# Patient Record
Sex: Female | Born: 1955 | Race: White | Hispanic: No | Smoking: Former smoker
Health system: Southern US, Community
[De-identification: ages and names within clinical notes are randomized; demographics above are authoritative.]

## PROBLEM LIST (undated history)

## (undated) DIAGNOSIS — T7840XA Allergy, unspecified, initial encounter: Secondary | ICD-10-CM

## (undated) DIAGNOSIS — F419 Anxiety disorder, unspecified: Secondary | ICD-10-CM

## (undated) DIAGNOSIS — K219 Gastro-esophageal reflux disease without esophagitis: Secondary | ICD-10-CM

## (undated) DIAGNOSIS — E079 Disorder of thyroid, unspecified: Secondary | ICD-10-CM

## (undated) DIAGNOSIS — G4730 Sleep apnea, unspecified: Secondary | ICD-10-CM

## (undated) DIAGNOSIS — J45909 Unspecified asthma, uncomplicated: Secondary | ICD-10-CM

## (undated) DIAGNOSIS — H269 Unspecified cataract: Secondary | ICD-10-CM

## (undated) DIAGNOSIS — J439 Emphysema, unspecified: Secondary | ICD-10-CM

## (undated) DIAGNOSIS — M109 Gout, unspecified: Secondary | ICD-10-CM

## (undated) DIAGNOSIS — E119 Type 2 diabetes mellitus without complications: Secondary | ICD-10-CM

## (undated) DIAGNOSIS — M1990 Unspecified osteoarthritis, unspecified site: Secondary | ICD-10-CM

## (undated) DIAGNOSIS — E039 Hypothyroidism, unspecified: Secondary | ICD-10-CM

## (undated) DIAGNOSIS — Z8719 Personal history of other diseases of the digestive system: Secondary | ICD-10-CM

## (undated) DIAGNOSIS — F32A Depression, unspecified: Secondary | ICD-10-CM

## (undated) DIAGNOSIS — E785 Hyperlipidemia, unspecified: Secondary | ICD-10-CM

## (undated) DIAGNOSIS — S8290XA Unspecified fracture of unspecified lower leg, initial encounter for closed fracture: Secondary | ICD-10-CM

## (undated) DIAGNOSIS — F329 Major depressive disorder, single episode, unspecified: Secondary | ICD-10-CM

## (undated) DIAGNOSIS — J449 Chronic obstructive pulmonary disease, unspecified: Secondary | ICD-10-CM

## (undated) HISTORY — DX: Major depressive disorder, single episode, unspecified: F32.9

## (undated) HISTORY — DX: Sleep apnea, unspecified: G47.30

## (undated) HISTORY — DX: Emphysema, unspecified: J43.9

## (undated) HISTORY — DX: Gout, unspecified: M10.9

## (undated) HISTORY — DX: Anxiety disorder, unspecified: F41.9

## (undated) HISTORY — DX: Depression, unspecified: F32.A

## (undated) HISTORY — DX: Disorder of thyroid, unspecified: E07.9

## (undated) HISTORY — DX: Unspecified osteoarthritis, unspecified site: M19.90

## (undated) HISTORY — DX: Unspecified asthma, uncomplicated: J45.909

## (undated) HISTORY — DX: Unspecified cataract: H26.9

## (undated) HISTORY — DX: Allergy, unspecified, initial encounter: T78.40XA

## (undated) HISTORY — DX: Chronic obstructive pulmonary disease, unspecified: J44.9

## (undated) HISTORY — DX: Hyperlipidemia, unspecified: E78.5

## (undated) HISTORY — DX: Type 2 diabetes mellitus without complications: E11.9

## (undated) HISTORY — DX: Gastro-esophageal reflux disease without esophagitis: K21.9

## (undated) HISTORY — DX: Unspecified fracture of unspecified lower leg, initial encounter for closed fracture: S82.90XA

## (undated) HISTORY — PX: HERNIA REPAIR: SHX51

## (undated) HISTORY — PX: PATELLA FRACTURE SURGERY: SHX735

## (undated) HISTORY — PX: TUBAL LIGATION: SHX77

---

## 1998-10-08 HISTORY — PX: ABDOMINAL HYSTERECTOMY: SHX81

## 2004-08-16 ENCOUNTER — Ambulatory Visit: Payer: Self-pay

## 2004-08-30 ENCOUNTER — Ambulatory Visit: Payer: Self-pay

## 2005-10-04 ENCOUNTER — Ambulatory Visit: Payer: Self-pay | Admitting: Internal Medicine

## 2005-10-08 DIAGNOSIS — S8290XA Unspecified fracture of unspecified lower leg, initial encounter for closed fracture: Secondary | ICD-10-CM

## 2005-10-08 HISTORY — DX: Unspecified fracture of unspecified lower leg, initial encounter for closed fracture: S82.90XA

## 2005-10-08 HISTORY — PX: LEG SURGERY: SHX1003

## 2006-06-25 ENCOUNTER — Emergency Department: Payer: Self-pay | Admitting: Emergency Medicine

## 2006-07-01 ENCOUNTER — Ambulatory Visit: Payer: Self-pay | Admitting: General Practice

## 2006-07-04 ENCOUNTER — Inpatient Hospital Stay: Payer: Self-pay | Admitting: General Practice

## 2007-09-18 ENCOUNTER — Ambulatory Visit: Payer: Self-pay | Admitting: Specialist

## 2007-11-25 ENCOUNTER — Ambulatory Visit: Payer: Self-pay

## 2008-12-09 ENCOUNTER — Ambulatory Visit: Payer: Self-pay | Admitting: Specialist

## 2010-04-04 ENCOUNTER — Ambulatory Visit: Payer: Self-pay

## 2010-05-30 ENCOUNTER — Ambulatory Visit: Payer: Self-pay | Admitting: Adult Health

## 2010-06-06 ENCOUNTER — Ambulatory Visit: Payer: Self-pay | Admitting: Cardiovascular Disease

## 2010-09-27 ENCOUNTER — Ambulatory Visit: Payer: Self-pay | Admitting: Adult Health

## 2011-04-12 ENCOUNTER — Ambulatory Visit: Payer: Self-pay | Admitting: "Endocrinology

## 2011-04-12 DIAGNOSIS — R0602 Shortness of breath: Secondary | ICD-10-CM

## 2011-04-19 ENCOUNTER — Ambulatory Visit: Payer: Self-pay | Admitting: "Endocrinology

## 2011-05-15 ENCOUNTER — Ambulatory Visit: Payer: Self-pay

## 2012-05-20 ENCOUNTER — Ambulatory Visit: Payer: Self-pay

## 2013-04-18 ENCOUNTER — Emergency Department: Payer: Self-pay | Admitting: Emergency Medicine

## 2013-04-18 LAB — COMPREHENSIVE METABOLIC PANEL
Albumin: 3.8 g/dL (ref 3.4–5.0)
Calcium, Total: 8.8 mg/dL (ref 8.5–10.1)
Creatinine: 1.21 mg/dL (ref 0.60–1.30)
EGFR (African American): 59 — ABNORMAL LOW
EGFR (Non-African Amer.): 51 — ABNORMAL LOW
Glucose: 152 mg/dL — ABNORMAL HIGH (ref 65–99)
SGOT(AST): 31 U/L (ref 15–37)
SGPT (ALT): 25 U/L (ref 12–78)
Sodium: 136 mmol/L (ref 136–145)
Total Protein: 7.8 g/dL (ref 6.4–8.2)

## 2013-04-18 LAB — CK TOTAL AND CKMB (NOT AT ARMC)
CK, Total: 351 U/L — ABNORMAL HIGH (ref 21–215)
CK-MB: 3.1 ng/mL (ref 0.5–3.6)

## 2013-04-18 LAB — CBC
HGB: 13.8 g/dL (ref 12.0–16.0)
MCH: 30.9 pg (ref 26.0–34.0)
MCV: 91 fL (ref 80–100)
Platelet: 241 10*3/uL (ref 150–440)

## 2013-10-12 ENCOUNTER — Ambulatory Visit: Payer: Self-pay

## 2013-10-20 ENCOUNTER — Ambulatory Visit: Payer: Self-pay

## 2014-04-19 ENCOUNTER — Ambulatory Visit: Payer: Self-pay

## 2014-06-29 DIAGNOSIS — G4730 Sleep apnea, unspecified: Secondary | ICD-10-CM | POA: Insufficient documentation

## 2014-08-12 ENCOUNTER — Emergency Department: Payer: Self-pay | Admitting: Student

## 2014-08-13 ENCOUNTER — Emergency Department: Payer: Self-pay | Admitting: Emergency Medicine

## 2014-08-31 LAB — CBC AND DIFFERENTIAL
HEMATOCRIT: 44 % (ref 36–46)
HEMOGLOBIN: 14.8 g/dL (ref 12.0–16.0)
Neutrophils Absolute: 4 /uL
Platelets: 340 10*3/uL (ref 150–399)
WBC: 8.3 10*3/mL

## 2014-08-31 LAB — HEPATIC FUNCTION PANEL
ALT: 17 U/L (ref 7–35)
AST: 22 U/L (ref 13–35)
Alkaline Phosphatase: 83 U/L (ref 25–125)
BILIRUBIN DIRECT: 0.1 mg/dL (ref 0.01–0.4)
BILIRUBIN, TOTAL: 0.2 mg/dL

## 2014-08-31 LAB — BASIC METABOLIC PANEL
BUN: 10 mg/dL (ref 4–21)
CREATININE: 0.8 mg/dL (ref 0.5–1.1)
GLUCOSE: 83 mg/dL
POTASSIUM: 4.3 mmol/L (ref 3.4–5.3)
SODIUM: 139 mmol/L (ref 137–147)

## 2014-08-31 LAB — LIPID PANEL
Cholesterol: 364 mg/dL — AB (ref 0–200)
HDL: 54 mg/dL (ref 35–70)
Triglycerides: 472 mg/dL — AB (ref 40–160)

## 2014-08-31 LAB — TSH: TSH: 59.6 u[IU]/mL — AB (ref 0.41–5.90)

## 2014-08-31 LAB — HEMOGLOBIN A1C: HEMOGLOBIN A1C: 6.5

## 2014-09-14 DIAGNOSIS — E119 Type 2 diabetes mellitus without complications: Secondary | ICD-10-CM | POA: Insufficient documentation

## 2014-10-28 DIAGNOSIS — L821 Other seborrheic keratosis: Secondary | ICD-10-CM | POA: Insufficient documentation

## 2014-11-11 DIAGNOSIS — E039 Hypothyroidism, unspecified: Secondary | ICD-10-CM | POA: Insufficient documentation

## 2014-11-11 DIAGNOSIS — M109 Gout, unspecified: Secondary | ICD-10-CM | POA: Insufficient documentation

## 2014-11-11 DIAGNOSIS — J449 Chronic obstructive pulmonary disease, unspecified: Secondary | ICD-10-CM | POA: Insufficient documentation

## 2014-12-09 DIAGNOSIS — M545 Low back pain, unspecified: Secondary | ICD-10-CM | POA: Insufficient documentation

## 2014-12-09 DIAGNOSIS — M549 Dorsalgia, unspecified: Secondary | ICD-10-CM | POA: Insufficient documentation

## 2015-12-13 ENCOUNTER — Telehealth: Payer: Self-pay

## 2015-12-13 NOTE — Telephone Encounter (Signed)
[facility] requesting refill for allopurinol  And Naproxen. Unable to authorize refill due to patein has not been seen at clinic in over a year. Patient needs to update eligibility and  then  schedule an appointment.

## 2015-12-15 NOTE — Telephone Encounter (Signed)
Lm letting know she must be seen before we can authorize refill

## 2016-02-21 DIAGNOSIS — E119 Type 2 diabetes mellitus without complications: Secondary | ICD-10-CM

## 2016-02-21 DIAGNOSIS — J449 Chronic obstructive pulmonary disease, unspecified: Secondary | ICD-10-CM

## 2016-02-21 DIAGNOSIS — M545 Low back pain: Secondary | ICD-10-CM

## 2016-02-21 DIAGNOSIS — E039 Hypothyroidism, unspecified: Secondary | ICD-10-CM

## 2016-02-21 DIAGNOSIS — M109 Gout, unspecified: Secondary | ICD-10-CM

## 2016-02-21 DIAGNOSIS — L821 Other seborrheic keratosis: Secondary | ICD-10-CM

## 2016-02-21 DIAGNOSIS — G4730 Sleep apnea, unspecified: Secondary | ICD-10-CM

## 2016-06-14 ENCOUNTER — Telehealth: Payer: Self-pay | Admitting: Nurse Practitioner

## 2016-06-14 NOTE — Telephone Encounter (Signed)
Unable to make contact with patient to inform her that [facility], due to a wrong number.

## 2016-06-19 ENCOUNTER — Ambulatory Visit: Payer: Self-pay | Admitting: Nurse Practitioner

## 2016-06-19 VITALS — BP 125/80 | HR 72 | Temp 91.6°F | Ht 61.0 in | Wt 186.5 lb

## 2016-06-19 DIAGNOSIS — R5382 Chronic fatigue, unspecified: Secondary | ICD-10-CM

## 2016-06-19 DIAGNOSIS — E782 Mixed hyperlipidemia: Secondary | ICD-10-CM

## 2016-06-19 DIAGNOSIS — R7309 Other abnormal glucose: Secondary | ICD-10-CM

## 2016-06-19 DIAGNOSIS — E039 Hypothyroidism, unspecified: Secondary | ICD-10-CM

## 2016-06-19 MED ORDER — ALBUTEROL SULFATE HFA 108 (90 BASE) MCG/ACT IN AERS: 2.0000 | INHALATION_SPRAY | Freq: Four times a day (QID) | RESPIRATORY_TRACT | 1 refills | Status: DC | PRN

## 2016-06-19 MED ORDER — FLUTICASONE-SALMETEROL 100-50 MCG/DOSE IN AEPB: 1.0000 | INHALATION_SPRAY | Freq: Two times a day (BID) | RESPIRATORY_TRACT | 1 refills | Status: DC

## 2016-06-19 MED ORDER — LEVOTHYROXINE SODIUM 200 MCG PO TABS: 200.0000 ug | ORAL_TABLET | Freq: Every day | ORAL | 1 refills | Status: DC

## 2016-06-19 MED ORDER — NAPROXEN 250 MG PO TABS: 250.0000 mg | ORAL_TABLET | Freq: Two times a day (BID) | ORAL | 0 refills | Status: DC

## 2016-06-19 NOTE — Progress Notes (Signed)
   Subjective:    Patient ID: Amy Knapp, female    DOB: [birth year], 58 y.o.   MRN: [id number]  HPI  Pt presents for f/u with hypothyroidism. Reports ran out of meds 3 months ago. Pt reports smoking 4 packs/day. Pt reports lower back pain from injury 2 years ago. Pt reports wheezing at night. Reports ran out of inhaler meds.   Patient Active Problem List   Diagnosis Date Noted  . Lower back pain 12/09/2014  . Hypothyroidism 11/11/2014  . COPD (chronic obstructive pulmonary disease) ([place]) 11/11/2014  . Gout 11/11/2014  . Seborrheic keratosis 10/28/2014  . Diabetes ([place]) 09/14/2014  . Sleep apnea 06/29/2014     Medication List       Accurate as of 06/19/16  8:17 PM. Always use your most recent med list.          Fluticasone-Salmeterol 100-50 MCG/DOSE Aepb Commonly known as:  ADVAIR Inhale 1 puff into the lungs 2 (two) times daily.   levothyroxine 200 MCG tablet Commonly known as:  SYNTHROID, LEVOTHROID Take 200 mcg by mouth daily before breakfast.   naproxen 250 MG tablet Commonly known as:  NAPROSYN Take by mouth 2 (two) times daily with a meal.   simvastatin 20 MG tablet Commonly known as:  ZOCOR Take 20 mg by mouth daily.   VENTOLIN HFA 108 (90 Base) MCG/ACT inhaler Generic drug:  albuterol Inhale 2 puffs into the lungs every 6 (six) hours as needed for wheezing or shortness of breath.         Review of Systems   No adenopathy  Carotid bruits not present BBrS clear though diminished as expected in long time smoker APRR trace to 1+ pitting edema ending below 1 shin level    Objective:   Physical Exam  Constitutional: She is oriented to person, place, and time.  Cardiovascular: Normal rate, regular rhythm and normal heart sounds.   Neurological: She is alert and oriented to person, place, and time.    BP 125/80   Pulse 72   Temp (!) 91.6 F (33.1 C) (Oral)   Ht _0  (1.549 m)   Wt 186 lb 8 oz (84.6 kg)   BMI 35.24 kg/m         Assessment & Plan:  F/u in 2 weeks w/ labs done prior Labs ASAP: TSH, Lipid, A1C, Met C, CBC Likely prescribe Lipitor Referral to orthopedics and eye exam

## 2016-06-19 NOTE — Patient Instructions (Signed)
F/u in 2 weeks w/ labs done prior Labs ASAP: TSH, Lipid, A1C, Met C, CBC Referral to orthopedics and for eye exam

## 2016-06-21 ENCOUNTER — Other Ambulatory Visit: Payer: Self-pay

## 2016-06-21 DIAGNOSIS — E782 Mixed hyperlipidemia: Secondary | ICD-10-CM

## 2016-06-21 DIAGNOSIS — E039 Hypothyroidism, unspecified: Secondary | ICD-10-CM

## 2016-06-21 DIAGNOSIS — R5382 Chronic fatigue, unspecified: Secondary | ICD-10-CM

## 2016-06-21 DIAGNOSIS — R7309 Other abnormal glucose: Secondary | ICD-10-CM

## 2016-06-23 LAB — CBC WITH DIFFERENTIAL/PLATELET
BASOS: 1 %
Basophils Absolute: 0 10*3/uL (ref 0.0–0.2)
EOS (ABSOLUTE): 0.1 10*3/uL (ref 0.0–0.4)
Eos: 2 %
Hematocrit: 40 % (ref 34.0–46.6)
Hemoglobin: 13.5 g/dL (ref 11.1–15.9)
Immature Grans (Abs): 0 10*3/uL (ref 0.0–0.1)
Immature Granulocytes: 0 %
LYMPHS ABS: 3.5 10*3/uL — AB (ref 0.7–3.1)
Lymphs: 52 %
MCH: 30.6 pg (ref 26.6–33.0)
MCHC: 33.8 g/dL (ref 31.5–35.7)
MCV: 91 fL (ref 79–97)
MONOS ABS: 0.5 10*3/uL (ref 0.1–0.9)
Monocytes: 7 %
NEUTROS ABS: 2.5 10*3/uL (ref 1.4–7.0)
Neutrophils: 38 %
PLATELETS: 283 10*3/uL (ref 150–379)
RBC: 4.41 x10E6/uL (ref 3.77–5.28)
RDW: 13.9 % (ref 12.3–15.4)
WBC: 6.7 10*3/uL (ref 3.4–10.8)

## 2016-06-23 LAB — COMPREHENSIVE METABOLIC PANEL
A/G RATIO: 1.7 (ref 1.2–2.2)
ALT: 25 IU/L (ref 0–32)
AST: 48 IU/L — AB (ref 0–40)
Albumin: 4.6 g/dL (ref 3.5–5.5)
Alkaline Phosphatase: 82 IU/L (ref 39–117)
BILIRUBIN TOTAL: 0.3 mg/dL (ref 0.0–1.2)
BUN/Creatinine Ratio: 13 (ref 9–23)
BUN: 14 mg/dL (ref 6–24)
CHLORIDE: 97 mmol/L (ref 96–106)
CO2: 26 mmol/L (ref 18–29)
Calcium: 9.6 mg/dL (ref 8.7–10.2)
Creatinine, Ser: 1.04 mg/dL — ABNORMAL HIGH (ref 0.57–1.00)
GFR calc non Af Amer: 60 mL/min/{1.73_m2} (ref >59)
GFR, EST AFRICAN AMERICAN: 69 mL/min/{1.73_m2} (ref >59)
Globulin, Total: 2.7 g/dL (ref 1.5–4.5)
Glucose: 88 mg/dL (ref 65–99)
POTASSIUM: 4.1 mmol/L (ref 3.5–5.2)
Sodium: 141 mmol/L (ref 134–144)
TOTAL PROTEIN: 7.3 g/dL (ref 6.0–8.5)

## 2016-06-23 LAB — LIPID PANEL
Chol/HDL Ratio: 6.3 ratio units — ABNORMAL HIGH (ref 0.0–4.4)
Cholesterol, Total: 480 mg/dL — ABNORMAL HIGH (ref 100–199)
HDL: 76 mg/dL (ref >39)
LDL Calculated: 358 mg/dL — ABNORMAL HIGH (ref 0–99)
Triglycerides: 229 mg/dL — ABNORMAL HIGH (ref 0–149)
VLDL Cholesterol Cal: 46 mg/dL — ABNORMAL HIGH (ref 5–40)

## 2016-06-23 LAB — HEMOGLOBIN A1C
ESTIMATED AVERAGE GLUCOSE: 131 mg/dL
HEMOGLOBIN A1C: 6.2 % — AB (ref 4.8–5.6)

## 2016-06-23 LAB — TSH: TSH: 71.88 u[IU]/mL — ABNORMAL HIGH (ref 0.450–4.500)

## 2016-07-03 ENCOUNTER — Ambulatory Visit: Payer: Self-pay | Admitting: Nurse Practitioner

## 2016-07-03 VITALS — BP 139/69 | HR 84 | Temp 98.7°F | Ht 61.0 in | Wt 185.0 lb

## 2016-07-03 DIAGNOSIS — E782 Mixed hyperlipidemia: Secondary | ICD-10-CM

## 2016-07-03 DIAGNOSIS — E039 Hypothyroidism, unspecified: Secondary | ICD-10-CM

## 2016-07-03 DIAGNOSIS — R7303 Prediabetes: Secondary | ICD-10-CM

## 2016-07-03 LAB — GLUCOSE, POCT (MANUAL RESULT ENTRY): POC GLUCOSE: 90 mg/dL (ref 70–99)

## 2016-07-03 MED ORDER — ATORVASTATIN CALCIUM 20 MG PO TABS: 20.0000 mg | ORAL_TABLET | Freq: Every day | ORAL | 3 refills | Status: DC

## 2016-07-03 MED ORDER — ALLOPURINOL 100 MG PO TABS: 100.0000 mg | ORAL_TABLET | Freq: Every day | ORAL | 3 refills | Status: DC

## 2016-07-03 MED ORDER — FLUTICASONE-SALMETEROL 100-50 MCG/DOSE IN AEPB: 1.0000 | INHALATION_SPRAY | Freq: Two times a day (BID) | RESPIRATORY_TRACT | 1 refills | Status: DC

## 2016-07-03 MED ORDER — HYDROCHLOROTHIAZIDE 12.5 MG PO CAPS: 12.5000 mg | ORAL_CAPSULE | Freq: Every day | ORAL | 3 refills | Status: DC

## 2016-07-03 MED ORDER — ALBUTEROL SULFATE HFA 108 (90 BASE) MCG/ACT IN AERS: 2.0000 | INHALATION_SPRAY | Freq: Four times a day (QID) | RESPIRATORY_TRACT | 1 refills | Status: DC | PRN

## 2016-07-03 NOTE — Progress Notes (Signed)
Upon review of chart, noted total cholesterol at 400, and LDL at 358  Pt states she had been out of medication for approx 3.5 months.    Had been on simvastatin, 20 mg,    Is concerned Re;  BLE edema, is working 58 hours per week [employer], mostly standing, and has had significant lower extremity edema.    Exam:   L upper back, large seborrheic kerotsis  Thyroid is tender to poalpation No adenopathy Carotid bruits not present  BBrS clear AP RRR BLE, with approx 2+ edema, ending below the knee.    Assessment/plan  In 6 weeks will recheck liver and renal panel to make sure no problems from then atorvastatin, and as creatinine was slightly elevated, will recheck.  Renal panel  Blood count ok,   tsh at approx 72, but just restarted her levothyroxine approx 2 weeks ago, so will recheck tsh at 6 week point.    Have instructed pt it is unacceptable to run out of her meds, instructed she must come to clinic or call at least 3-4 weeks before her meds are out.    Discussed risk of chf, with not taking levothyroxine, and risk of heart attack or stroke if lipids are out of control  Seborrheic keratosis, pt reassured benign and will never become malignant.    BLE edema will add 12.5 mg of hctz, but pt really needs support socks or stockings.    Gout, will renew her allopurinol

## 2016-07-03 NOTE — Patient Instructions (Addendum)
Pt is to have labs drawn in 6 weeks, and rtc to clinic after results are available.

## 2016-07-10 ENCOUNTER — Telehealth: Payer: Self-pay

## 2016-07-10 NOTE — Telephone Encounter (Signed)
Need to reschedule eye appt on Thursday. No number in computer.

## 2016-07-12 ENCOUNTER — Ambulatory Visit: Payer: Self-pay | Admitting: Ophthalmology

## 2016-08-02 ENCOUNTER — Ambulatory Visit: Payer: Self-pay | Admitting: Ophthalmology

## 2016-08-14 ENCOUNTER — Ambulatory Visit: Payer: Self-pay | Admitting: Adult Health Nurse Practitioner

## 2016-08-14 VITALS — BP 126/69 | HR 82 | Temp 97.5°F | Wt 181.0 lb

## 2016-08-14 DIAGNOSIS — E782 Mixed hyperlipidemia: Secondary | ICD-10-CM

## 2016-08-14 DIAGNOSIS — E785 Hyperlipidemia, unspecified: Secondary | ICD-10-CM | POA: Insufficient documentation

## 2016-08-14 DIAGNOSIS — E1169 Type 2 diabetes mellitus with other specified complication: Secondary | ICD-10-CM | POA: Insufficient documentation

## 2016-08-14 DIAGNOSIS — E039 Hypothyroidism, unspecified: Secondary | ICD-10-CM

## 2016-08-14 DIAGNOSIS — M545 Low back pain: Secondary | ICD-10-CM

## 2016-08-14 DIAGNOSIS — R600 Localized edema: Secondary | ICD-10-CM | POA: Insufficient documentation

## 2016-08-14 MED ORDER — NAPROXEN 250 MG PO TABS: 250.0000 mg | ORAL_TABLET | Freq: Two times a day (BID) | ORAL | 0 refills | Status: DC

## 2016-08-14 MED ORDER — LEVOTHYROXINE SODIUM 200 MCG PO TABS: 200.0000 ug | ORAL_TABLET | Freq: Every day | ORAL | 1 refills | Status: DC

## 2016-08-14 NOTE — Progress Notes (Signed)
Patient: Amy Knapp Female    DOB: [birth year]   60 y.o.   MRN: [id number] Visit Date: 08/14/2016  Today's Provider: [facility]   Chief Complaint  Patient presents with  . Back Pain    R. lower back pain, doesnt not remember trauma, job is lots of standing  . Follow-up   Subjective:    HPI  R lower back pain, denies trauma.  Just started today.  Has not taken anything for it.  Pain rated 5/10-thinks could be related to standing at work.   Last visit HCTZ was added for BLE.  Has noticed some decreased swelling.  Not wearing support hose.     Reports taking levothyroxine as directed.  Needs to repeat TSH today.   Started on Atorvastatin at last OV. Reports taking as directed.  Will check LFT for stability.  Denies myalgias.   Allergies  Allergen Reactions  . Celery Oil   . Demerol [Meperidine]   . Morphine And Related Other (See Comments)    Muscle Spasms  . Penicillins    Previous Medications   ALBUTEROL (VENTOLIN HFA) 108 (90 BASE) MCG/ACT INHALER    Inhale 2 puffs into the lungs every 6 (six) hours as needed for wheezing or shortness of breath.   ALLOPURINOL (ZYLOPRIM) 100 MG TABLET    Take 1 tablet (100 mg total) by mouth daily.   ATORVASTATIN (LIPITOR) 20 MG TABLET    Take 1 tablet (20 mg total) by mouth daily.   FLUTICASONE-SALMETEROL (ADVAIR) 100-50 MCG/DOSE AEPB    Inhale 1 puff into the lungs 2 (two) times daily.   HYDROCHLOROTHIAZIDE (MICROZIDE) 12.5 MG CAPSULE    Take 1 capsule (12.5 mg total) by mouth daily.   LEVOTHYROXINE (SYNTHROID, LEVOTHROID) 200 MCG TABLET    Take 1 tablet (200 mcg total) by mouth daily before breakfast.   NAPROXEN (NAPROSYN) 250 MG TABLET    Take 1 tablet (250 mg total) by mouth 2 (two) times daily with a meal.    Review of Systems  All other systems reviewed and are negative.   Social History  Substance Use Topics  . Smoking status: Current Every Day Smoker    Packs/day: 0.25    Years: 15.00   Types: Cigarettes  . Smokeless tobacco: Never Used  . Alcohol use No   Objective:   BP 126/69   Pulse 82   Temp 97.5 F (36.4 C)   Wt 181 lb (82.1 kg)   SpO2 93%   BMI 34.20 kg/m   Physical Exam  Constitutional: She is oriented to person, place, and time. She appears well-developed and well-nourished.  HENT:  Head: Normocephalic and atraumatic.  Eyes: Pupils are equal, round, and reactive to light.  Neck: Normal range of motion. Neck supple.  Cardiovascular: Normal rate, regular rhythm and normal heart sounds.   Trace BLE edema.   Pulmonary/Chest: Effort normal and breath sounds normal.  Abdominal: Soft. Bowel sounds are normal.  Neurological: She is oriented to person, place, and time.  Skin: Skin is warm and dry.  Psychiatric: She has a normal mood and affect. Her behavior is normal.  Vitals reviewed.       Assessment & Plan:          Check TSH Refill levothyroxine and naproxen.   LBP:  Take Naproxen tonight.  Moist heat.  Stretch back muscles.  BLE edema:  Recommend support hose.  Elevate legs when sitting or lying.  Continue HCTZ 12.5.  HLD:  Continue Atorvastatin.  Monitor lipids at next ov.  Check LFT today.  FU in 3 months for routine visit.    [facility]   [facility]

## 2016-08-16 ENCOUNTER — Other Ambulatory Visit: Payer: Self-pay

## 2016-08-16 DIAGNOSIS — E782 Mixed hyperlipidemia: Secondary | ICD-10-CM

## 2016-08-17 LAB — COMPREHENSIVE METABOLIC PANEL
A/G RATIO: 1.4 (ref 1.2–2.2)
ALBUMIN: 3.7 g/dL (ref 3.5–5.5)
ALT: 35 IU/L — AB (ref 0–32)
AST: 30 IU/L (ref 0–40)
Alkaline Phosphatase: 73 IU/L (ref 39–117)
BUN/Creatinine Ratio: 13 (ref 9–23)
BUN: 11 mg/dL (ref 6–24)
Bilirubin Total: 0.3 mg/dL (ref 0.0–1.2)
CALCIUM: 9.8 mg/dL (ref 8.7–10.2)
CO2: 31 mmol/L — ABNORMAL HIGH (ref 18–29)
Chloride: 102 mmol/L (ref 96–106)
Creatinine, Ser: 0.85 mg/dL (ref 0.57–1.00)
GFR, EST AFRICAN AMERICAN: 88 mL/min/{1.73_m2} (ref >59)
GFR, EST NON AFRICAN AMERICAN: 76 mL/min/{1.73_m2} (ref >59)
GLOBULIN, TOTAL: 2.6 g/dL (ref 1.5–4.5)
Glucose: 121 mg/dL — ABNORMAL HIGH (ref 65–99)
POTASSIUM: 3.8 mmol/L (ref 3.5–5.2)
SODIUM: 145 mmol/L — AB (ref 134–144)
TOTAL PROTEIN: 6.3 g/dL (ref 6.0–8.5)

## 2016-08-17 LAB — LIPID PANEL
CHOLESTEROL TOTAL: 146 mg/dL (ref 100–199)
Chol/HDL Ratio: 3 ratio units (ref 0.0–4.4)
HDL: 48 mg/dL (ref >39)
LDL Calculated: 62 mg/dL (ref 0–99)
TRIGLYCERIDES: 182 mg/dL — AB (ref 0–149)
VLDL Cholesterol Cal: 36 mg/dL (ref 5–40)

## 2016-08-17 LAB — HEMOGLOBIN A1C
Est. average glucose Bld gHb Est-mCnc: 120 mg/dL
Hgb A1c MFr Bld: 5.8 % — ABNORMAL HIGH (ref 4.8–5.6)

## 2016-08-22 ENCOUNTER — Telehealth: Payer: Self-pay

## 2016-08-22 NOTE — Telephone Encounter (Signed)
No phone number available to call pt. Will send letter.

## 2016-09-20 ENCOUNTER — Telehealth: Payer: Self-pay

## 2016-09-20 NOTE — Telephone Encounter (Signed)
Pt called in wanting a refill on her thyroid medication levothyroxine. I looked it up and she has another refill, [facility]. Tried calling number just rang no answer.

## 2016-10-16 ENCOUNTER — Other Ambulatory Visit: Payer: Self-pay | Admitting: Nurse Practitioner

## 2016-11-01 ENCOUNTER — Other Ambulatory Visit: Payer: Self-pay

## 2016-11-01 MED ORDER — NAPROXEN 250 MG PO TABS: 250.0000 mg | ORAL_TABLET | Freq: Two times a day (BID) | ORAL | 0 refills | Status: DC

## 2016-11-01 MED ORDER — LEVOTHYROXINE SODIUM 200 MCG PO TABS: 200.0000 ug | ORAL_TABLET | Freq: Every day | ORAL | 1 refills | Status: DC

## 2016-11-01 NOTE — Telephone Encounter (Signed)
Patient came in requesting refills on naproxen and levothyroxine.  Pharmacy of choice [facility].

## 2016-11-13 ENCOUNTER — Ambulatory Visit: Payer: Self-pay | Admitting: Adult Health Nurse Practitioner

## 2016-11-13 VITALS — BP 129/83 | HR 96 | Wt 183.0 lb

## 2016-11-13 DIAGNOSIS — E119 Type 2 diabetes mellitus without complications: Secondary | ICD-10-CM

## 2016-11-13 DIAGNOSIS — E039 Hypothyroidism, unspecified: Secondary | ICD-10-CM

## 2016-11-13 DIAGNOSIS — E782 Mixed hyperlipidemia: Secondary | ICD-10-CM

## 2016-11-13 DIAGNOSIS — Z23 Encounter for immunization: Secondary | ICD-10-CM | POA: Insufficient documentation

## 2016-11-13 LAB — GLUCOSE, POCT (MANUAL RESULT ENTRY): POC Glucose: 177 mg/dl — AB (ref 70–99)

## 2016-11-13 MED ORDER — MELOXICAM 7.5 MG PO TABS: 7.5000 mg | ORAL_TABLET | Freq: Every day | ORAL | 2 refills | Status: DC

## 2016-11-13 NOTE — Progress Notes (Signed)
  Patient: Amy Pickethelma Marlene Knapp Female    DOB: 1955/10/18   61 y.o.   MRN: [id number] Visit Date: 11/13/2016  Today's Provider: Jacelyn Pieah Doles-Johnson, NP   Chief Complaint  Patient presents with  . Fatigue  . Pain   Subjective:    HPI     HLD:  Taking medications as directed.  Last LDL: 62 TG elevated at 182.   Hypothyroidism: Taking medications as directed. Last TSH 71.   HCTZ for BLE edema.  Pt states that edema has resolved unless she stands for prolonged periods.  Last A1C was 5.8 in Nov 2017.      Allergies  Allergen Reactions  . Celery Oil   . Demerol [Meperidine]   . Morphine And Related Other (See Comments)    Muscle Spasms  . Penicillins    Previous Medications   ALBUTEROL (VENTOLIN HFA) 108 (90 BASE) MCG/ACT INHALER    Inhale 2 puffs into the lungs every 6 (six) hours as needed for wheezing or shortness of breath.   ALLOPURINOL (ZYLOPRIM) 100 MG TABLET    Take 1 tablet by mouth daily.   ATORVASTATIN (LIPITOR) 20 MG TABLET    Take 1 tablet by mouth daily.   FLUTICASONE-SALMETEROL (ADVAIR) 100-50 MCG/DOSE AEPB    Inhale 1 puff into the lungs 2 (two) times daily.   HYDROCHLOROTHIAZIDE (MICROZIDE) 12.5 MG CAPSULE    Take 1 capsule by mouth daily.   LEVOTHYROXINE (SYNTHROID, LEVOTHROID) 200 MCG TABLET    Take 1 tablet (200 mcg total) by mouth daily before breakfast.   NAPROXEN (NAPROSYN) 250 MG TABLET    Take 1 tablet (250 mg total) by mouth 2 (two) times daily with a meal.    Review of Systems  All other systems reviewed and are negative.   Social History  Substance Use Topics  . Smoking status: Current Every Day Smoker    Packs/day: 0.25    Years: 15.00    Types: Cigarettes  . Smokeless tobacco: Never Used  . Alcohol use No   Objective:   BP 129/83   Pulse 96   Wt 183 lb (83 kg)   BMI 34.58 kg/m   Physical Exam  Constitutional: She is oriented to person, place, and time. She appears well-developed and well-nourished.  HENT:  Head:  Normocephalic and atraumatic.  Eyes: Pupils are equal, round, and reactive to light.  Neck: Normal range of motion. Neck supple.  Cardiovascular: Normal rate, regular rhythm, normal heart sounds and intact distal pulses.   Pulmonary/Chest: Effort normal and breath sounds normal.  Abdominal: Soft. Bowel sounds are normal.  Musculoskeletal: She exhibits no edema.  Neurological: She is alert and oriented to person, place, and time.  Skin: Skin is warm and dry.  Psychiatric: She has a normal mood and affect.  Vitals reviewed.       Assessment & Plan:         HLD:  Controlled.   Continue current regimen.  Encourage low cholesterol, low fat diet and exercise.   Hypothyroidism:  Check TSH today.  Continue current medication regimen.   Stop Naproxen will change Meloxicam.  Encourage stretching daily.  Ice/heat in rotation.    Jacelyn Pieah Doles-Johnson, NP   [facility] [place]

## 2016-11-14 LAB — TSH: TSH: 0.289 u[IU]/mL — AB (ref 0.450–4.500)

## 2016-11-15 ENCOUNTER — Other Ambulatory Visit: Payer: Self-pay | Admitting: Adult Health Nurse Practitioner

## 2016-11-15 DIAGNOSIS — E039 Hypothyroidism, unspecified: Secondary | ICD-10-CM

## 2016-11-15 MED ORDER — LEVOTHYROXINE SODIUM 175 MCG PO TABS: 175.0000 ug | ORAL_TABLET | Freq: Every day | ORAL | 2 refills | Status: DC

## 2016-11-20 ENCOUNTER — Telehealth: Payer: Self-pay

## 2016-11-20 NOTE — Telephone Encounter (Signed)
Called pt with results from doctor. Number is busy the past few times I have called. Will continue to try.

## 2016-11-20 NOTE — Telephone Encounter (Signed)
-----   Message from Jacelyn Pieah Doles-Johnson, NP sent at 11/15/2016  5:47 PM EST ----- Thyroid test indicates thyroid is overactive. Decrease Synthroid to 175mcg daily. Recheck level in 8 weeks.

## 2016-12-11 ENCOUNTER — Telehealth: Payer: Self-pay

## 2016-12-11 NOTE — Telephone Encounter (Signed)
Received PAP application from [facility] for Advair and Ventolin placed for provider to sign. 

## 2016-12-27 NOTE — Telephone Encounter (Signed)
Placed signed application/script in [facility] folder for pickup. 

## 2017-01-10 ENCOUNTER — Telehealth: Payer: Self-pay | Admitting: Pharmacist

## 2017-01-10 NOTE — Telephone Encounter (Signed)
01/10/17 I have received scripts back from [facility] for Ventolin & Advair 100/50-patient has not returned her portion and eligibility packet with income/support & taxes/4506T. This information was mailed to patient 11/28/16, and we previously sent form to patient 06/25/2016 to sign and return, never received back. Unable to order meds until information is received.

## 2017-01-15 ENCOUNTER — Ambulatory Visit: Payer: Self-pay

## 2017-01-15 ENCOUNTER — Other Ambulatory Visit: Payer: Self-pay

## 2017-01-15 DIAGNOSIS — E039 Hypothyroidism, unspecified: Secondary | ICD-10-CM

## 2017-01-16 LAB — TSH: TSH: <0.006 u[IU]/mL — ABNORMAL LOW (ref 0.450–4.500)

## 2017-01-29 ENCOUNTER — Ambulatory Visit: Payer: Self-pay | Admitting: Adult Health Nurse Practitioner

## 2017-01-29 VITALS — BP 127/73 | HR 94 | Temp 98.4°F | Wt 182.6 lb

## 2017-01-29 DIAGNOSIS — E039 Hypothyroidism, unspecified: Secondary | ICD-10-CM

## 2017-01-29 DIAGNOSIS — E782 Mixed hyperlipidemia: Secondary | ICD-10-CM

## 2017-01-29 DIAGNOSIS — E119 Type 2 diabetes mellitus without complications: Secondary | ICD-10-CM

## 2017-01-29 LAB — GLUCOSE, POCT (MANUAL RESULT ENTRY): POC GLUCOSE: 163 mg/dL — AB (ref 70–99)

## 2017-01-29 MED ORDER — ALBUTEROL SULFATE HFA 108 (90 BASE) MCG/ACT IN AERS: 2.0000 | INHALATION_SPRAY | Freq: Four times a day (QID) | RESPIRATORY_TRACT | 1 refills | Status: DC | PRN

## 2017-01-29 MED ORDER — ATORVASTATIN CALCIUM 20 MG PO TABS: 20.0000 mg | ORAL_TABLET | Freq: Every day | ORAL | 1 refills | Status: DC

## 2017-01-29 MED ORDER — ALLOPURINOL 100 MG PO TABS: 100.0000 mg | ORAL_TABLET | Freq: Every day | ORAL | 0 refills | Status: DC

## 2017-01-29 MED ORDER — HYDROCHLOROTHIAZIDE 12.5 MG PO CAPS: 12.5000 mg | ORAL_CAPSULE | Freq: Every day | ORAL | 0 refills | Status: DC

## 2017-01-29 MED ORDER — MELOXICAM 7.5 MG PO TABS: 7.5000 mg | ORAL_TABLET | Freq: Every day | ORAL | 0 refills | Status: DC

## 2017-01-29 MED ORDER — LEVOTHYROXINE SODIUM 150 MCG PO TABS: 150.0000 ug | ORAL_TABLET | Freq: Every day | ORAL | 1 refills | Status: DC

## 2017-01-29 NOTE — Progress Notes (Signed)
  Patient: Amy Knapp Female    DOB: [birth year]   61 y.o.   MRN: [id number] Visit Date: 01/29/2017  Today's Provider: Jacelyn Pi, NP   Chief Complaint  Patient presents with  . Results   Subjective:    HPI   Would like medications to be sent to [facility] too far to drive.   TSH: <0.006 Taking Levothyroxine daily.      Allergies  Allergen Reactions  . Celery Oil   . Demerol [Meperidine]   . Morphine And Related Other (See Comments)    Muscle Spasms  . Penicillins    Previous Medications   ALBUTEROL (VENTOLIN HFA) 108 (90 BASE) MCG/ACT INHALER    Inhale 2 puffs into the lungs every 6 (six) hours as needed for wheezing or shortness of breath.   ALLOPURINOL (ZYLOPRIM) 100 MG TABLET    Take 1 tablet by mouth daily.   ATORVASTATIN (LIPITOR) 20 MG TABLET    Take 1 tablet by mouth daily.   FLUTICASONE-SALMETEROL (ADVAIR) 100-50 MCG/DOSE AEPB    Inhale 1 puff into the lungs 2 (two) times daily.   HYDROCHLOROTHIAZIDE (MICROZIDE) 12.5 MG CAPSULE    Take 1 capsule by mouth daily.   LEVOTHYROXINE (SYNTHROID, LEVOTHROID) 175 MCG TABLET    Take 1 tablet (175 mcg total) by mouth daily before breakfast.   MELOXICAM (MOBIC) 7.5 MG TABLET    Take 1 tablet (7.5 mg total) by mouth daily.    Review of Systems  All other systems reviewed and are negative.   Social History  Substance Use Topics  . Smoking status: Current Every Day Smoker    Packs/day: 0.25    Years: 15.00    Types: Cigarettes  . Smokeless tobacco: Never Used  . Alcohol use No   Objective:   BP 127/73   Pulse 94   Temp 98.4 F (36.9 C)   Wt 182 lb 9.6 oz (82.8 kg)   BMI 34.50 kg/m   Physical Exam  Constitutional: She appears well-developed and well-nourished.  HENT:  Head: Normocephalic and atraumatic.  Neck: Normal range of motion. Neck supple. No thyromegaly present.  Cardiovascular: Normal rate, regular rhythm and normal heart sounds.    Pulmonary/Chest: Effort normal and breath sounds normal.  Vitals reviewed.       Assessment & Plan:      Decrease Levothyroxine to daily.  Take in the am with no other medications.  FU as directed in June.  Repeat TSH on May labs.        Jacelyn Pi, NP   [facility]

## 2017-03-07 ENCOUNTER — Other Ambulatory Visit: Payer: Self-pay

## 2017-03-07 DIAGNOSIS — E039 Hypothyroidism, unspecified: Secondary | ICD-10-CM

## 2017-03-07 DIAGNOSIS — E782 Mixed hyperlipidemia: Secondary | ICD-10-CM

## 2017-03-07 DIAGNOSIS — E119 Type 2 diabetes mellitus without complications: Secondary | ICD-10-CM

## 2017-03-08 LAB — LIPID PANEL
CHOL/HDL RATIO: 5 ratio — AB (ref 0.0–4.4)
Cholesterol, Total: 243 mg/dL — ABNORMAL HIGH (ref 100–199)
HDL: 49 mg/dL (ref >39)
LDL CALC: 144 mg/dL — AB (ref 0–99)
TRIGLYCERIDES: 250 mg/dL — AB (ref 0–149)
VLDL Cholesterol Cal: 50 mg/dL — ABNORMAL HIGH (ref 5–40)

## 2017-03-08 LAB — COMPREHENSIVE METABOLIC PANEL
A/G RATIO: 1.7 (ref 1.2–2.2)
ALT: 24 IU/L (ref 0–32)
AST: 22 IU/L (ref 0–40)
Albumin: 4.3 g/dL (ref 3.5–5.5)
Alkaline Phosphatase: 92 IU/L (ref 39–117)
BUN/Creatinine Ratio: 13 (ref 9–23)
BUN: 10 mg/dL (ref 6–24)
CALCIUM: 9.8 mg/dL (ref 8.7–10.2)
CO2: 26 mmol/L (ref 18–29)
Chloride: 101 mmol/L (ref 96–106)
Creatinine, Ser: 0.78 mg/dL (ref 0.57–1.00)
GFR, EST AFRICAN AMERICAN: 97 mL/min/{1.73_m2} (ref >59)
GFR, EST NON AFRICAN AMERICAN: 84 mL/min/{1.73_m2} (ref >59)
GLOBULIN, TOTAL: 2.5 g/dL (ref 1.5–4.5)
Glucose: 132 mg/dL — ABNORMAL HIGH (ref 65–99)
POTASSIUM: 4.4 mmol/L (ref 3.5–5.2)
SODIUM: 142 mmol/L (ref 134–144)
TOTAL PROTEIN: 6.8 g/dL (ref 6.0–8.5)

## 2017-03-08 LAB — HEMOGLOBIN A1C
Est. average glucose Bld gHb Est-mCnc: 137 mg/dL
Hgb A1c MFr Bld: 6.4 % — ABNORMAL HIGH (ref 4.8–5.6)

## 2017-03-08 LAB — TSH: TSH: 1.55 u[IU]/mL (ref 0.450–4.500)

## 2017-03-12 ENCOUNTER — Ambulatory Visit: Payer: Self-pay | Admitting: Adult Health Nurse Practitioner

## 2017-03-12 VITALS — BP 119/76 | HR 88 | Temp 99.2°F | Ht 61.0 in | Wt 181.1 lb

## 2017-03-12 DIAGNOSIS — J449 Chronic obstructive pulmonary disease, unspecified: Secondary | ICD-10-CM

## 2017-03-12 DIAGNOSIS — E119 Type 2 diabetes mellitus without complications: Secondary | ICD-10-CM

## 2017-03-12 DIAGNOSIS — E039 Hypothyroidism, unspecified: Secondary | ICD-10-CM

## 2017-03-12 DIAGNOSIS — E782 Mixed hyperlipidemia: Secondary | ICD-10-CM

## 2017-03-12 LAB — GLUCOSE, POCT (MANUAL RESULT ENTRY): POC Glucose: 101 mg/dL — AB (ref 70–99)

## 2017-03-12 MED ORDER — ALBUTEROL SULFATE HFA 108 (90 BASE) MCG/ACT IN AERS: 2.0000 | INHALATION_SPRAY | Freq: Four times a day (QID) | RESPIRATORY_TRACT | 1 refills | Status: DC | PRN

## 2017-03-12 NOTE — Progress Notes (Signed)
  Patient: Amy Knapp Female    DOB: January 27, 1956   61 y.o.   MRN: [id number] Visit Date: 03/12/2017  Today's Provider: Jacelyn Pieah Doles-Johnson, NP   Chief Complaint  Patient presents with  . Follow-up   Subjective:    HPI  Reviewed labs.   HLD:  LDL 144.  Taking medications as directed. Stating she eats what she wants.   Hypothyroidism:  Last visit Levothyroxine increased to 150mcg.  TCH 1.550 currently.   HTN:  Taking medications as directed.     Allergies  Allergen Reactions  . Celery Oil   . Demerol [Meperidine]   . Morphine And Related Other (See Comments)    Muscle Spasms  . Penicillins    Previous Medications   ALBUTEROL (VENTOLIN HFA) 108 (90 BASE) MCG/ACT INHALER    Inhale 2 puffs into the lungs every 6 (six) hours as needed for wheezing or shortness of breath.   ALLOPURINOL (ZYLOPRIM) 100 MG TABLET    Take 1 tablet (100 mg total) by mouth daily.   ATORVASTATIN (LIPITOR) 20 MG TABLET    Take 1 tablet (20 mg total) by mouth daily.   FLUTICASONE-SALMETEROL (ADVAIR) 100-50 MCG/DOSE AEPB    Inhale 1 puff into the lungs 2 (two) times daily.   HYDROCHLOROTHIAZIDE (MICROZIDE) 12.5 MG CAPSULE    Take 1 capsule (12.5 mg total) by mouth daily.   LEVOTHYROXINE (SYNTHROID) 150 MCG TABLET    Take 1 tablet (150 mcg total) by mouth daily before breakfast.   MELOXICAM (MOBIC) 7.5 MG TABLET    Take 1 tablet (7.5 mg total) by mouth daily.    Review of Systems  All other systems reviewed and are negative.   Social History  Substance Use Topics  . Smoking status: Current Every Day Smoker    Packs/day: 0.25    Years: 15.00    Types: Cigarettes  . Smokeless tobacco: Never Used  . Alcohol use No   Objective:   BP 119/76   Pulse 88   Temp 99.2 F (37.3 C)   Ht 5\' 1"  (1.549 m)   Wt 181 lb 1.6 oz (82.1 kg)   BMI 34.22 kg/m   Physical Exam  Constitutional: She is oriented to person, place, and time. She appears well-developed and well-nourished.  HENT:  Head:  Normocephalic and atraumatic.  Eyes: Pupils are equal, round, and reactive to light.  Neck: Normal range of motion. Neck supple.  Cardiovascular: Normal rate, regular rhythm and normal heart sounds.   Pulmonary/Chest: Effort normal. She has decreased breath sounds.  Abdominal: Soft. Bowel sounds are normal.  Neurological: She is alert and oriented to person, place, and time.  Skin: Skin is warm and dry.        Assessment & Plan:        Encouraged healthy diet modifications to decrease risk for DM and HLD.  Discussed the need for medications if A1C >6.5.   HLD:  Not Controlled.  Continue current regimen.  Encourage low cholesterol, low fat diet and exercise.   HTN:  Controlled.   Goal BP <140/80.  Continue current medication regimen.  Encourage low salt diet and exercise.   Hypothyroidism:  Stable.  Continue current therapy.   COPD:  Refilled Albuterol.  Encouraged smoking cessation.  Discussed only able to get Advair at [facility].         Jacelyn Pieah Doles-Johnson, NP   [facility] [place]

## 2017-03-19 ENCOUNTER — Ambulatory Visit: Payer: Self-pay

## 2017-05-08 ENCOUNTER — Telehealth: Payer: Self-pay | Admitting: Pharmacy Technician

## 2017-05-08 NOTE — Telephone Encounter (Signed)
Patient failed to provide 2018 proof of income.  No additional medication assistance will be provided by [facility] without the required proof of income documentation.  Patient notified by letter.  Bralyn Espino J. Maizee Reinhold Care Manager [facility] 

## 2017-05-15 ENCOUNTER — Other Ambulatory Visit: Payer: Self-pay

## 2017-05-15 NOTE — Telephone Encounter (Signed)
Couldn't reach patient for refill information.  vm

## 2017-06-19 ENCOUNTER — Other Ambulatory Visit: Payer: Self-pay

## 2017-06-19 DIAGNOSIS — E039 Hypothyroidism, unspecified: Secondary | ICD-10-CM

## 2017-06-19 MED ORDER — LEVOTHYROXINE SODIUM 150 MCG PO TABS: 150.0000 ug | ORAL_TABLET | Freq: Every day | ORAL | 3 refills | Status: DC

## 2017-06-19 NOTE — Telephone Encounter (Signed)
Refill sent to [facility] 

## 2017-07-08 ENCOUNTER — Other Ambulatory Visit: Payer: Self-pay

## 2017-07-08 DIAGNOSIS — E039 Hypothyroidism, unspecified: Secondary | ICD-10-CM

## 2017-07-08 MED ORDER — LEVOTHYROXINE SODIUM 150 MCG PO TABS: 150.0000 ug | ORAL_TABLET | Freq: Every day | ORAL | 3 refills | Status: DC

## 2017-07-09 ENCOUNTER — Telehealth: Payer: Self-pay | Admitting: Adult Health Nurse Practitioner

## 2017-07-09 NOTE — Telephone Encounter (Signed)
Called to confirm eye appointment. Called back and confirmed.

## 2017-07-23 ENCOUNTER — Telehealth: Payer: Self-pay | Admitting: Adult Health Nurse Practitioner

## 2017-07-23 NOTE — Telephone Encounter (Signed)
Wants to know if she can be a few minutes late to her eye appt

## 2017-08-01 ENCOUNTER — Ambulatory Visit: Payer: Self-pay | Admitting: Ophthalmology

## 2017-08-19 ENCOUNTER — Other Ambulatory Visit: Payer: Self-pay | Admitting: Adult Health Nurse Practitioner

## 2017-08-22 ENCOUNTER — Ambulatory Visit: Payer: Self-pay | Admitting: Ophthalmology

## 2017-09-03 ENCOUNTER — Other Ambulatory Visit: Payer: Self-pay

## 2017-09-04 ENCOUNTER — Telehealth: Payer: Self-pay

## 2017-09-04 NOTE — Telephone Encounter (Signed)
Returning patients call to reschedule her lab appointment for 09/04/17. LVM

## 2017-09-10 ENCOUNTER — Ambulatory Visit: Payer: Self-pay

## 2017-09-12 ENCOUNTER — Other Ambulatory Visit: Payer: Self-pay

## 2017-09-12 DIAGNOSIS — E119 Type 2 diabetes mellitus without complications: Secondary | ICD-10-CM

## 2017-09-12 DIAGNOSIS — E782 Mixed hyperlipidemia: Secondary | ICD-10-CM

## 2017-09-12 DIAGNOSIS — E039 Hypothyroidism, unspecified: Secondary | ICD-10-CM

## 2017-09-13 LAB — COMPREHENSIVE METABOLIC PANEL
ALBUMIN: 4.5 g/dL (ref 3.5–5.5)
ALT: 18 IU/L (ref 0–32)
AST: 18 IU/L (ref 0–40)
Albumin/Globulin Ratio: 1.5 (ref 1.2–2.2)
Alkaline Phosphatase: 80 IU/L (ref 39–117)
BUN / CREAT RATIO: 15 (ref 9–23)
BUN: 13 mg/dL (ref 6–24)
Bilirubin Total: 0.3 mg/dL (ref 0.0–1.2)
CO2: 26 mmol/L (ref 20–29)
CREATININE: 0.87 mg/dL (ref 0.57–1.00)
Calcium: 9.9 mg/dL (ref 8.7–10.2)
Chloride: 103 mmol/L (ref 96–106)
GFR, EST AFRICAN AMERICAN: 85 mL/min/{1.73_m2} (ref >59)
GFR, EST NON AFRICAN AMERICAN: 74 mL/min/{1.73_m2} (ref >59)
GLOBULIN, TOTAL: 3 g/dL (ref 1.5–4.5)
GLUCOSE: 82 mg/dL (ref 65–99)
Potassium: 4.6 mmol/L (ref 3.5–5.2)
SODIUM: 143 mmol/L (ref 134–144)
TOTAL PROTEIN: 7.5 g/dL (ref 6.0–8.5)

## 2017-09-13 LAB — LIPID PANEL
CHOLESTEROL TOTAL: 227 mg/dL — AB (ref 100–199)
Chol/HDL Ratio: 4 ratio (ref 0.0–4.4)
HDL: 57 mg/dL (ref >39)
LDL Calculated: 143 mg/dL — ABNORMAL HIGH (ref 0–99)
Triglycerides: 133 mg/dL (ref 0–149)
VLDL Cholesterol Cal: 27 mg/dL (ref 5–40)

## 2017-09-13 LAB — TSH: TSH: 0.221 u[IU]/mL — AB (ref 0.450–4.500)

## 2017-09-13 LAB — HEMOGLOBIN A1C
ESTIMATED AVERAGE GLUCOSE: 131 mg/dL
HEMOGLOBIN A1C: 6.2 % — AB (ref 4.8–5.6)

## 2017-09-19 ENCOUNTER — Emergency Department
Admission: EM | Admit: 2017-09-19 | Discharge: 2017-09-19 | Disposition: A | Discharge status: Home / Self Care | Payer: Self-pay | Attending: Emergency Medicine | Admitting: Emergency Medicine

## 2017-09-19 ENCOUNTER — Encounter: Payer: Self-pay | Admitting: Emergency Medicine

## 2017-09-19 ENCOUNTER — Other Ambulatory Visit: Payer: Self-pay

## 2017-09-19 ENCOUNTER — Emergency Department: Payer: Self-pay

## 2017-09-19 DIAGNOSIS — W000XXA Fall on same level due to ice and snow, initial encounter: Secondary | ICD-10-CM | POA: Insufficient documentation

## 2017-09-19 DIAGNOSIS — E039 Hypothyroidism, unspecified: Secondary | ICD-10-CM | POA: Insufficient documentation

## 2017-09-19 DIAGNOSIS — Y999 Unspecified external cause status: Secondary | ICD-10-CM | POA: Insufficient documentation

## 2017-09-19 DIAGNOSIS — J449 Chronic obstructive pulmonary disease, unspecified: Secondary | ICD-10-CM | POA: Insufficient documentation

## 2017-09-19 DIAGNOSIS — Y9301 Activity, walking, marching and hiking: Secondary | ICD-10-CM | POA: Insufficient documentation

## 2017-09-19 DIAGNOSIS — S40011A Contusion of right shoulder, initial encounter: Secondary | ICD-10-CM | POA: Insufficient documentation

## 2017-09-19 DIAGNOSIS — W19XXXA Unspecified fall, initial encounter: Secondary | ICD-10-CM

## 2017-09-19 DIAGNOSIS — E119 Type 2 diabetes mellitus without complications: Secondary | ICD-10-CM | POA: Insufficient documentation

## 2017-09-19 DIAGNOSIS — Z79899 Other long term (current) drug therapy: Secondary | ICD-10-CM | POA: Insufficient documentation

## 2017-09-19 DIAGNOSIS — Y92481 Parking lot as the place of occurrence of the external cause: Secondary | ICD-10-CM | POA: Insufficient documentation

## 2017-09-19 DIAGNOSIS — F17210 Nicotine dependence, cigarettes, uncomplicated: Secondary | ICD-10-CM | POA: Insufficient documentation

## 2017-09-19 DIAGNOSIS — J45909 Unspecified asthma, uncomplicated: Secondary | ICD-10-CM | POA: Insufficient documentation

## 2017-09-19 MED ORDER — IBUPROFEN 600 MG PO TABS
600.0000 mg | ORAL_TABLET | Freq: Once | ORAL | Status: AC
  Administered 2017-09-19: 600 mg via ORAL
  Filled 2017-09-19: qty 1

## 2017-09-19 MED ORDER — IBUPROFEN 600 MG PO TABS: 600.0000 mg | ORAL_TABLET | Freq: Three times a day (TID) | ORAL | 0 refills | Status: DC | PRN

## 2017-09-19 NOTE — ED Notes (Signed)
Pt refused sling and stated she did not want to take it with her. Pt stated she was going back to work.

## 2017-09-19 NOTE — Discharge Instructions (Signed)
Ice and elevate as needed for pain or swelling. Wear sling for the next 2 days. Ibuprofen 600 mg 3 times a day with food. Follow-up with your primary care provider if any continued problems.

## 2017-09-19 NOTE — ED Notes (Signed)
Supervisor from company is here with pt. 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 At this time pt and supervisor have ask that we move forward with checking the pt shoulder and it will be self pay and the company will work with the pt to cover expenses. I explained to supervisor and pt that once services are rendered we cannot go back and do worker comp and they are fine with that. Pt wants to be treated and leave to go back to work. Pt is aware that no workers comp will be filed for todays visit.

## 2017-09-19 NOTE — ED Triage Notes (Signed)
Fell in parking lot at work  Having pain to right shoulder/arm

## 2017-09-19 NOTE — ED Provider Notes (Signed)
[facility] Emergency Department Provider Note  ___________________________________________   First MD Initiated Contact with Patient 09/19/17 [phone]     (approximate)  I have reviewed the triage vital signs and the nursing notes.   HISTORY  Chief Complaint Fall   HPI Amy Knapp is a 61 y.o. female   Complaint of right shoulder and arm pain after falling in the parking lot at work this morning. Patient denies any head injury or loss of consciousness. She continues to have pain in her right shoulder. She has not taken any over-the-counter medication. Patient denies any injury to her lower extremities. Company was called and this is not considered a Workmen's Comp.she rates her pain as an 8 out of 10.   Past Medical History:  Diagnosis Date  . Allergy   . Anxiety   . Arthritis   . Asthma   . Broken leg 2007   Right  . Cataract   . COPD (chronic obstructive pulmonary disease) (HCC)   . Depression   . Diabetes mellitus without complication (HCC)   . Emphysema of lung (HCC)   . GERD (gastroesophageal reflux disease)   . Gout   . Hyperlipidemia   . Sleep apnea   . Thyroid disease     Patient Active Problem List   Diagnosis Date Noted  . Flu vaccine need 11/13/2016  . Hyperlipidemia 08/14/2016  . Edema extremities 08/14/2016  . Lower back pain 12/09/2014  . Hypothyroidism 11/11/2014  . COPD (chronic obstructive pulmonary disease) (HCC) 11/11/2014  . Gout 11/11/2014  . Seborrheic keratosis 10/28/2014  . Diabetes (HCC) 09/14/2014  . Sleep apnea 06/29/2014    Past Surgical History:  Procedure Laterality Date  . ABDOMINAL HYSTERECTOMY  2000   Partial  . LEG SURGERY Right 10/08/2005   broken right leg  . TUBAL LIGATION      Prior to Admission medications   Medication Sig Start Date End Date Taking? Authorizing Provider  albuterol (VENTOLIN HFA) 108 (90 Base) MCG/ACT inhaler Inhale 2 puffs into the lungs every 6 (six) hours  as needed for wheezing or shortness of breath. 03/12/17   Doles-Johnson, Teah, NP  allopurinol (ZYLOPRIM) 100 MG tablet TAKE 1 TABLET BY MOUTH EVERY DAY 08/20/17   Doles-Johnson, Teah, NP  atorvastatin (LIPITOR) 20 MG tablet Take 1 tablet (20 mg total) by mouth daily. 01/29/17   Doles-Johnson, Teah, NP  hydrochlorothiazide (MICROZIDE) 12.5 MG capsule Take 1 capsule (12.5 mg total) by mouth daily. 01/29/17   Doles-Johnson, Teah, NP  ibuprofen (ADVIL,MOTRIN) 600 MG tablet Take 1 tablet (600 mg total) by mouth every 8 (eight) hours as needed. 09/19/17   Tommi Rumps, PA-C  levothyroxine (SYNTHROID) 150 MCG tablet Take 1 tablet (150 mcg total) by mouth daily before breakfast. 07/08/17   Doles-Johnson, Teah, NP    Allergies Celery oil; Demerol [meperidine]; Morphine and related; and Penicillins  Family History  Problem Relation Age of Onset  . Cancer Mother   . Heart attack Father   . Heart attack Sister   . Cancer Brother   . Diabetes Daughter   . Diabetes Son   . Hypertension Maternal Aunt     Social History Social History   Tobacco Use  . Smoking status: Current Every Day Smoker    Packs/day: 0.25    Years: 15.00    Pack years: 3.75    Types: Cigarettes  . Smokeless tobacco: Never Used  Substance Use Topics  . Alcohol use: No  . Drug use:  No    Review of Systems Constitutional: No fever/chills Eyes: No visual changes. ENT: no trauma Cardiovascular: Denies chest pain. Respiratory: Denies shortness of breath. Gastrointestinal: No abdominal pain.  No nausea, no vomiting.  Musculoskeletal: positive for right shoulder pain. Skin: Negative for rash. Neurological: Negative for headaches, focal weakness or numbness. ____________________________________________   PHYSICAL EXAM:  VITAL SIGNS: ED Triage Vitals  Enc Vitals Group     BP 09/19/17 0721 (!) 142/70     Pulse Rate 09/19/17 0721 87     Resp 09/19/17 0721 18     Temp 09/19/17 0721 97.8 F (36.6 C)     Temp  Source 09/19/17 0721 Oral     SpO2 09/19/17 0721 95 %     Weight 09/19/17 0722 180 lb (81.6 kg)     Height 09/19/17 0722 5' (1.524 m)     Head Circumference --      Peak Flow --      Pain Score 09/19/17 0721 8     Pain Loc --      Pain Edu? --      Excl. in GC? --     Constitutional: Alert and oriented. Well appearing and in no acute distress. Eyes: Conjunctivae are normal. PERRL. EOMI. Head: Atraumatic. Nose: no trauma Neck: No stridor.  No tenderness to cervical spine on palpation posteriorly. Range of motion is without restriction. Cardiovascular: Normal rate, regular rhythm. Grossly normal heart sounds.  Good peripheral circulation. Respiratory: Normal respiratory effort.  No retractions. Lungs CTAB. Gastrointestinal: Soft and nontender. No distention.  Musculoskeletal: on examination of the right shoulder there is no gross deformity noted. There is no soft tissue swelling present, abrasions or ecchymosis.  Range of motion is restricted secondary to pain. No crepitus is noted. Patient is guarding her extremity against her body. Skin is intact. Pulses present. Motor sensory function intact distal to the injury. Patient is ambulatory in the room without assistance. Neurologic:  Normal speech and language. No gross focal neurologic deficits are appreciated. No gait instability. Skin:  Skin is warm, dry and intact. No ecchymosis, abrasions or erythema noted. Psychiatric: Mood and affect are normal. Speech and behavior are normal.  ____________________________________________   LABS (all labs ordered are listed, but only abnormal results are displayed)  Labs Reviewed - No data to display  RADIOLOGY  Dg Shoulder Right  Result Date: 09/19/2017 CLINICAL DATA:  61 year old female status post fall on ice with pain. Limited range of motion. EXAM: RIGHT SHOULDER - 2+ VIEW COMPARISON:  Chest radiographs 04/18/2013. FINDINGS: No glenohumeral joint dislocation. Proximal right humerus  appears intact. No definite clavicle or scapula fracture. Visible right lung parenchyma is clear. There is a chronic appearing fracture of the right posterolateral fifth rib with callus (arrow). IMPRESSION: 1. No acute fracture or dislocation identified about the right shoulder. 2. Chronic appearing right fifth rib fracture. Electronically Signed   By: Odessa FlemingH  Hall M.D.   On: 09/19/2017 08:23    ____________________________________________   PROCEDURES  Procedure(s) performed: None  Procedures  Critical Care performed: No  ____________________________________________   INITIAL IMPRESSION / ASSESSMENT AND PLAN / ED COURSE Patient was given ibuprofen while in the department. We discussed x-ray findings. She is to ice and elevate her arm as needed for swelling or pain. She was given a sling to wear for the next 2 days. She is to follow-up with her primary care provider if any continued problems. ____________________________________________   FINAL CLINICAL IMPRESSION(S) / ED DIAGNOSES  Final diagnoses:  Contusion of right shoulder, initial encounter  Fall, initial encounter     ED Discharge Orders        Ordered    ibuprofen (ADVIL,MOTRIN) 600 MG tablet  Every 8 hours PRN     09/19/17 0836       Note:  This document was prepared using Dragon voice recognition software and may include unintentional dictation errors.    Tommi RumpsSummers, Jae Skeet L, PA-C 09/19/17 1328    Merrily Brittleifenbark, Neil, MD 09/19/17 2238

## 2017-09-26 ENCOUNTER — Ambulatory Visit: Payer: Self-pay | Admitting: Family Medicine

## 2017-09-26 VITALS — BP 153/85 | HR 83 | Temp 98.3°F | Wt 180.1 lb

## 2017-09-26 DIAGNOSIS — E0590 Thyrotoxicosis, unspecified without thyrotoxic crisis or storm: Secondary | ICD-10-CM

## 2017-09-26 DIAGNOSIS — R52 Pain, unspecified: Secondary | ICD-10-CM

## 2017-09-26 DIAGNOSIS — R609 Edema, unspecified: Secondary | ICD-10-CM

## 2017-09-26 DIAGNOSIS — M109 Gout, unspecified: Secondary | ICD-10-CM

## 2017-09-26 DIAGNOSIS — Z09 Encounter for follow-up examination after completed treatment for conditions other than malignant neoplasm: Secondary | ICD-10-CM

## 2017-09-26 DIAGNOSIS — R7303 Prediabetes: Secondary | ICD-10-CM

## 2017-09-26 DIAGNOSIS — E039 Hypothyroidism, unspecified: Secondary | ICD-10-CM

## 2017-09-26 DIAGNOSIS — I10 Essential (primary) hypertension: Secondary | ICD-10-CM

## 2017-09-26 DIAGNOSIS — E785 Hyperlipidemia, unspecified: Secondary | ICD-10-CM

## 2017-09-26 MED ORDER — ATORVASTATIN CALCIUM 20 MG PO TABS: 20.0000 mg | ORAL_TABLET | Freq: Every day | ORAL | 0 refills | Status: DC

## 2017-09-26 MED ORDER — HYDROCHLOROTHIAZIDE 12.5 MG PO CAPS: 12.5000 mg | ORAL_CAPSULE | Freq: Every day | ORAL | 0 refills | Status: DC

## 2017-09-26 MED ORDER — ALLOPURINOL 100 MG PO TABS: 100.0000 mg | ORAL_TABLET | Freq: Every day | ORAL | 0 refills | Status: DC

## 2017-09-26 MED ORDER — CELECOXIB 200 MG PO CAPS: 200.0000 mg | ORAL_CAPSULE | Freq: Two times a day (BID) | ORAL | 1 refills | Status: DC

## 2017-09-26 MED ORDER — LEVOTHYROXINE SODIUM 137 MCG PO TABS: 137.0000 ug | ORAL_TABLET | Freq: Every day | ORAL | 2 refills | Status: DC

## 2017-09-26 NOTE — Progress Notes (Signed)
Patient: Amy Knapp Female    DOB: 1956-05-04   61 y.o.   MRN: [id number] Visit Date: 09/26/2017  Today's Provider: Kallie LocksNatalie M Khelani Kops, FNP   Chief Complaint  Patient presents with  . Shoulder Pain    right shoulder pain from fall   Subjective:    HPI Patient is here today for follow up and medication refills. She states that she is feeling well with no complaints. States that she recently had a fall and is still very sore on her right side, especially her right shoulder.  Patient states that Mobic is not helping her pain and she requests another medications.   Denies fevers, night sweats, weight loss, and recent infections. Denies shortness of breath, chest pain, and heart palpitations. Denies abdominal pain, nausea, vomiting, diarrhea, and any bleeding episodes.      Allergies  Allergen Reactions  . Celery Oil   . Demerol [Meperidine]   . Morphine And Related Other (See Comments)    Muscle Spasms  . Penicillins    This SmartLink is deprecated. Use AVSMEDLIST instead to display the medication list for a patient.  Review of Systems  Constitutional: Negative.   HENT: Positive for congestion.   Eyes: Negative.   Endocrine: Negative.   Musculoskeletal:       Shoulder pain  Allergic/Immunologic: Negative.     Social History   Tobacco Use  . Smoking status: Current Every Day Smoker    Packs/day: 0.25    Years: 15.00    Pack years: 3.75    Types: Cigarettes  . Smokeless tobacco: Never Used  Substance Use Topics  . Alcohol use: No   Objective:   BP (!) 153/85 (BP Location: Left Arm, Patient Position: Sitting)   Pulse 83   Temp 98.3 F (36.8 C) (Oral)   Wt 180 lb 1.6 oz (81.7 kg)   BMI 35.17 kg/m   Physical Exam  Constitutional: She is oriented to person, place, and time. She appears well-developed and well-nourished.  HENT:  Head: Normocephalic and atraumatic.  Right Ear: External ear normal.  Left Ear: External ear normal.  Nose: Nose normal.   Mouth/Throat: Oropharynx is clear and moist.  Eyes: Conjunctivae and EOM are normal. Pupils are equal, round, and reactive to light.  Neck: Normal range of motion. Neck supple.  Cardiovascular: Normal rate, regular rhythm, normal heart sounds and intact distal pulses.  Pulmonary/Chest: Effort normal and breath sounds normal.  Abdominal: Soft. Bowel sounds are normal. She exhibits distension. There is tenderness.  Right upper abdominal tenderness----bruised and sore from fall last week.   Musculoskeletal: Normal range of motion. She exhibits edema.  Lower extremity 1+ edema in ankles.  Neurological: She is alert and oriented to person, place, and time. She has normal reflexes.  Skin: Skin is warm and dry.  Psychiatric: She has a normal mood and affect. Her behavior is normal. Judgment and thought content normal.  Nursing note and vitals reviewed.      Assessment & Plan:   1. Hypothyroidism, unspecified type TSH= 0.221 today. Patient is currently on Synthroid 150 mcg. Will decrease dose to Synthroid 137 mcg. Refill called to pharmacy. Monitor.   2. Essential hypertension BP= 153/85 today. Continue to HCTZ. Monitor.  3. Hyperlipidemia, unspecified hyperlipidemia type Cholesterol= 227, LDL=143.  Continue Lipitor 20 mg as directed. Monitor.   4. Gout of multiple sites, unspecified cause, unspecified chronicity Stable. Continue Allopurinol as directed.   5. Pain Right shoulder pain/soreness. Rx for Celebrex called to pharmacy. Monitor.  6. Prediabetes On 09/12/2017 Hgb A1c= 6.2. Patient to have follow up labs in 3 months.  7.  Edema, unspecified typeFollow up Stable. Continue to elevate legs. Continue to take diuretic HCTZ. Monitor.  8. Follow up Follow up for Labs/OV in 1 month to check TSH level.  Patient to follow up in 3 months for Labs/OV (CBC, CMET, Lipid, TSH, Hgb A1c).     Kallie LocksNatalie M Jousha Schwandt, FNP   [facility]

## 2017-09-27 ENCOUNTER — Telehealth: Payer: Self-pay | Admitting: Pharmacy Technician

## 2017-09-27 NOTE — Telephone Encounter (Signed)
Patient failed to provide current poi.  No additional medication assistance will be provided by [facility] without the required proof of income documentation.  Patient notified by letter.  Elexia Friedt J. Daimien Patmon Care Manager [facility] 

## 2017-10-10 ENCOUNTER — Ambulatory Visit: Payer: Self-pay | Admitting: Ophthalmology

## 2017-10-24 ENCOUNTER — Other Ambulatory Visit: Payer: Self-pay

## 2017-10-29 ENCOUNTER — Other Ambulatory Visit: Payer: Self-pay

## 2017-10-29 DIAGNOSIS — E039 Hypothyroidism, unspecified: Secondary | ICD-10-CM

## 2017-10-29 NOTE — Addendum Note (Signed)
Addended by: Madelin RearLEATH, Charae Depaolis A on: 10/29/2017 07:05 PM   Modules accepted: Orders

## 2017-10-30 LAB — TSH: TSH: 1.55 u[IU]/mL (ref 0.450–4.500)

## 2017-10-31 ENCOUNTER — Ambulatory Visit: Payer: Self-pay | Admitting: Family Medicine

## 2017-10-31 VITALS — BP 110/76 | HR 99 | Temp 98.5°F | Wt 183.1 lb

## 2017-10-31 DIAGNOSIS — E039 Hypothyroidism, unspecified: Secondary | ICD-10-CM

## 2017-10-31 DIAGNOSIS — Z1239 Encounter for other screening for malignant neoplasm of breast: Secondary | ICD-10-CM

## 2017-10-31 DIAGNOSIS — I10 Essential (primary) hypertension: Secondary | ICD-10-CM

## 2017-10-31 DIAGNOSIS — Z09 Encounter for follow-up examination after completed treatment for conditions other than malignant neoplasm: Secondary | ICD-10-CM

## 2017-10-31 DIAGNOSIS — E785 Hyperlipidemia, unspecified: Secondary | ICD-10-CM

## 2017-10-31 DIAGNOSIS — R609 Edema, unspecified: Secondary | ICD-10-CM

## 2017-10-31 DIAGNOSIS — M1A9XX0 Chronic gout, unspecified, without tophus (tophi): Secondary | ICD-10-CM

## 2017-10-31 DIAGNOSIS — M2550 Pain in unspecified joint: Secondary | ICD-10-CM

## 2017-10-31 MED ORDER — ALLOPURINOL 100 MG PO TABS: 100.0000 mg | ORAL_TABLET | Freq: Every day | ORAL | 2 refills | Status: DC

## 2017-10-31 MED ORDER — IBUPROFEN 600 MG PO TABS: 600.0000 mg | ORAL_TABLET | Freq: Three times a day (TID) | ORAL | 2 refills | Status: DC | PRN

## 2017-10-31 MED ORDER — LEVOTHYROXINE SODIUM 137 MCG PO TABS: ORAL_TABLET | ORAL | 2 refills | Status: DC

## 2017-10-31 MED ORDER — LEVOTHYROXINE SODIUM 125 MCG PO TABS: ORAL_TABLET | ORAL | 2 refills | Status: DC

## 2017-10-31 NOTE — Progress Notes (Signed)
Patient: Amy Knapp Female    DOB: [birth year]   62 y.o.   MRN: [id number] Visit Date: 10/31/2017  Today's Provider: Kallie LocksNatalie M Freada Twersky, FNP   Chief Complaint  Patient presents with  . Follow-up   Subjective:   HPI Patient is here today for follow up and lab review. States that she is feeling well. She states that she does have peripheral edema.     Allergies  Allergen Reactions  . Celery Oil   . Demerol [Meperidine]   . Morphine And Related Other (See Comments)    Muscle Spasms  . Penicillins    Previous Medications   ALBUTEROL (VENTOLIN HFA) 108 (90 BASE) MCG/ACT INHALER    Inhale 2 puffs into the lungs every 6 (six) hours as needed for wheezing or shortness of breath.   ALLOPURINOL (ZYLOPRIM) 100 MG TABLET    Take 1 tablet (100 mg total) by mouth daily.   ATORVASTATIN (LIPITOR) 20 MG TABLET    Take 1 tablet (20 mg total) by mouth daily.   CELECOXIB (CELEBREX) 200 MG CAPSULE    Take 1 capsule (200 mg total) by mouth 2 (two) times daily.   HYDROCHLOROTHIAZIDE (MICROZIDE) 12.5 MG CAPSULE    Take 1 capsule (12.5 mg total) by mouth daily.   IBUPROFEN (ADVIL,MOTRIN) 600 MG TABLET    Take 1 tablet (600 mg total) by mouth every 8 (eight) hours as needed.   LEVOTHYROXINE (SYNTHROID) 137 MCG TABLET    Take 1 tablet (137 mcg total) by mouth daily before breakfast.    Review of Systems  Gastrointestinal: Positive for abdominal distention.  Musculoskeletal: Positive for arthralgias.  All other systems reviewed and are negative.   Social History   Tobacco Use  . Smoking status: Current Every Day Smoker    Packs/day: 0.25    Years: 15.00    Pack years: 3.75    Types: Cigarettes  . Smokeless tobacco: Never Used  Substance Use Topics  . Alcohol use: No   Objective:   BP 110/76 (BP Location: Left Arm, Patient Position: Sitting, Cuff Size: Normal)   Pulse 99   Temp 98.5 F (36.9 C)   Wt 183 lb 1.6 oz (83.1 kg)   BMI 35.76 kg/m   Physical Exam  Constitutional:  She is oriented to person, place, and time. She appears well-developed and well-nourished.  HENT:  Head: Normocephalic and atraumatic.  Right Ear: External ear normal.  Left Ear: External ear normal.  Mouth/Throat: Oropharynx is clear and moist.  Eyes: Conjunctivae and EOM are normal. Pupils are equal, round, and reactive to light.  Neck: Normal range of motion. Neck supple.  Cardiovascular: Normal rate, regular rhythm, normal heart sounds and intact distal pulses.  Pulmonary/Chest: Effort normal and breath sounds normal.  Abdominal: Soft. Bowel sounds are normal. She exhibits distension.  Musculoskeletal: Normal range of motion. She exhibits edema.  Dependent edema in ankles.  Neurological: She is alert and oriented to person, place, and time. She has normal reflexes.  Skin: Skin is warm and dry.  Psychiatric: She has a normal mood and affect. Her behavior is normal. Judgment and thought content normal.       Assessment & Plan:   1. Hypothyroidism, unspecified type TSH normal. Continue Synthroid 137 mcg as directed. Monitor.  - Comprehensive metabolic panel; Future - TSH - Lipid Profile - CBC; Future  2. Essential hypertension BP is 110/76. Continue HCTZ 12.5 as directed. Monitor.   3. Chronic gout without tophus, unspecified cause, unspecified site Stable.  Continue Allopurinol as directed  4. Arthralgia, unspecified joint Probable arthritic pain. Exercise daily to improve relief of joint pain. Continue Motrin as directed.   5. Hyperlipidemia, unspecified hyperlipidemia type Lipid panel improving. Continue Lipid as directed. Monitor.   6. Screening for breast cancer Last Mammogram 4-5 years ago. Patient referred to [facility] to call for breast cancer screening.  7. Dependent edema Patient counseled to elevate legs as much as possible. Monitor.   8. Follow up Follow up in 2 months for OV/ Review Labs    Kallie Locks, FNP   [facility]

## 2017-11-19 ENCOUNTER — Other Ambulatory Visit: Payer: Self-pay

## 2017-11-19 DIAGNOSIS — E039 Hypothyroidism, unspecified: Secondary | ICD-10-CM

## 2017-11-19 MED ORDER — LEVOTHYROXINE SODIUM 137 MCG PO TABS: ORAL_TABLET | ORAL | 2 refills | Status: DC

## 2017-11-20 ENCOUNTER — Telehealth: Payer: Self-pay

## 2017-11-20 NOTE — Telephone Encounter (Signed)
[facility]

## 2017-12-03 NOTE — Progress Notes (Signed)
Will reassess Lipid and Glucose levels at next OV.

## 2017-12-23 ENCOUNTER — Ambulatory Visit: Payer: Self-pay | Admitting: Pharmacy Technician

## 2017-12-23 ENCOUNTER — Encounter (INDEPENDENT_AMBULATORY_CARE_PROVIDER_SITE_OTHER): Payer: Self-pay

## 2017-12-23 DIAGNOSIS — Z79899 Other long term (current) drug therapy: Secondary | ICD-10-CM

## 2017-12-23 NOTE — Progress Notes (Signed)
Completed [facility] application and contract.  Patient agreed to all terms of the [facility] contract.    Patient approved to receive medication assistance at [facility] through 2019, as long as eligibility criteria continues to be met.    Provided patient with community resource material based on her particular needs.    Ventolin Prescription Application completed with patient.  Forwarded to [facility] for signature.  Upon receipt of signed application from provider, Ventolin Prescription Application will be submitted to [place].  Referred patient for MTM.  Rita-Pharmacy Tech to [employer] and have prescriptions transferred to [facility].  [facility]

## 2017-12-26 ENCOUNTER — Other Ambulatory Visit: Payer: Self-pay

## 2017-12-26 DIAGNOSIS — E039 Hypothyroidism, unspecified: Secondary | ICD-10-CM

## 2017-12-27 LAB — COMPREHENSIVE METABOLIC PANEL
ALT: 15 IU/L (ref 0–32)
AST: 20 IU/L (ref 0–40)
Albumin/Globulin Ratio: 1.5 (ref 1.2–2.2)
Albumin: 4.3 g/dL (ref 3.5–5.5)
Alkaline Phosphatase: 93 IU/L (ref 39–117)
BUN/Creatinine Ratio: 14 (ref 9–23)
BUN: 12 mg/dL (ref 6–24)
Bilirubin Total: 0.5 mg/dL (ref 0.0–1.2)
CO2: 26 mmol/L (ref 20–29)
Calcium: 9.9 mg/dL (ref 8.7–10.2)
Chloride: 97 mmol/L (ref 96–106)
Creatinine, Ser: 0.88 mg/dL (ref 0.57–1.00)
GFR calc Af Amer: 83 mL/min/{1.73_m2} (ref >59)
GFR calc non Af Amer: 72 mL/min/{1.73_m2} (ref >59)
Globulin, Total: 2.9 g/dL (ref 1.5–4.5)
Glucose: 184 mg/dL — ABNORMAL HIGH (ref 65–99)
Potassium: 3.7 mmol/L (ref 3.5–5.2)
Sodium: 140 mmol/L (ref 134–144)
Total Protein: 7.2 g/dL (ref 6.0–8.5)

## 2017-12-27 LAB — CBC
Hematocrit: 40.3 % (ref 34.0–46.6)
Hemoglobin: 13.8 g/dL (ref 11.1–15.9)
MCH: 30 pg (ref 26.6–33.0)
MCHC: 34.2 g/dL (ref 31.5–35.7)
MCV: 88 fL (ref 79–97)
Platelets: 283 10*3/uL (ref 150–379)
RBC: 4.6 x10E6/uL (ref 3.77–5.28)
RDW: 13.2 % (ref 12.3–15.4)
WBC: 10.4 10*3/uL (ref 3.4–10.8)

## 2018-01-02 ENCOUNTER — Ambulatory Visit: Payer: Self-pay | Admitting: Family Medicine

## 2018-01-02 VITALS — BP 111/69 | HR 85 | Temp 98.4°F | Wt 184.7 lb

## 2018-01-02 DIAGNOSIS — J441 Chronic obstructive pulmonary disease with (acute) exacerbation: Secondary | ICD-10-CM

## 2018-01-02 DIAGNOSIS — E785 Hyperlipidemia, unspecified: Secondary | ICD-10-CM

## 2018-01-02 DIAGNOSIS — R600 Localized edema: Secondary | ICD-10-CM

## 2018-01-02 MED ORDER — DOXYCYCLINE HYCLATE 50 MG PO CAPS: 50.0000 mg | ORAL_CAPSULE | Freq: Two times a day (BID) | ORAL | 0 refills | Status: DC

## 2018-01-02 NOTE — Progress Notes (Signed)
   Subjective:    Patient ID: Amy Knapp, female    DOB: 03-31-1956, 62 y.o.   MRN: [id number]  HPI  Routine f/u scheduled.  "I have the crud."  Review of Systems URI started with AR then sinus congestion now has productive cough of green/grey sputum.   No fevers. Some wheezing present. Still smoking. Objective:   Physical Exam WNWDWF NAD HEENT -Por dentition Neck-supple without adenopathy COR--RRR Lungs -clear LE trace edema  Non fasting labs--BS 184.      Assessment & Plan:  Copd exacerbation Doxycycline and MDI,push fluids Smoking Stop smoking Hypothyroid HLD Prediabetes A1C next lab draw.TSH also. RTC 3-4 months.  I have done the exam and reviewed the chart and it is accurate to the best of my knowledge. DentistDragon  technology has been used and  any errors in dictation or transcription are unintentional. Julieanne Mansonichard Tamerra Merkley M.D. [facility]

## 2018-01-10 ENCOUNTER — Telehealth: Payer: Self-pay | Admitting: Pharmacist

## 2018-01-10 NOTE — Telephone Encounter (Signed)
01/10/2018 2:35:59 PM - Ventolin HFA  01/10/18 Faxed [employer] application for enrollment for Ventolin HFA Inhale 2 puffs every 6 hours as needed for wheezing or shortness of breath.Forde RadonAJ

## 2018-01-28 ENCOUNTER — Ambulatory Visit: Payer: Self-pay | Admitting: Adult Health Nurse Practitioner

## 2018-01-28 VITALS — BP 136/70 | HR 113 | Temp 99.7°F | Wt 182.4 lb

## 2018-01-28 DIAGNOSIS — E785 Hyperlipidemia, unspecified: Secondary | ICD-10-CM

## 2018-01-28 DIAGNOSIS — E119 Type 2 diabetes mellitus without complications: Secondary | ICD-10-CM

## 2018-01-28 DIAGNOSIS — E039 Hypothyroidism, unspecified: Secondary | ICD-10-CM

## 2018-01-28 DIAGNOSIS — J441 Chronic obstructive pulmonary disease with (acute) exacerbation: Secondary | ICD-10-CM

## 2018-01-28 MED ORDER — GUAIFENESIN 100 MG/5ML PO SOLN: 5.0000 mL | ORAL | 0 refills | Status: DC | PRN

## 2018-01-28 MED ORDER — AZITHROMYCIN 250 MG PO TABS
ORAL_TABLET | ORAL | 0 refills | Status: DC
Start: 2018-01-28 — End: 2018-03-04

## 2018-01-28 MED ORDER — PREDNISONE 20 MG PO TABS: 20.0000 mg | ORAL_TABLET | Freq: Every day | ORAL | 0 refills | Status: DC

## 2018-01-28 MED ORDER — FLUTICASONE-SALMETEROL 250-50 MCG/DOSE IN AEPB: 1.0000 | INHALATION_SPRAY | Freq: Two times a day (BID) | RESPIRATORY_TRACT | 4 refills | Status: DC

## 2018-01-28 NOTE — Progress Notes (Signed)
  Patient: Amy Knapp Female    DOB: 1956-03-18   62 y.o.   MRN: [id number] Visit Date: 01/28/2018  Today's Provider: Jacelyn Pieah Doles-Johnson, NP   Chief Complaint  Patient presents with  . Cough    x2 days  . Chest Pain   Subjective:    HPI   Pt states that she has a productive cough with yellow/green sputum. Reports low-grade fever. Runny Nose. Sneezing.  Eating and drinking well.  Denies vomiting and diarrhea, endorses some nausea.  CP is only with coughing.  States she is still smoking 3 cigs per day.   Taking medications as directed.        Allergies  Allergen Reactions  . Celery Oil   . Demerol [Meperidine]   . Morphine And Related Other (See Comments)    Muscle Spasms  . Penicillins    Previous Medications   ALBUTEROL (VENTOLIN HFA) 108 (90 BASE) MCG/ACT INHALER    Inhale 2 puffs into the lungs every 6 (six) hours as needed for wheezing or shortness of breath.   ALLOPURINOL (ZYLOPRIM) 100 MG TABLET    Take 1 tablet (100 mg total) by mouth daily.   ATORVASTATIN (LIPITOR) 20 MG TABLET    Take 1 tablet (20 mg total) by mouth daily.   CELECOXIB (CELEBREX) 200 MG CAPSULE    Take 1 capsule (200 mg total) by mouth 2 (two) times daily.   DOXYCYCLINE (VIBRAMYCIN) 50 MG CAPSULE    Take 1 capsule (50 mg total) by mouth 2 (two) times daily.   HYDROCHLOROTHIAZIDE (MICROZIDE) 12.5 MG CAPSULE    Take 1 capsule (12.5 mg total) by mouth daily.   IBUPROFEN (ADVIL,MOTRIN) 600 MG TABLET    Take 1 tablet (600 mg total) by mouth every 8 (eight) hours as needed.   LEVOTHYROXINE (SYNTHROID, LEVOTHROID) 137 MCG TABLET    Take 1 tablet daily    Review of Systems  All other systems reviewed and are negative.   Social History   Tobacco Use  . Smoking status: Current Every Day Smoker    Packs/day: 0.25    Years: 15.00    Pack years: 3.75    Types: Cigarettes  . Smokeless tobacco: Never Used  Substance Use Topics  . Alcohol use: No   Objective:   BP 136/70   Pulse (!)  113   Temp 99.7 F (37.6 C)   Wt 182 lb 6.4 oz (82.7 kg)   BMI 35.62 kg/m   Physical Exam  Constitutional: She appears well-developed and well-nourished.  HENT:  Head: Normocephalic and atraumatic.  Cardiovascular: Regular rhythm and normal heart sounds.  Pulmonary/Chest: Effort normal. She has wheezes.  Abdominal: Soft. Bowel sounds are normal.  Lymphadenopathy:    She has cervical adenopathy.        Assessment & Plan:        COPD exacerbation:  Z-pack Prednisone 20 x 5 days.  Cough syrup.  Supportive care.  Discussed smoking cessation.  Rx for Advair for maintenance therapy.  Use rescue inhaler PRN.   Hypothyroidism: TSH today.   HLD:  Lipid panel today.  Continue Lipitor.  Low cholesterol diet.   DM:  No medications to manage.  Repeat A1c today.   FU in 1 month for COPD and lab review.   Note given to be out of work tomorrow.    Jacelyn Pieah Doles-Johnson, NP   [facility]

## 2018-01-29 LAB — CBC WITH DIFFERENTIAL/PLATELET
BASOS ABS: 0 10*3/uL (ref 0.0–0.2)
Basos: 0 %
EOS (ABSOLUTE): 0.1 10*3/uL (ref 0.0–0.4)
Eos: 1 %
Hematocrit: 44.3 % (ref 34.0–46.6)
Hemoglobin: 14.6 g/dL (ref 11.1–15.9)
IMMATURE GRANULOCYTES: 0 %
Immature Grans (Abs): 0 10*3/uL (ref 0.0–0.1)
LYMPHS ABS: 1.3 10*3/uL (ref 0.7–3.1)
LYMPHS: 18 %
MCH: 29.8 pg (ref 26.6–33.0)
MCHC: 33 g/dL (ref 31.5–35.7)
MCV: 90 fL (ref 79–97)
Monocytes Absolute: 0.8 10*3/uL (ref 0.1–0.9)
Monocytes: 11 %
NEUTROS PCT: 70 %
Neutrophils Absolute: 5 10*3/uL (ref 1.4–7.0)
Platelets: 257 10*3/uL (ref 150–379)
RBC: 4.9 x10E6/uL (ref 3.77–5.28)
RDW: 13.1 % (ref 12.3–15.4)
WBC: 7.1 10*3/uL (ref 3.4–10.8)

## 2018-01-29 LAB — BASIC METABOLIC PANEL
BUN/Creatinine Ratio: 11 (ref 9–23)
BUN: 10 mg/dL (ref 6–24)
CALCIUM: 9.8 mg/dL (ref 8.7–10.2)
CO2: 24 mmol/L (ref 20–29)
CREATININE: 0.89 mg/dL (ref 0.57–1.00)
Chloride: 97 mmol/L (ref 96–106)
GFR calc Af Amer: 82 mL/min/{1.73_m2} (ref >59)
GFR calc non Af Amer: 71 mL/min/{1.73_m2} (ref >59)
GLUCOSE: 87 mg/dL (ref 65–99)
POTASSIUM: 4.4 mmol/L (ref 3.5–5.2)
SODIUM: 138 mmol/L (ref 134–144)

## 2018-01-29 LAB — LIPID PANEL
CHOLESTEROL TOTAL: 166 mg/dL (ref 100–199)
Chol/HDL Ratio: 2.7 ratio (ref 0.0–4.4)
HDL: 61 mg/dL (ref >39)
LDL CALC: 72 mg/dL (ref 0–99)
TRIGLYCERIDES: 167 mg/dL — AB (ref 0–149)
VLDL Cholesterol Cal: 33 mg/dL (ref 5–40)

## 2018-01-29 LAB — HEMOGLOBIN A1C
ESTIMATED AVERAGE GLUCOSE: 140 mg/dL
Hgb A1c MFr Bld: 6.5 % — ABNORMAL HIGH (ref 4.8–5.6)

## 2018-01-29 LAB — TSH: TSH: 0.043 u[IU]/mL — AB (ref 0.450–4.500)

## 2018-01-30 ENCOUNTER — Other Ambulatory Visit: Payer: Self-pay | Admitting: Adult Health Nurse Practitioner

## 2018-01-30 MED ORDER — LEVOTHYROXINE SODIUM 125 MCG PO TABS: 125.0000 ug | ORAL_TABLET | Freq: Every day | ORAL | 1 refills | Status: DC

## 2018-01-31 ENCOUNTER — Telehealth: Payer: Self-pay

## 2018-01-31 NOTE — Telephone Encounter (Signed)
Lm for pt to call back re: lab results 

## 2018-01-31 NOTE — Telephone Encounter (Signed)
-----   Message from Jacelyn Pieah Doles-Johnson, NP sent at 01/30/2018  5:26 PM EDT ----- TSH is low, patient needs to decrease levothyroxine to 125mg  daily. Will follow up at next visit- also A1c is now within diabetic range.

## 2018-02-04 ENCOUNTER — Telehealth: Payer: Self-pay

## 2018-02-04 NOTE — Telephone Encounter (Signed)
-----   Message from Jacelyn Pi, NP sent at 01/30/2018  5:26 PM EDT ----- TSH is low, patient needs to decrease levothyroxine to  daily. Will follow up at next visit- also A1c is now within diabetic range.

## 2018-02-04 NOTE — Telephone Encounter (Signed)
Pt called back re: labs results. Informed pt to decrease levothyroxine. Med was called into [facility]. Pt understood

## 2018-03-04 ENCOUNTER — Ambulatory Visit: Payer: Self-pay | Admitting: Family Medicine

## 2018-03-04 VITALS — BP 110/68 | HR 99 | Temp 97.9°F | Wt 184.5 lb

## 2018-03-04 DIAGNOSIS — E039 Hypothyroidism, unspecified: Secondary | ICD-10-CM

## 2018-03-04 DIAGNOSIS — E119 Type 2 diabetes mellitus without complications: Secondary | ICD-10-CM

## 2018-03-04 LAB — GLUCOSE, POCT (MANUAL RESULT ENTRY): POC Glucose: 155 mg/dl — AB (ref 70–99)

## 2018-03-04 MED ORDER — LEVOTHYROXINE SODIUM 125 MCG PO TABS: 125.0000 ug | ORAL_TABLET | Freq: Every day | ORAL | 0 refills | Status: DC

## 2018-03-04 MED ORDER — IBUPROFEN 600 MG PO TABS: 600.0000 mg | ORAL_TABLET | Freq: Three times a day (TID) | ORAL | 3 refills | Status: DC | PRN

## 2018-03-04 MED ORDER — METFORMIN HCL ER 500 MG PO TB24: 500.0000 mg | ORAL_TABLET | Freq: Every day | ORAL | 1 refills | Status: DC

## 2018-03-04 NOTE — Progress Notes (Signed)
Primary Care Progress Note  Patient: Amy Knapp Female    DOB: 04/10/1956   62 y.o.   MRN: [id number] Visit Date: 03/04/2018  Today's Provider: Mila Merry, MD  Chief Complaint  Patient presents with  . Follow-up   Subjective:    HPI Follow up hypothyroid and new onset diabetes.  Lab Results  Component Value Date   TSH 0.043 (L) 01/28/2018   Was advised to reduce levothyroxine to which was sent to pharmacy in April, however patient states she is certain she is taking 137. . Feel fine on current dose.   Lab Results  Component Value Date   HGBA1C 6.5 (H) 01/28/2018   She does report strong family history of diabetes. Is aware that she needs to avoid sugars and starchy foods, but has not been doing anything with her diet .  Take, 137.5     Allergies  Allergen Reactions  . Celery Oil   . Demerol [Meperidine]   . Morphine And Related Other (See Comments)    Muscle Spasms  . Penicillins     Current Outpatient Medications:  .  albuterol (VENTOLIN HFA) 108 (90 Base) MCG/ACT inhaler, Inhale 2 puffs into the lungs every 6 (six) hours as needed for wheezing or shortness of breath., Disp: 3 each, Rfl: 1 .  allopurinol (ZYLOPRIM) 100 MG tablet, Take 1 tablet (100 mg total) by mouth daily., Disp: 90 tablet, Rfl: 2 .  atorvastatin (LIPITOR) 20 MG tablet, Take 1 tablet (20 mg total) by mouth daily., Disp: 90 tablet, Rfl: 0 .  azithromycin (ZITHROMAX) 250 MG tablet, Take 2 tabs day one and then 1 tab every day until gone., Disp: 6 each, Rfl: 0 .  celecoxib (CELEBREX) 200 MG capsule, Take 1 capsule (200 mg total) by mouth 2 (two) times daily., Disp: 60 capsule, Rfl: 1 .  doxycycline (VIBRAMYCIN) 50 MG capsule, Take 1 capsule (50 mg total) by mouth 2 (two) times daily., Disp: 20 capsule, Rfl: 0 .  Fluticasone-Salmeterol (ADVAIR) 250-50 MCG/DOSE AEPB, Inhale 1 puff into the lungs 2 (two) times daily., Disp: 60 each, Rfl: 4 .  guaiFENesin (ROBITUSSIN) 100  MG/5ML SOLN, Take 5 mLs (100 mg total) by mouth every 4 (four) hours as needed for cough or to loosen phlegm., Disp: 1200 mL, Rfl: 0 .  hydrochlorothiazide (MICROZIDE) 12.5 MG capsule, Take 1 capsule (12.5 mg total) by mouth daily., Disp: 90 capsule, Rfl: 0 .  ibuprofen (ADVIL,MOTRIN) 600 MG tablet, Take 1 tablet (600 mg total) by mouth every 8 (eight) hours as needed., Disp: 21 tablet, Rfl: 2 .  levothyroxine (SYNTHROID, LEVOTHROID) 125 MCG tablet, Take 1 tablet (125 mcg total) by mouth daily before breakfast., Disp: 30 tablet, Rfl: 1 .  predniSONE (DELTASONE) 20 MG tablet, Take 1 tablet (20 mg total) by mouth daily with breakfast., Disp: 5 tablet, Rfl: 0  Review of Systems  Constitutional: Negative for chills, diaphoresis and fever.  HENT: Negative for congestion, ear discharge, ear pain, hearing loss, nosebleeds, sore throat and tinnitus.   Eyes: Negative for photophobia, pain, discharge and redness.  Respiratory: Negative for cough, shortness of breath, wheezing and stridor.   Cardiovascular: Negative for chest pain, palpitations and leg swelling.  Gastrointestinal: Negative for abdominal pain, blood in stool, constipation, diarrhea, nausea and vomiting.  Endocrine: Negative for polydipsia.  Genitourinary: Negative for dysuria, flank pain, frequency, hematuria and urgency.  Musculoskeletal: Negative for back pain, myalgias and neck pain.  Skin: Negative for rash.  Allergic/Immunologic: Negative for environmental  allergies.  Neurological: Negative for dizziness, tremors, seizures, weakness and headaches.  Hematological: Does not bruise/bleed easily.  Psychiatric/Behavioral: Negative for hallucinations and suicidal ideas. The patient is not nervous/anxious.      Social History   Tobacco Use  . Smoking status: Current Every Day Smoker    Packs/day: 0.25    Years: 15.00    Pack years: 3.75    Types: Cigarettes  . Smokeless tobacco: Never Used  Substance Use Topics  . Alcohol use: No    Objective:   BP 110/68   Pulse 99   Temp 97.9 F (36.6 C)   Wt 184 lb 8 oz (83.7 kg)   BMI 36.03 kg/m   Physical Exam  General Appearance:    Alert, cooperative, no distress, obese  Eyes:    PERRL, conjunctiva/corneas clear, EOM's intact       Lungs:     Clear to auscultation bilaterally, respirations unlabored  Heart:    Regular rate and rhythm  Neurologic:   Awake, alert, oriented x 3. No apparent focal neurological           defect.           Assessment & Plan:     1. Type 2 diabetes mellitus without complication, without long-term current use of insulin (HCC) Borderline. Will start metformin ER 50 and refer for diabetic education.   2. Hypothyroidism, unspecified type Not clear if she is currently taking 137.6 or . Will refill today at 125 and check TSH at follow up   Lab Results  Component Value Date   TSH 0.043 (L) 01/28/2018   Return in about 2 months (around 05/04/2018).        Mila Merry, MD

## 2018-03-04 NOTE — Addendum Note (Signed)
Addended by: Jules Husbands on: 03/04/2018 07:57 PM   Modules accepted: Orders

## 2018-04-01 ENCOUNTER — Telehealth: Payer: Self-pay

## 2018-04-01 NOTE — Telephone Encounter (Signed)
Pt called verifying appts. Lm with appt dates and times. Let me know she has to pcp appts that she probably will need to cancel on of those. Asked for a return call

## 2018-04-24 ENCOUNTER — Other Ambulatory Visit: Payer: Self-pay

## 2018-04-24 DIAGNOSIS — E119 Type 2 diabetes mellitus without complications: Secondary | ICD-10-CM

## 2018-04-24 DIAGNOSIS — E039 Hypothyroidism, unspecified: Secondary | ICD-10-CM

## 2018-04-25 LAB — TSH: TSH: 0.377 u[IU]/mL — AB (ref 0.450–4.500)

## 2018-04-25 LAB — HEMOGLOBIN A1C
ESTIMATED AVERAGE GLUCOSE: 134 mg/dL
HEMOGLOBIN A1C: 6.3 % — AB (ref 4.8–5.6)

## 2018-05-01 ENCOUNTER — Ambulatory Visit: Payer: Self-pay

## 2018-05-06 ENCOUNTER — Ambulatory Visit: Payer: Self-pay | Admitting: Urology

## 2018-05-06 VITALS — BP 104/63 | HR 94 | Temp 98.5°F | Ht 63.0 in | Wt 186.3 lb

## 2018-05-06 DIAGNOSIS — E039 Hypothyroidism, unspecified: Secondary | ICD-10-CM

## 2018-05-06 LAB — GLUCOSE, POCT (MANUAL RESULT ENTRY): POC Glucose: 167 mg/dl — AB (ref 70–99)

## 2018-05-06 MED ORDER — LEVOTHYROXINE SODIUM 112 MCG PO CAPS: 1.0000 | ORAL_CAPSULE | Freq: Every day | ORAL | 3 refills | Status: DC

## 2018-05-06 MED ORDER — ALLOPURINOL 100 MG PO TABS: 100.0000 mg | ORAL_TABLET | Freq: Every day | ORAL | 3 refills | Status: DC

## 2018-05-06 MED ORDER — IBUPROFEN 600 MG PO TABS: 600.0000 mg | ORAL_TABLET | Freq: Three times a day (TID) | ORAL | 3 refills | Status: DC | PRN

## 2018-05-06 MED ORDER — METFORMIN HCL 500 MG PO TABS: 500.0000 mg | ORAL_TABLET | Freq: Two times a day (BID) | ORAL | 3 refills | Status: DC

## 2018-05-06 NOTE — Progress Notes (Signed)
Patient: Amy Knapp Female    DOB: 1956-06-08   62 y.o.   MRN: [id number] Visit Date: 05/06/2018  Today's Provider: [facility]   Chief Complaint  Patient presents with  . Follow-up   Subjective:    HPI  TSH 0.377     HbgA1c 6.3%    Will reduce levothyroxine to 112 mcg from 125 mcg Will increase metformin 500 mg to bid from qd  Currently living in a boarding house which makes eating and cooking healthy challenging  Still having right shoulder pain since this winter        Allergies  Allergen Reactions  . Celery Oil   . Demerol [Meperidine]   . Morphine And Related Other (See Comments)    Muscle Spasms  . Penicillins    Previous Medications   ALBUTEROL (VENTOLIN HFA) 108 (90 BASE) MCG/ACT INHALER    Inhale 2 puffs into the lungs every 6 (six) hours as needed for wheezing or shortness of breath.   ALLOPURINOL (ZYLOPRIM) 100 MG TABLET    Take 1 tablet (100 mg total) by mouth daily.   ATORVASTATIN (LIPITOR) 20 MG TABLET    Take 1 tablet (20 mg total) by mouth daily.   CELECOXIB (CELEBREX) 200 MG CAPSULE    Take 1 capsule (200 mg total) by mouth 2 (two) times daily.   FLUTICASONE-SALMETEROL (ADVAIR) 250-50 MCG/DOSE AEPB    Inhale 1 puff into the lungs 2 (two) times daily.   HYDROCHLOROTHIAZIDE (MICROZIDE) 12.5 MG CAPSULE    Take 1 capsule (12.5 mg total) by mouth daily.   IBUPROFEN (ADVIL,MOTRIN) 600 MG TABLET    Take 1 tablet (600 mg total) by mouth every 8 (eight) hours as needed.   LEVOTHYROXINE (SYNTHROID, LEVOTHROID) 125 MCG TABLET    Take 1 tablet (125 mcg total) by mouth daily before breakfast.   METFORMIN (GLUCOPHAGE-XR) 500 MG 24 HR TABLET    Take 1 tablet (500 mg total) by mouth daily with breakfast.    Review of Systems  All other systems reviewed and are negative.   Social History   Tobacco Use  . Smoking status: Current Every Day Smoker    Packs/day: 0.25    Years: 15.00    Pack years: 3.75    Types: Cigarettes  . Smokeless  tobacco: Never Used  Substance Use Topics  . Alcohol use: No   Objective:   BP 104/63 (BP Location: Left Arm, Patient Position: Sitting, Cuff Size: Normal)   Pulse 94   Temp 98.5 F (36.9 C) (Oral)   Ht 5\' 3"  (1.6 m)   Wt 186 lb 4.8 oz (84.5 kg)   BMI 33.00 kg/m   Physical Exam Constitutional: Well nourished. Alert and oriented, No acute distress. HEENT: [place] AT, moist mucus membranes. Trachea midline, no masses. Cardiovascular: No clubbing, cyanosis, or edema. Respiratory: Normal respiratory effort, no increased work of breathing. Skin: No rashes, bruises or suspicious lesions. Lymph: No cervical or inguinal adenopathy. Neurologic: Grossly intact, no focal deficits, moving all 4 extremities. Psychiatric: Normal mood and affect.      Assessment & Plan:     1. DM Hbg A1c basically unchanged - will increase metformin to 500 mg bid Recheck Hgb A1c, CMP, Lipids and CBC in 3 months  2. Hypothyroidism Decrease levothyroxine to 112 mcg Recheck TSH in 3 months  3. Gout Refilled allopurinol Recheck uric acid in 3 months          [facility]   [facility]

## 2018-05-22 ENCOUNTER — Other Ambulatory Visit: Payer: Self-pay | Admitting: Adult Health Nurse Practitioner

## 2018-05-22 DIAGNOSIS — E039 Hypothyroidism, unspecified: Secondary | ICD-10-CM

## 2018-05-22 MED ORDER — LEVOTHYROXINE SODIUM 100 MCG PO TABS: 100.0000 ug | ORAL_TABLET | Freq: Every day | ORAL | 3 refills | Status: DC

## 2018-06-10 ENCOUNTER — Telehealth: Payer: Self-pay | Admitting: Pharmacist

## 2018-06-10 NOTE — Telephone Encounter (Signed)
06/10/2018 1:42:40 PM - Ventolin HFA refill  06/10/18 Placed refill online with [employer] for Ventolin HFA, to release 06/23/18, Order#[id number].Forde Radon

## 2018-07-29 ENCOUNTER — Telehealth: Payer: Self-pay | Admitting: Adult Health Nurse Practitioner

## 2018-07-29 NOTE — Telephone Encounter (Signed)
Called and left voicemail to reschedule 08/07/18 appointment. Asked patient to call back to reschedule.

## 2018-07-31 ENCOUNTER — Other Ambulatory Visit: Payer: Self-pay

## 2018-07-31 DIAGNOSIS — E039 Hypothyroidism, unspecified: Secondary | ICD-10-CM

## 2018-08-01 LAB — COMPREHENSIVE METABOLIC PANEL
ALT: 24 IU/L (ref 0–32)
AST: 26 IU/L (ref 0–40)
Albumin/Globulin Ratio: 1.7 (ref 1.2–2.2)
Albumin: 4.5 g/dL (ref 3.5–5.5)
Alkaline Phosphatase: 79 IU/L (ref 39–117)
BILIRUBIN TOTAL: 0.4 mg/dL (ref 0.0–1.2)
BUN/Creatinine Ratio: 12 (ref 9–23)
BUN: 9 mg/dL (ref 6–24)
CALCIUM: 10.1 mg/dL (ref 8.7–10.2)
CHLORIDE: 101 mmol/L (ref 96–106)
CO2: 25 mmol/L (ref 20–29)
Creatinine, Ser: 0.78 mg/dL (ref 0.57–1.00)
GFR calc Af Amer: 96 mL/min/{1.73_m2} (ref >59)
GFR, EST NON AFRICAN AMERICAN: 83 mL/min/{1.73_m2} (ref >59)
GLUCOSE: 83 mg/dL (ref 65–99)
Globulin, Total: 2.7 g/dL (ref 1.5–4.5)
Potassium: 4.2 mmol/L (ref 3.5–5.2)
Sodium: 142 mmol/L (ref 134–144)
TOTAL PROTEIN: 7.2 g/dL (ref 6.0–8.5)

## 2018-08-01 LAB — LIPID PANEL
Chol/HDL Ratio: 3.9 ratio (ref 0.0–4.4)
Cholesterol, Total: 268 mg/dL — ABNORMAL HIGH (ref 100–199)
HDL: 68 mg/dL (ref >39)
LDL Calculated: 171 mg/dL — ABNORMAL HIGH (ref 0–99)
TRIGLYCERIDES: 144 mg/dL (ref 0–149)
VLDL Cholesterol Cal: 29 mg/dL (ref 5–40)

## 2018-08-01 LAB — TSH: TSH: 33.82 u[IU]/mL — ABNORMAL HIGH (ref 0.450–4.500)

## 2018-08-01 LAB — CBC WITH DIFFERENTIAL/PLATELET
BASOS ABS: 0 10*3/uL (ref 0.0–0.2)
Basos: 0 %
EOS (ABSOLUTE): 0.1 10*3/uL (ref 0.0–0.4)
Eos: 1 %
HEMOGLOBIN: 14.5 g/dL (ref 11.1–15.9)
Hematocrit: 40.9 % (ref 34.0–46.6)
IMMATURE GRANS (ABS): 0 10*3/uL (ref 0.0–0.1)
IMMATURE GRANULOCYTES: 0 %
LYMPHS: 43 %
Lymphocytes Absolute: 3.2 10*3/uL — ABNORMAL HIGH (ref 0.7–3.1)
MCH: 30.6 pg (ref 26.6–33.0)
MCHC: 35.5 g/dL (ref 31.5–35.7)
MCV: 86 fL (ref 79–97)
MONOCYTES: 7 %
Monocytes Absolute: 0.5 10*3/uL (ref 0.1–0.9)
NEUTROS PCT: 49 %
Neutrophils Absolute: 3.5 10*3/uL (ref 1.4–7.0)
PLATELETS: 325 10*3/uL (ref 150–450)
RBC: 4.74 x10E6/uL (ref 3.77–5.28)
RDW: 12.3 % (ref 12.3–15.4)
WBC: 7.3 10*3/uL (ref 3.4–10.8)

## 2018-08-01 LAB — URIC ACID: URIC ACID: 4.5 mg/dL (ref 2.5–7.1)

## 2018-08-01 LAB — HEMOGLOBIN A1C
ESTIMATED AVERAGE GLUCOSE: 126 mg/dL
Hgb A1c MFr Bld: 6 % — ABNORMAL HIGH (ref 4.8–5.6)

## 2018-08-06 ENCOUNTER — Other Ambulatory Visit: Payer: Self-pay | Admitting: Gerontology

## 2018-08-06 DIAGNOSIS — E039 Hypothyroidism, unspecified: Secondary | ICD-10-CM

## 2018-08-06 NOTE — Progress Notes (Unsigned)
tsh

## 2018-08-07 ENCOUNTER — Ambulatory Visit: Payer: Self-pay

## 2018-08-19 ENCOUNTER — Ambulatory Visit: Payer: Self-pay | Admitting: Family Medicine

## 2018-08-19 VITALS — BP 116/64 | HR 85 | Temp 97.9°F | Ht 63.0 in | Wt 185.6 lb

## 2018-08-19 DIAGNOSIS — E119 Type 2 diabetes mellitus without complications: Secondary | ICD-10-CM

## 2018-08-19 DIAGNOSIS — Z09 Encounter for follow-up examination after completed treatment for conditions other than malignant neoplasm: Secondary | ICD-10-CM

## 2018-08-19 DIAGNOSIS — G8929 Other chronic pain: Secondary | ICD-10-CM

## 2018-08-19 DIAGNOSIS — M25511 Pain in right shoulder: Secondary | ICD-10-CM

## 2018-08-19 DIAGNOSIS — M109 Gout, unspecified: Secondary | ICD-10-CM

## 2018-08-19 DIAGNOSIS — I10 Essential (primary) hypertension: Secondary | ICD-10-CM

## 2018-08-19 MED ORDER — IBUPROFEN 600 MG PO TABS: 600.0000 mg | ORAL_TABLET | Freq: Three times a day (TID) | ORAL | 3 refills | Status: DC | PRN

## 2018-08-19 MED ORDER — ALLOPURINOL 100 MG PO TABS: 100.0000 mg | ORAL_TABLET | Freq: Every day | ORAL | 3 refills | Status: DC

## 2018-08-19 NOTE — Progress Notes (Signed)
Follow Up  Subjective:    Patient ID: Amy Knapp, female    DOB: May 25, 1956, 62 y.o.   MRN: [id number]   Chief Complaint  Patient presents with  . Results    Lab     . Shoulder Pain    R sided    HPI  Amy Knapp is a 62 year old female with a past medical history of Thyroid Disease, Sleep Apnea, Hyperlipidemia, Gout, GERD, Emphysema, Diabetes, Depression, COPD, Cataract, Broken Leg, Asthma, Arthritis, Anxiety, and Allergy. Amy Knapp is hear today for follow up.    Current Status: Since her last office visit, Amy Knapp is doing well with c/o: chronic right shoulder pain. Amy Knapp denies visual changes, chest pain, cough, shortness of breath, heart palpitations, and falls. Amy Knapp has occasionally headaches and dizziness with position changes. Denies severe headaches, confusion, seizures, double vision, and blurred vision, nausea and vomiting. Amy Knapp denies increased thirst, frequent urination, hunger, fatigue, blurred vision, excessive hunger, excessive thirst, weight gain, weight loss, and poor wound healing.   Amy Knapp denies fevers, chills, recent infections, weight loss, and night sweats. No reports of GI problems such as nausea, vomiting, diarrhea, and constipation. Amy Knapp has no reports of blood in stools, dysuria and hematuria. No depression or anxiety reported.   Review of Systems  Constitutional: Negative.   HENT: Negative.   Respiratory: Positive for shortness of breath (Occasional).   Cardiovascular: Negative.   Gastrointestinal: Positive for abdominal distention.  Genitourinary: Negative.   Musculoskeletal: Positive for arthralgias (right shoulder pain).  Skin: Negative.   Neurological: Positive for dizziness and headaches.  Psychiatric/Behavioral: Negative.        Objective:   Physical Exam  Constitutional: Amy Knapp is oriented to person, place, and time. Amy Knapp appears well-developed and well-nourished.  HENT:  Head: Normocephalic and atraumatic.  Eyes: Pupils are equal, round, and  reactive to light. Conjunctivae and EOM are normal.  Neck: Normal range of motion. Neck supple.  Cardiovascular: Normal rate, regular rhythm, normal heart sounds and intact distal pulses.  Pulmonary/Chest: Effort normal and breath sounds normal.  Abdominal: Soft. Bowel sounds are normal.  Musculoskeletal: Normal range of motion.  Neurological: Amy Knapp is alert and oriented to person, place, and time.  Skin: Skin is warm and dry.  Psychiatric: Amy Knapp has a normal mood and affect. Her behavior is normal. Judgment and thought content normal.  Vitals reviewed.  Assessment & Plan:   1. Essential hypertension Stable. Blood pressure is 116/64 today.  2. Type 2 diabetes mellitus without complication, without long-term current use of insulin (HCC) Hgb A1c is improved at 6.0 today from 6.3 on 04/24/2018. Amy Knapp will continue to decrease foods/beverages high in sugars and carbs and follow Heart Healthy or DASH diet. Increase physical activity to at least 30 minutes cardio exercise daily.   3. Gout of multiple sites, unspecified cause, unspecified chronicity - allopurinol (ZYLOPRIM) 100 MG tablet; Take 1 tablet (100 mg total) by mouth daily.  Dispense: 90 tablet; Refill: 3  4. Chronic right shoulder pai - ibuprofen (ADVIL,MOTRIN) 600 MG tablet; Take 1 tablet (600 mg total) by mouth every 8 (eight) hours as needed.  Dispense: 30 tablet; Refill: 3  5. Follow up Amy Knapp will follow up in 6 months.   Meds ordered this encounter  Medications  . ibuprofen (ADVIL,MOTRIN) 600 MG tablet    Sig: Take 1 tablet (600 mg total) by mouth every 8 (eight) hours as needed.    Dispense:  30 tablet    Refill:  3  . allopurinol (ZYLOPRIM)  100 MG tablet    Sig: Take 1 tablet (100 mg total) by mouth daily.    Dispense:  90 tablet    Refill:  3   Raliegh IpNatalie Gordan Grell,  MSN, [facility] LL[facility]NP-C Patient [facility] [address]  , [place] [id number] [phone]

## 2018-09-02 ENCOUNTER — Telehealth: Payer: Self-pay | Admitting: Pharmacist

## 2018-09-02 NOTE — Telephone Encounter (Signed)
09/02/2018 12:11:43 PM - Ventolin HFA refill  09/02/18 Placed refill online with [facility] for Ventolin HFA, to release 09/16/18, order# [id number].Forde RadonAJ

## 2018-11-26 ENCOUNTER — Other Ambulatory Visit: Payer: Self-pay | Admitting: Adult Health Nurse Practitioner

## 2018-12-15 ENCOUNTER — Other Ambulatory Visit: Payer: Self-pay

## 2018-12-15 ENCOUNTER — Ambulatory Visit: Payer: Self-pay | Attending: Oncology | Admitting: *Deleted

## 2018-12-15 ENCOUNTER — Encounter (INDEPENDENT_AMBULATORY_CARE_PROVIDER_SITE_OTHER): Payer: Self-pay

## 2018-12-15 ENCOUNTER — Encounter: Payer: Self-pay | Admitting: *Deleted

## 2018-12-15 ENCOUNTER — Ambulatory Visit
Admission: RE | Admit: 2018-12-15 | Discharge: 2018-12-15 | Disposition: A | Discharge status: Home / Self Care | Payer: Self-pay | Source: Ambulatory Visit | Attending: Oncology | Admitting: Oncology

## 2018-12-15 VITALS — BP 127/73 | HR 78 | Temp 98.1°F | Ht 63.0 in | Wt 183.0 lb

## 2018-12-15 DIAGNOSIS — Z0000 Encounter for general adult medical examination without abnormal findings: Secondary | ICD-10-CM | POA: Insufficient documentation

## 2018-12-15 NOTE — Progress Notes (Signed)
  Subjective:     Patient ID: Amy Knapp, female   DOB: Aug 17, 1956, 63 y.o.   MRN: [id number]  HPI   Review of Systems     Objective:   Physical Exam Chest:     Breasts:        Right: Tenderness present. No swelling, bleeding, inverted nipple, mass, nipple discharge or skin change.        Left: Tenderness present. No swelling, bleeding, inverted nipple, mass, nipple discharge or skin change.    Lymphadenopathy:     Upper Body:     Right upper body: No supraclavicular or axillary adenopathy.     Left upper body: No supraclavicular or axillary adenopathy.        Assessment:     63 year old White female returns to [facility] for annual screening.  Complains of bilateral breast pain.  States she has cut back on caffeine and it has gotten better.  On clinical breast exam bilateral breast are very tender to palpation at the outer quadrants.  There is no dominant mass, nipple discharge, skin changes or lymphadenopathy.  Taught self breast awareness.  Patient with a history of hysterectomy.  Pap smear omitted per protocol. Patient has been screened for eligibility.  She does not have any insurance, Medicare or Medicaid.  She also meets financial eligibility.  Hand-out given on the Affordable Care Act. Risk Assessment    Risk Scores      12/15/2018   Last edited by: Jim Like, RN   5-year risk: 1 %   Lifetime risk: 5.3 %            Plan:     Screening mammogram ordered.  Will follow-up per [facility] protocol.

## 2018-12-19 ENCOUNTER — Telehealth: Payer: Self-pay | Admitting: Pharmacist

## 2018-12-19 NOTE — Telephone Encounter (Signed)
12/19/2018 10:08:57 AM - Ventolin script to [facility]  12/19/2018 Patient has returned her portion of application signed with income. Sending script to [facility] for provider to sign for Ventolin HFA.Forde Radon

## 2018-12-22 ENCOUNTER — Telehealth: Payer: Self-pay | Admitting: Pharmacy Technician

## 2018-12-22 ENCOUNTER — Encounter: Payer: Self-pay | Admitting: *Deleted

## 2018-12-22 NOTE — Progress Notes (Signed)
Letter mailed from [facility] to inform patient of her normal mammogram results.  Patient is to follow-up with annual screening in one year.  HSIS to Christy. 

## 2018-12-22 NOTE — Telephone Encounter (Signed)
Still need 2019 tax return.  Patient aware that she must produce 2019 tax return by 01/21/2019.  Amy Knapp Care Manager [facility]

## 2019-01-09 ENCOUNTER — Telehealth: Payer: Self-pay | Admitting: Pharmacist

## 2019-01-09 NOTE — Telephone Encounter (Signed)
01/09/2019 11:17:38 AM - Ventolin HFA / [employer] renewal  01/09/2019 Faxed [employer] renewal application for Ventolin HFA Inhale 2 puffs every 6 hours as needed for wheezing or shortness of breath.Forde Radon

## 2019-02-10 ENCOUNTER — Other Ambulatory Visit: Payer: Self-pay

## 2019-02-12 ENCOUNTER — Ambulatory Visit: Payer: Self-pay | Admitting: Urology

## 2019-02-12 ENCOUNTER — Other Ambulatory Visit: Payer: Self-pay

## 2019-02-12 DIAGNOSIS — E785 Hyperlipidemia, unspecified: Secondary | ICD-10-CM

## 2019-02-12 DIAGNOSIS — J441 Chronic obstructive pulmonary disease with (acute) exacerbation: Secondary | ICD-10-CM

## 2019-02-12 DIAGNOSIS — M25511 Pain in right shoulder: Secondary | ICD-10-CM

## 2019-02-12 DIAGNOSIS — M109 Gout, unspecified: Secondary | ICD-10-CM

## 2019-02-12 DIAGNOSIS — E119 Type 2 diabetes mellitus without complications: Secondary | ICD-10-CM

## 2019-02-12 DIAGNOSIS — E039 Hypothyroidism, unspecified: Secondary | ICD-10-CM

## 2019-02-12 DIAGNOSIS — G8929 Other chronic pain: Secondary | ICD-10-CM

## 2019-02-12 MED ORDER — GLIPIZIDE 5 MG PO TABS: 5.0000 mg | ORAL_TABLET | Freq: Two times a day (BID) | ORAL | 2 refills | Status: DC

## 2019-02-12 MED ORDER — IBUPROFEN 600 MG PO TABS: 600.0000 mg | ORAL_TABLET | Freq: Three times a day (TID) | ORAL | 3 refills | Status: DC | PRN

## 2019-02-12 MED ORDER — LEVOTHYROXINE SODIUM 100 MCG PO TABS: ORAL_TABLET | ORAL | 0 refills | Status: DC

## 2019-02-12 NOTE — Progress Notes (Signed)
Virtual Visit via Telephone Note  I connected with Amy Knapp on 02/12/2019  at 1730 by telephone and verified that I am speaking with the correct person using two identifiers.  They are located at home.  I am located at my home.    This visit type was conducted due to national recommendations for restrictions regarding the COVID-19 Pandemic (e.g. social distancing).  This format is felt to be most appropriate for this patient at this time.  All issues noted in this document were discussed and addressed.  No physical exam was performed.   I discussed the limitations, risks, security and privacy concerns of performing an evaluation and management service by telephone and the availability of in person appointments. I also discussed with the patient that there may be a patient responsible charge related to this service. The patient expressed understanding and agreed to proceed.   History of Present Illness: Amy Knapp is a 63 year old female with DM, HLD, gout, hypothyroidism, COPD and sleep apnea who is contacted for a follow up visit.    DM - patient has not been taking her metformin x 2 months due to diarrhea - she does not check her sugars at home   Hypothyroidism - synthroid refilled and will need labs  Right shoulder pain - ibuprofen 600 mg is not helping - taking one daily - will increase to twice daily   Gout- left thumb with knot for two weeks   Sleep apnea - without CPAP machine   COPD- using albuterol/ADVAIR - no coughing spells during the night - has a "smokers cough" in the am    Observations/Objective:   Assessment and Plan: 1. DM - likely uncontrolled as she has not taken her metformin x 2 months - will start glipizide at 2.5 mg prior to breakfast for one week, then 5 mg prior to breakfast for one week and then 5 mg prior to breakfast and prior to dinner -has follow up labs in two weeks - will check HbgA1c and expect it will be elevated  2. Gout - will  check uric acid level - ? Gout in left thumb  3. Sleep apnea - no CPAP  4. COPD - still smoking - controlled with ADVAIR/albuterol  5. Right shoulder pain - fill out [facility] forms for referral to [facility] orthopedics  6. Hypothyroidism - refilled synthroid - will check thyroid levels   Follow Up Instructions:  She will follow up one week after labs to review labs.    I discussed the assessment and treatment plan with the patient. The patient was provided an opportunity to ask questions and all were answered. The patient agreed with the plan and demonstrated an understanding of the instructions.   The patient was advised to call back or seek an in-person evaluation if the symptoms worsen or if the condition fails to improve as anticipated.  I provided 20 minutes of non-face-to-face time during this encounter.   Lenise Jr, PA-C

## 2019-02-17 ENCOUNTER — Ambulatory Visit: Payer: Self-pay

## 2019-02-26 ENCOUNTER — Other Ambulatory Visit: Payer: Self-pay

## 2019-02-26 DIAGNOSIS — E119 Type 2 diabetes mellitus without complications: Secondary | ICD-10-CM

## 2019-02-26 DIAGNOSIS — E039 Hypothyroidism, unspecified: Secondary | ICD-10-CM

## 2019-02-27 LAB — CBC WITH DIFFERENTIAL/PLATELET
Basophils Absolute: 0 10*3/uL (ref 0.0–0.2)
Basos: 1 %
EOS (ABSOLUTE): 0.2 10*3/uL (ref 0.0–0.4)
Eos: 3 %
Hematocrit: 40.5 % (ref 34.0–46.6)
Hemoglobin: 13.9 g/dL (ref 11.1–15.9)
Immature Grans (Abs): 0 10*3/uL (ref 0.0–0.1)
Immature Granulocytes: 0 %
Lymphocytes Absolute: 3.3 10*3/uL — ABNORMAL HIGH (ref 0.7–3.1)
Lymphs: 42 %
MCH: 30.5 pg (ref 26.6–33.0)
MCHC: 34.3 g/dL (ref 31.5–35.7)
MCV: 89 fL (ref 79–97)
Monocytes Absolute: 0.8 10*3/uL (ref 0.1–0.9)
Monocytes: 11 %
Neutrophils Absolute: 3.4 10*3/uL (ref 1.4–7.0)
Neutrophils: 43 %
Platelets: 297 10*3/uL (ref 150–450)
RBC: 4.55 x10E6/uL (ref 3.77–5.28)
RDW: 12 % (ref 11.7–15.4)
WBC: 7.7 10*3/uL (ref 3.4–10.8)

## 2019-02-27 LAB — COMPREHENSIVE METABOLIC PANEL
ALT: 18 IU/L (ref 0–32)
AST: 19 IU/L (ref 0–40)
Albumin/Globulin Ratio: 1.7 (ref 1.2–2.2)
Albumin: 4.3 g/dL (ref 3.8–4.9)
Alkaline Phosphatase: 73 IU/L (ref 39–117)
BUN/Creatinine Ratio: 21 (ref 12–28)
BUN: 17 mg/dL (ref 8–27)
Bilirubin Total: <0.2 mg/dL (ref 0.0–1.2)
CO2: 25 mmol/L (ref 20–29)
Calcium: 9.3 mg/dL (ref 8.7–10.3)
Chloride: 104 mmol/L (ref 96–106)
Creatinine, Ser: 0.8 mg/dL (ref 0.57–1.00)
GFR calc Af Amer: 93 mL/min/{1.73_m2} (ref >59)
GFR calc non Af Amer: 80 mL/min/{1.73_m2} (ref >59)
Globulin, Total: 2.6 g/dL (ref 1.5–4.5)
Glucose: 120 mg/dL — ABNORMAL HIGH (ref 65–99)
Potassium: 4.1 mmol/L (ref 3.5–5.2)
Sodium: 141 mmol/L (ref 134–144)
Total Protein: 6.9 g/dL (ref 6.0–8.5)

## 2019-02-27 LAB — TSH: TSH: 3.84 u[IU]/mL (ref 0.450–4.500)

## 2019-02-27 LAB — HEMOGLOBIN A1C
Est. average glucose Bld gHb Est-mCnc: 128 mg/dL
Hgb A1c MFr Bld: 6.1 % — ABNORMAL HIGH (ref 4.8–5.6)

## 2019-02-27 LAB — LIPID PANEL
Chol/HDL Ratio: 4.6 ratio — ABNORMAL HIGH (ref 0.0–4.4)
Cholesterol, Total: 247 mg/dL — ABNORMAL HIGH (ref 100–199)
HDL: 54 mg/dL (ref >39)
LDL Calculated: 155 mg/dL — ABNORMAL HIGH (ref 0–99)
Triglycerides: 188 mg/dL — ABNORMAL HIGH (ref 0–149)
VLDL Cholesterol Cal: 38 mg/dL (ref 5–40)

## 2019-02-27 LAB — MICROALBUMIN / CREATININE URINE RATIO
Creatinine, Urine: 68.4 mg/dL
Microalb/Creat Ratio: 31 mg/g creat — ABNORMAL HIGH (ref 0–29)
Microalbumin, Urine: 20.9 ug/mL

## 2019-02-27 LAB — URIC ACID: Uric Acid: 4.2 mg/dL (ref 2.5–7.1)

## 2019-02-27 LAB — T4, FREE: Free T4: 1.11 ng/dL (ref 0.82–1.77)

## 2019-02-28 ENCOUNTER — Other Ambulatory Visit: Payer: Self-pay | Admitting: Gerontology

## 2019-02-28 DIAGNOSIS — E785 Hyperlipidemia, unspecified: Secondary | ICD-10-CM

## 2019-02-28 MED ORDER — ATORVASTATIN CALCIUM 10 MG PO TABS: 10.0000 mg | ORAL_TABLET | Freq: Every day | ORAL | 0 refills | Status: DC

## 2019-03-05 ENCOUNTER — Ambulatory Visit: Payer: Self-pay

## 2019-03-12 ENCOUNTER — Ambulatory Visit: Payer: Self-pay | Admitting: Urology

## 2019-03-12 ENCOUNTER — Encounter: Payer: Self-pay | Admitting: Urology

## 2019-03-12 ENCOUNTER — Other Ambulatory Visit: Payer: Self-pay

## 2019-03-12 DIAGNOSIS — E119 Type 2 diabetes mellitus without complications: Secondary | ICD-10-CM

## 2019-03-12 NOTE — Progress Notes (Signed)
Virtual Visit via Telephone Note  I connected with Lorin Picket on 03/12/19 at  7:00 PM EDT by telephone and verified that I am speaking with the correct person using two identifiers.  Location: Patient: Home  Provider: [facility]   I discussed the limitations, risks, security and privacy concerns of performing an evaluation and management service by telephone and the availability of in person appointments. I also discussed with the patient that there may be a patient responsible charge related to this service. The patient expressed understanding and agreed to proceed.   History of Present Illness: DM- HbgA1c 6.1%  HLD- started on atorvastatin 10 mg daily  Hypothyroidism - TSH normal - continue synthroid  Gout - uric acid level normal  Has no complaints at this time.       Observations/Objective: Patient does not sound distressed and is answering questions appropriately.    Assessment and Plan:  1. DM  Doing well  RTC in three months for labs and office visit   2. HLD  Continue atorvastatin 10 mg daily  RTC in three months for labs and office visit   Follow Up Instructions:  Mrs. Jeppesen will follow up in three months for labs office.      I discussed the assessment and treatment plan with the patient. The patient was provided an opportunity to ask questions and all were answered. The patient agreed with the plan and demonstrated an understanding of the instructions.   The patient was advised to call back or seek an in-person evaluation if the symptoms worsen or if the condition fails to improve as anticipated.  I provided 10 minutes of non-face-to-face time during this encounter.   Kodey Xue, PA-C

## 2019-04-03 ENCOUNTER — Telehealth: Payer: Self-pay | Admitting: Pharmacist

## 2019-04-03 NOTE — Telephone Encounter (Signed)
04/03/2019 2:04:28 PM - Ventolin refill online  04/03/2019 I have placed refill online with [facility] for Ventolin HFA, to ship 04/17/2019, order# [id number].Amy Knapp

## 2019-06-11 ENCOUNTER — Other Ambulatory Visit: Payer: Self-pay

## 2019-06-11 ENCOUNTER — Other Ambulatory Visit: Payer: Self-pay | Admitting: Urology

## 2019-06-11 DIAGNOSIS — E119 Type 2 diabetes mellitus without complications: Secondary | ICD-10-CM

## 2019-06-16 ENCOUNTER — Other Ambulatory Visit: Payer: Self-pay | Admitting: Gerontology

## 2019-06-16 DIAGNOSIS — E119 Type 2 diabetes mellitus without complications: Secondary | ICD-10-CM

## 2019-06-16 DIAGNOSIS — E785 Hyperlipidemia, unspecified: Secondary | ICD-10-CM

## 2019-06-18 ENCOUNTER — Ambulatory Visit: Payer: Self-pay

## 2019-06-18 ENCOUNTER — Other Ambulatory Visit: Payer: Self-pay

## 2019-06-18 DIAGNOSIS — E785 Hyperlipidemia, unspecified: Secondary | ICD-10-CM

## 2019-06-18 DIAGNOSIS — E119 Type 2 diabetes mellitus without complications: Secondary | ICD-10-CM

## 2019-06-19 LAB — LIPID PANEL
Chol/HDL Ratio: 6.2 ratio — ABNORMAL HIGH (ref 0.0–4.4)
Cholesterol, Total: 298 mg/dL — ABNORMAL HIGH (ref 100–199)
HDL: 48 mg/dL (ref >39)
LDL Chol Calc (NIH): 187 mg/dL — ABNORMAL HIGH (ref 0–99)
Triglycerides: 322 mg/dL — ABNORMAL HIGH (ref 0–149)
VLDL Cholesterol Cal: 63 mg/dL — ABNORMAL HIGH (ref 5–40)

## 2019-06-19 LAB — HEMOGLOBIN A1C
Est. average glucose Bld gHb Est-mCnc: 140 mg/dL
Hgb A1c MFr Bld: 6.5 % — ABNORMAL HIGH (ref 4.8–5.6)

## 2019-06-25 ENCOUNTER — Other Ambulatory Visit: Payer: Self-pay

## 2019-06-25 ENCOUNTER — Ambulatory Visit: Payer: Self-pay | Admitting: Urology

## 2019-06-25 DIAGNOSIS — E785 Hyperlipidemia, unspecified: Secondary | ICD-10-CM

## 2019-06-25 DIAGNOSIS — M109 Gout, unspecified: Secondary | ICD-10-CM

## 2019-06-25 MED ORDER — TRAZODONE HCL 50 MG PO TABS: 50.0000 mg | ORAL_TABLET | Freq: Every day | ORAL | 0 refills | Status: DC

## 2019-06-25 MED ORDER — ALLOPURINOL 100 MG PO TABS: 100.0000 mg | ORAL_TABLET | Freq: Every day | ORAL | 3 refills | Status: DC

## 2019-06-25 MED ORDER — ALBUTEROL SULFATE HFA 108 (90 BASE) MCG/ACT IN AERS: 2.0000 | INHALATION_SPRAY | Freq: Four times a day (QID) | RESPIRATORY_TRACT | 3 refills | Status: DC | PRN

## 2019-06-25 MED ORDER — LEVOTHYROXINE SODIUM 100 MCG PO TABS: ORAL_TABLET | ORAL | 0 refills | Status: DC

## 2019-06-25 MED ORDER — FLUTICASONE-SALMETEROL 250-50 MCG/DOSE IN AEPB: 1.0000 | INHALATION_SPRAY | Freq: Two times a day (BID) | RESPIRATORY_TRACT | 4 refills | Status: DC

## 2019-06-25 MED ORDER — ATORVASTATIN CALCIUM 10 MG PO TABS: 10.0000 mg | ORAL_TABLET | Freq: Every day | ORAL | 0 refills | Status: DC

## 2019-06-25 NOTE — Progress Notes (Signed)
  Patient: Amy Knapp Female    DOB: 1956-08-07   63 y.o.   MRN: [id number] Visit Date: 06/25/2019  Today's Provider: Mobile   Chief Complaint  Patient presents with  . Follow-up    Bloodwork   Subjective:    HPI Worked at [employer] and was laid off on 04/17/2019 due to the pandemic - only gets $180 a week for unemployment  Has Food Stamps $80 dollars every two weeks - daughter and husband living with her and she is feeding them as well  Has a car - but has car payments, insurance and gas    Allergies  Allergen Reactions  . Celery Oil     itching  . Demerol [Meperidine]     Burning, feels hot  . Morphine And Related Other (See Comments)    Muscle Spasms  . Penicillins     blisters   Previous Medications   ALBUTEROL (VENTOLIN HFA) 108 (90 BASE) MCG/ACT INHALER    Inhale 2 puffs into the lungs every 6 (six) hours as needed for wheezing or shortness of breath.   ALLOPURINOL (ZYLOPRIM) 100 MG TABLET    Take 1 tablet (100 mg total) by mouth daily.   ATORVASTATIN (LIPITOR) 10 MG TABLET    Take 1 tablet (10 mg total) by mouth daily.   FLUTICASONE-SALMETEROL (ADVAIR) 250-50 MCG/DOSE AEPB    Inhale 1 puff into the lungs 2 (two) times daily.   GLIPIZIDE (GLUCOTROL) 5 MG TABLET    Take 1 tablet (5 mg total) by mouth 2 (two) times daily before a meal.   IBUPROFEN (ADVIL) 600 MG TABLET    Take 1 tablet (600 mg total) by mouth every 8 (eight) hours as needed.   LEVOTHYROXINE (SYNTHROID) 100 MCG TABLET    TAKE ONE TABLET BY MOUTH EVERY DAY BEFORE BREAKFAST.   METFORMIN (GLUCOPHAGE) 500 MG TABLET    Take 1 tablet (500 mg total) by mouth 2 (two) times daily with a meal.    Review of Systems  Social History   Tobacco Use  . Smoking status: Current Every Day Smoker    Packs/day: 0.33    Years: 15.00    Pack years: 4.95    Types: Cigarettes  . Smokeless tobacco: Never Used  Substance Use Topics  . Alcohol use: No   Objective:   BP (!) 144/89    Pulse 95   Ht 5\' 2"  (1.575 m) Comment: self-reported  Wt 194 lb 6.4 oz (88.2 kg)   SpO2 93%   BMI 35.56 kg/m   Physical Exam      Assessment & Plan:     1. HLD  Restart Lipitor   Check lipids in one month   2. DM  Stable HgbA1c  3. Hypothyroidism  Continue levothyroxine  4. COPD  Inhalers refilled  5. Depression/Anxiety  Refer to [facility] sent for trazodone 50 mg qhs            [facility] [place]

## 2019-06-26 ENCOUNTER — Telehealth: Payer: Self-pay | Admitting: Pharmacist

## 2019-06-26 NOTE — Telephone Encounter (Signed)
06/26/2019 4:02:00 PM - Advair 250/50 New med to provider  06/26/2019 Received pharmacy printout for New Med- Advair 250/50 Inhale 1 puff into the lungs two times a day, rinse mouth & spit after each use, Sending script to [facility] for Shannon to sign. Patient is already enrolled with [employer].Delos Haring

## 2019-07-02 ENCOUNTER — Ambulatory Visit: Payer: Self-pay | Admitting: Licensed Clinical Social Worker

## 2019-07-06 ENCOUNTER — Telehealth: Payer: Self-pay | Admitting: Pharmacist

## 2019-07-06 NOTE — Telephone Encounter (Signed)
07/06/2019 2:24:41 PM - Ventolin refill online  07/06/2019 Placed refill online with [facility] for Ventolin, to ship 07/17/2019, Order# [id number].Delos Haring

## 2019-07-16 ENCOUNTER — Ambulatory Visit: Payer: Self-pay | Admitting: Licensed Clinical Social Worker

## 2019-07-16 ENCOUNTER — Ambulatory Visit: Payer: Self-pay | Admitting: Family Medicine

## 2019-07-16 ENCOUNTER — Other Ambulatory Visit: Payer: Self-pay

## 2019-07-16 VITALS — BP 131/71 | HR 85 | Temp 97.7°F | Ht 63.0 in | Wt 197.8 lb

## 2019-07-16 DIAGNOSIS — F17200 Nicotine dependence, unspecified, uncomplicated: Secondary | ICD-10-CM

## 2019-07-16 DIAGNOSIS — F4381 Prolonged grief disorder: Secondary | ICD-10-CM

## 2019-07-16 DIAGNOSIS — F4321 Adjustment disorder with depressed mood: Secondary | ICD-10-CM

## 2019-07-16 DIAGNOSIS — F4323 Adjustment disorder with mixed anxiety and depressed mood: Secondary | ICD-10-CM

## 2019-07-16 DIAGNOSIS — Z23 Encounter for immunization: Secondary | ICD-10-CM

## 2019-07-16 DIAGNOSIS — M25512 Pain in left shoulder: Secondary | ICD-10-CM

## 2019-07-16 DIAGNOSIS — J441 Chronic obstructive pulmonary disease with (acute) exacerbation: Secondary | ICD-10-CM

## 2019-07-16 MED ORDER — CHANTIX STARTING MONTH PAK 0.5 MG X 11 & 1 MG X 42 PO TABS: ORAL_TABLET | ORAL | 0 refills | Status: DC

## 2019-07-16 MED ORDER — MELOXICAM 7.5 MG PO TABS: 7.5000 mg | ORAL_TABLET | Freq: Every day | ORAL | 0 refills | Status: DC

## 2019-07-16 MED ORDER — ALBUTEROL SULFATE HFA 108 (90 BASE) MCG/ACT IN AERS: 2.0000 | INHALATION_SPRAY | Freq: Four times a day (QID) | RESPIRATORY_TRACT | 3 refills | Status: DC | PRN

## 2019-07-16 NOTE — Patient Instructions (Signed)
Steps to Quit Smoking Smoking tobacco is the leading cause of preventable death. It can affect almost every organ in the body. Smoking puts you and people around you at risk for many serious, long-lasting (chronic) diseases. Quitting smoking can be hard, but it is one of the best things that you can do for your health. It is never too late to quit. How do I get ready to quit? When you decide to quit smoking, make a plan to help you succeed. Before you quit:  Pick a date to quit. Set a date within the next 2 weeks to give you time to prepare.  Write down the reasons why you are quitting. Keep this list in places where you will see it often.  Tell your family, friends, and co-workers that you are quitting. Their support is important.  Talk with your doctor about the choices that may help you quit.  Find out if your health insurance will pay for these treatments.  Know the people, places, things, and activities that make you want to smoke (triggers). Avoid them. What first steps can I take to quit smoking?  Throw away all cigarettes at home, at work, and in your car.  Throw away the things that you use when you smoke, such as ashtrays and lighters.  Clean your car. Make sure to empty the ashtray.  Clean your home, including curtains and carpets. What can I do to help me quit smoking? Talk with your doctor about taking medicines and seeing a counselor at the same time. You are more likely to succeed when you do both.  If you are pregnant or breastfeeding, talk with your doctor about counseling or other ways to quit smoking. Do not take medicine to help you quit smoking unless your doctor tells you to do so. To quit smoking: Quit right away  Quit smoking totally, instead of slowly cutting back on how much you smoke over a period of time.  Go to counseling. You are more likely to quit if you go to counseling sessions regularly. Take medicine You may take medicines to help you quit. Some  medicines need a prescription, and some you can buy over-the-counter. Some medicines may contain a drug called nicotine to replace the nicotine in cigarettes. Medicines may:  Help you to stop having the desire to smoke (cravings).  Help to stop the problems that come when you stop smoking (withdrawal symptoms). Your doctor may ask you to use:  Nicotine patches, gum, or lozenges.  Nicotine inhalers or sprays.  Non-nicotine medicine that is taken by mouth. Find resources Find resources and other ways to help you quit smoking and remain smoke-free after you quit. These resources are most helpful when you use them often. They include:  Online chats with a counselor.  Phone quitlines.  Printed self-help materials.  Support groups or group counseling.  Text messaging programs.  Mobile phone apps. Use apps on your mobile phone or tablet that can help you stick to your quit plan. There are many free apps for mobile phones and tablets as well as websites. Examples include Quit Guide from the CDC and smokefree.gov  What things can I do to make it easier to quit?   Talk to your family and friends. Ask them to support and encourage you.  Call a phone quitline (1-800-QUIT-NOW), reach out to support groups, or work with a counselor.  Ask people who smoke to not smoke around you.  Avoid places that make you want to smoke,   such as: ? Bars. ? Parties. ? Smoke-break areas at work.  Spend time with people who do not smoke.  Lower the stress in your life. Stress can make you want to smoke. Try these things to help your stress: ? Getting regular exercise. ? Doing deep-breathing exercises. ? Doing yoga. ? Meditating. ? Doing a body scan. To do this, close your eyes, focus on one area of your body at a time from head to toe. Notice which parts of your body are tense. Try to relax the muscles in those areas. How will I feel when I quit smoking? Day 1 to 3 weeks Within the first 24 hours,  you may start to have some problems that come from quitting tobacco. These problems are very bad 2-3 days after you quit, but they do not often last for more than 2-3 weeks. You may get these symptoms:  Mood swings.  Feeling restless, nervous, angry, or annoyed.  Trouble concentrating.  Dizziness.  Strong desire for high-sugar foods and nicotine.  Weight gain.  Trouble pooping (constipation).  Feeling like you may vomit (nausea).  Coughing or a sore throat.  Changes in how the medicines that you take for other issues work in your body.  Depression.  Trouble sleeping (insomnia). Week 3 and afterward After the first 2-3 weeks of quitting, you may start to notice more positive results, such as:  Better sense of smell and taste.  Less coughing and sore throat.  Slower heart rate.  Lower blood pressure.  Clearer skin.  Better breathing.  Fewer sick days. Quitting smoking can be hard. Do not give up if you fail the first time. Some people need to try a few times before they succeed. Do your best to stick to your quit plan, and talk with your doctor if you have any questions or concerns. Summary  Smoking tobacco is the leading cause of preventable death. Quitting smoking can be hard, but it is one of the best things that you can do for your health.  When you decide to quit smoking, make a plan to help you succeed.  Quit smoking right away, not slowly over a period of time.  When you start quitting, seek help from your doctor, family, or friends. This information is not intended to replace advice given to you by your health care provider. Make sure you discuss any questions you have with your health care provider. Document Released: 07/21/2009 Document Revised: 12/12/2018 Document Reviewed: 12/13/2018 Elsevier Patient Education  2020 Elsevier Inc.  

## 2019-07-16 NOTE — BH Specialist Note (Signed)
Integrated Behavioral Health Comprehensive Clinical Assessment Via Phone  MRN: [id number] Name: Amy Knapp  Type of Service: Integrated Behavioral Health-Individual Interpretor: No. Interpretor Name and Language: Not applicable.   PRESENTING CONCERNS: Amy Knapp is a 63 y.o. female accompanied by herself.[facility] for Michiel CowboyShannon McGowan PA-C.  Previous mental health services Have you ever been treated for a mental health problem? Yes If "Yes", when were you treated and whom did you see? Per Amy Knapp she has been treated for depression in the past. She has seen a therapist before after her ex husband died but felt it was not helpful. She notes that she has been prescribed anti depressants in the past but stopped taking them because they made her sick. She is unable to remember the names, dosages, or the provider that prescribed the medications to her.  Have you ever been hospitalized for mental health treatment? Negative Have you ever been treated for any of the following? Past Psychiatric History/Hospitalization(s): Anxiety: Yes Amy Knapp describes experiencing stress in the last month due to her daughter having to live with her and being laid off from her job on July 10th due to COVID 19. She notes that she is collecting unemployment and plans to start looking for another job. Her symptoms include: feeling anxious, nervous, or on edge over half the days, not being able to stop or control her worrying, worrying too much about different things, difficulty relaxing, restlessness, and irritability.  Bipolar Disorder: Negative Depression: Yes Amy Knapp reports that she has experienced depression in the past after her ex husband passed away and she was there for him before he died. Her most recent episode of depression is due to job loss. Her symptoms include: feeling down and depressed nearly  everyday, loss of interest in previously enjoyed activities, fatigue, poor appetite, restlessness, and difficulty concentrating. She denies suicidal and homicidal thoughts. There is not access to a fire arm in the home.  Mania: Negative Psychosis: Negative Schizophrenia: Negative Personality Disorder: Negative Hospitalization for psychiatric illness: Negative History of Electroconvulsive Shock Therapy: Negative Prior Suicide Attempts: Negative Have you ever had thoughts of harming yourself or others or attempted suicide? No plan to harm self or others  Medical history  has a past medical history of Allergy, Anxiety, Arthritis, Asthma, Broken leg (2007), Cataract, COPD (chronic obstructive pulmonary disease) (HCC), Depression, Diabetes mellitus without complication (HCC), Emphysema of lung (HCC), GERD (gastroesophageal reflux disease), Gout, Hyperlipidemia, Sleep apnea, and Thyroid disease. Primary Care Physician: Dorothey BasemanBronstein, David, MD Date of last physical exam:  Allergies:  Allergies  Allergen Reactions  . Celery Oil     itching  . Demerol [Meperidine]     Burning, feels hot  . Morphine And Related Other (See Comments)    Muscle Spasms  . Penicillins     blisters   Current medications:  Outpatient Encounter Medications as of 07/16/2019  Medication Sig  . albuterol (VENTOLIN HFA) 108 (90 Base) MCG/ACT inhaler Inhale 2 puffs into the lungs every 6 (six) hours as needed for wheezing or shortness of breath.  . allopurinol (ZYLOPRIM) 100 MG tablet Take 1 tablet (100 mg total) by mouth daily.  [name]. atorvastatin (LIPITOR) 10 MG tablet Take 1 tablet (10 mg total) by mouth daily.  . Fluticasone-Salmeterol (ADVAIR) 250-50 MCG/DOSE AEPB Inhale 1 puff into the lungs 2 (two) times daily.  [name]. glipiZIDE (GLUCOTROL) 5 MG tablet Take 1 tablet (5 mg total) by mouth 2 (two)  times daily before a meal.  . ibuprofen (ADVIL) 600 MG tablet Take 1 tablet (600 mg total) by mouth every 8 (eight) hours as needed.   [name] levothyroxine (SYNTHROID) 100 MCG tablet TAKE ONE TABLET BY MOUTH EVERY DAY BEFORE BREAKFAST.  . metFORMIN (GLUCOPHAGE) 500 MG tablet Take 1 tablet (500 mg total) by mouth 2 (two) times daily with a meal. (Patient not taking: Reported on 08/19/2018)  . traZODone (DESYREL) 50 MG tablet Take 1 tablet (50 mg total) by mouth at bedtime.   No facility-administered encounter medications on file as of 07/16/2019.    Have you ever had any serious medication reactions? Yes- Amy Knapp has had adverse drug reactions to Demerol, Morphine, and Penicillin. She is allergic to celery oil.  Is there any history of mental health problems or substance abuse in your family? No Has anyone in your family been hospitalized for mental health treatment? No  Social/family history Who lives in your current household? Amy Knapp lives alone.  What is your family of origin, childhood history? Amy Knapp was born in [place]. Where were you born? See above. Where did you grow up? See above. How many different homes have you lived in? A few. Describe your childhood: Amy Knapp reports that she was raised by her maternal grandmother and her mother. She notes that she did not have the most pleasant childhood. She explains that she thinks it could have been worse. She explains that her dad was not really involved in her life and never acknowledged that she was his child. She notes that her mom and grandma would drink.  Do you have siblings, step/half siblings? Yes- She notes that she has two half sisters and one half brother. She notes that each of them have the same mom but different dad's.  What are their names, relation, sex, age? See above.  Are your parents separated or divorced? No What are your social supports? Amy Knapp denies that she has any support. She is a member of the [employer] in [place].   Education How many grades have you completed? 12th grade Did you have any problems in  school? No  Employment/financial issues Amy Knapp previously worked at an [employer] for the last two years until she was laid off due to COVID 19. She is collecting unemployment benefits.   Sleep Usual bedtime varies.  Sleeping arrangements: alone.  Problems with snoring: No Obstructive sleep apnea is not a concern. Per the patient she has a history of sleep apnea but was told she does not need to use a C PAP machine anymore.  Problems with nightmares: No Problems with night terrors: No Problems with sleepwalking: No  Trauma/Abuse history Have you ever experienced or been exposed to any form of abuse? No Have you ever experienced or been exposed to something traumatic? No  Substance use Do you use alcohol, nicotine or caffeine? tobacco use: Per patient smokes 5.5 cigarettes a day.  How old were you when you first tasted alcohol? Amy Knapp denies using or abusing alcohol or other drugs.  Have you ever used illicit drugs or abused prescription medications? Amy Knapp denies ever being in any form of substance abuse treatment.   Mental status General appearance/Behavior: Unknown due to assessment completed over the phone. Eye contact: Absent due to assessment completed over the phone. Motor behavior: unable to assess due to assessment conducted over the phone. Speech: Normal Level of consciousness: Alert Mood: Irritable Affect: Appropriate Anxiety level: Minimal Thought  process: Coherent Thought content: WNL Perception: Normal Judgment: Fair Insight: Present  Diagnosis No diagnosis found.  GOALS ADDRESSED: Patient will reduce symptoms of: stress and increase knowledge and/or ability of: coping skills, healthy habits, self-management skills and stress reduction and also: Increase healthy adjustment to current life circumstances              INTERVENTIONS: Interventions utilized: Psychoeducation and/or Health Education Standardized Assessments completed: GAD-7  and PHQ 9   ASSESSMENT/OUTCOME:  Cianna Kasparian is a 63 year old Caucasian female who presents today for a mental health assessment via phone and was referred by Zara Council PA-C. Ms. Tramontana has been experiencing depression on and off since her ex husband passed away from colon cancer. She has been experiencing anxiety since she was laid off from her job due to [place] 19 on July 10th. She describes experiencing stress as well due to her daughter being living with her for the past month but has since moved out. Per the patient, she previously saw a therapist in the past but it was not helpful and dealt with things on her own. She denies being hospitalized for mental illness. She denies any history of substance abuse.   Stephana has a history of COPD, sleep apnea, hypothyroidism, diabetes, Gout, and hyperlipidemia. Per the patient, she was not told by a doctor that she no longer needs to use her CPAP machine. She is an established patient at [facility]. She has a history of adverse drug reactions to Penicillins, Demerol, and Morphine. She is allergic to celery oil. Per the patient she smokes 5.5 cigarettes a day.   Cacie was laid off from her job at [employer] on July 10th 2020 due to [place] and receives unemployment. She lives alone in a rented apartment. She receives 80.00 every two weeks in food stamps. She was previously married and divorced her husband after twelve years. Her ex husband passed seven months ago and took care of him while he was sick. She has two children that are living, 52, and 42. She has two children that are deceased. She miscarried with her first child and her other child was killed by an aim track back on January 14th 2012 at the age of 63. She has six grandchildren. She notes that her youngest grandchild was adopted. She explains that she is unable to see the three oldest because of their dad. She can see her son's daughter when her daughter in law has time. She denies  having anyone in her support system. She denies any history of mental illness or substance abuse in the family.  PLAN:  Case consultation with Dr. Octavia Heir, MD, psychiatric consultant on 07/28/2019 who recommended that [name] continue on Trazodone 50 mg at bedtime.  Scheduled next visit: October 29th 2020 @ 5 pm.   [place] [employer]

## 2019-07-16 NOTE — Progress Notes (Signed)
Established Patient Office Visit  Subjective:  Patient ID: Amy Knapp, female    DOB: [birth year]  Age: 63 y.o. MRN: [id number]  CC:  Chief Complaint  Patient presents with  . Follow-up    Tuesday and Thursday morning had coughing spell, sputum had blood in it. Needs guidance on quitting smoking  . Shoulder Pain    Shoulder pain for about three weeks in left side.     HPI Amy Knapp presents for smoking cessation and left shoulder pain. Patient is a current everyday smoker who would like to quit. She states that she has noticed an increase in shortness of breath and sputum production. Has had 3 episodes of pink tinged sputum. Denies chest pain.  Left shoulder pain for the past week. She states that she has a history of arthritis. Has taken aspirin with temporary relief. Denies injury to the area.   Past Medical History:  Diagnosis Date  . Allergy   . Anxiety   . Arthritis   . Asthma   . Broken leg 2007   Right  . Cataract   . COPD (chronic obstructive pulmonary disease) (HCC)   . Depression   . Diabetes mellitus without complication (HCC)   . Emphysema of lung (HCC)   . GERD (gastroesophageal reflux disease)   . Gout   . Hyperlipidemia   . Sleep apnea   . Thyroid disease     Past Surgical History:  Procedure Laterality Date  . ABDOMINAL HYSTERECTOMY  2000   Partial  . LEG SURGERY Right 10/08/2005   broken right leg  . TUBAL LIGATION      Family History  Problem Relation Age of Onset  . Cancer Mother   . Heart attack Father   . Heart attack Sister   . Cancer Brother   . Diabetes Daughter   . Diabetes Son   . Hypertension Maternal Aunt   . Breast cancer Neg Hx     Social History   Socioeconomic History  . Marital status: Divorced    Spouse name: Not on file  . Number of children: 4  . Years of education: Not on file  . Highest education level: High school graduate  Occupational History  . Occupation: Unemployed  Social  Needs  . Financial resource strain: Hard  . Food insecurity    Worry: Often true    Inability: Often true  . Transportation needs    Medical: Yes    Non-medical: Yes  Tobacco Use  . Smoking status: Current Every Day Smoker    Packs/day: 0.33    Years: 49.00    Pack years: 16.17    Types: Cigarettes  . Smokeless tobacco: Never Used  Substance and Sexual Activity  . Alcohol use: No  . Drug use: No  . Sexual activity: Not Currently  Lifestyle  . Physical activity    Days per week: 0 days    Minutes per session: 0 min  . Stress: Rather much  Relationships  . Social Musicianconnections    Talks on phone: Three times a week    Gets together: Three times a week    Attends religious service: 1 to 4 times per year    Active member of club or organization: Yes    Attends meetings of clubs or organizations: 1 to 4 times per year    Relationship status: Divorced  . Intimate partner violence    Fear of current or ex partner: No    Emotionally abused:  No    Physically abused: No    Forced sexual activity: No  Other Topics Concern  . Not on file  Social History Narrative  . Not on file    Outpatient Medications Prior to Visit  Medication Sig Dispense Refill  . allopurinol (ZYLOPRIM) 100 MG tablet Take 1 tablet (100 mg total) by mouth daily. 90 tablet 3  . atorvastatin (LIPITOR) 10 MG tablet Take 1 tablet (10 mg total) by mouth daily. 30 tablet 0  . Fluticasone-Salmeterol (ADVAIR) 250-50 MCG/DOSE AEPB Inhale 1 puff into the lungs 2 (two) times daily. 60 each 4  . glipiZIDE (GLUCOTROL) 5 MG tablet Take 1 tablet (5 mg total) by mouth 2 (two) times daily before a meal. 60 tablet 2  . levothyroxine (SYNTHROID) 100 MCG tablet TAKE ONE TABLET BY MOUTH EVERY DAY BEFORE BREAKFAST. 90 tablet 0  . traZODone (DESYREL) 50 MG tablet Take 1 tablet (50 mg total) by mouth at bedtime. 30 tablet 0  . albuterol (VENTOLIN HFA) 108 (90 Base) MCG/ACT inhaler Inhale 2 puffs into the lungs every 6 (six) hours as  needed for wheezing or shortness of breath. 18 g 3  . metFORMIN (GLUCOPHAGE) 500 MG tablet Take 1 tablet (500 mg total) by mouth 2 (two) times daily with a meal. (Patient not taking: Reported on 08/19/2018) 180 tablet 3  . ibuprofen (ADVIL) 600 MG tablet Take 1 tablet (600 mg total) by mouth every 8 (eight) hours as needed. (Patient not taking: Reported on 07/16/2019) 30 tablet 3   No facility-administered medications prior to visit.     Allergies  Allergen Reactions  . Celery Oil     itching  . Demerol [Meperidine]     Burning, feels hot  . Morphine And Related Other (See Comments)    Muscle Spasms  . Penicillins     blisters    ROS Review of Systems  Respiratory: Positive for cough and shortness of breath.   Musculoskeletal: Positive for arthralgias (left shoulder pain).      Objective:    Physical Exam  Constitutional: She is oriented to person, place, and time. She appears well-developed and well-nourished. No distress.  HENT:  Head: Normocephalic and atraumatic.  Eyes: Pupils are equal, round, and reactive to light. Conjunctivae and EOM are normal.  Neck: Normal range of motion.  Cardiovascular: Normal rate, regular rhythm and normal heart sounds.  Pulmonary/Chest: Effort normal and breath sounds normal. No respiratory distress.  Musculoskeletal: Normal range of motion.        General: Tenderness (left shoulder. Full ROM. no deformities ) present.  Neurological: She is alert and oriented to person, place, and time.  Skin: Skin is warm and dry.  Psychiatric: She has a normal mood and affect. Her behavior is normal. Judgment and thought content normal.  Nursing note and vitals reviewed.   BP 131/71   Pulse 85   Temp 97.7 F (36.5 C)   Ht 5\' 3"  (1.6 m) Comment: self-reported  Wt 197 lb 12.8 oz (89.7 kg)   SpO2 93%   BMI 35.04 kg/m  Wt Readings from Last 3 Encounters:  07/16/19 197 lb 12.8 oz (89.7 kg)  06/25/19 194 lb 6.4 oz (88.2 kg)  12/15/18 183 lb (83 kg)      Health Maintenance Due  Topic Date Due  . Hepatitis C Screening  [birth year]  . PNEUMOCOCCAL POLYSACCHARIDE VACCINE AGE 30-64 HIGH RISK  09/22/1960  . FOOT EXAM  09/22/1968  . OPHTHALMOLOGY EXAM  09/22/1968  . HIV  Screening  09/22/1973  . TETANUS/TDAP  09/22/1977  . PAP SMEAR-Modifier  09/23/1979  . COLONOSCOPY  09/22/2008  . INFLUENZA VACCINE  05/09/2019    There are no preventive care reminders to display for this patient.  Lab Results  Component Value Date   TSH 3.840 02/26/2019   Lab Results  Component Value Date   WBC 7.7 02/26/2019   HGB 13.9 02/26/2019   HCT 40.5 02/26/2019   MCV 89 02/26/2019   PLT 297 02/26/2019   Lab Results  Component Value Date   NA 141 02/26/2019   K 4.1 02/26/2019   CO2 25 02/26/2019   GLUCOSE 120 (H) 02/26/2019   BUN 17 02/26/2019   CREATININE 0.80 02/26/2019   BILITOT <0.2 02/26/2019   ALKPHOS 73 02/26/2019   AST 19 02/26/2019   ALT 18 02/26/2019   PROT 6.9 02/26/2019   ALBUMIN 4.3 02/26/2019   CALCIUM 9.3 02/26/2019   ANIONGAP 12 04/18/2013   Lab Results  Component Value Date   CHOL 298 (H) 06/18/2019   Lab Results  Component Value Date   HDL 48 06/18/2019   Lab Results  Component Value Date   LDLCALC 187 (H) 06/18/2019   Lab Results  Component Value Date   TRIG 322 (H) 06/18/2019   Lab Results  Component Value Date   CHOLHDL 6.2 (H) 06/18/2019   Lab Results  Component Value Date   HGBA1C 6.5 (H) 06/18/2019      Assessment & Plan:   Problem List Items Addressed This Visit      Respiratory   COPD (chronic obstructive pulmonary disease) (HCC) - Primary   Relevant Medications   varenicline (CHANTIX STARTING MONTH PAK) 0.5 MG X 11 & 1 MG X 42 tablet   albuterol (VENTOLIN HFA) 108 (90 Base) MCG/ACT inhaler   Other Relevant Orders   DG Chest 2 View    Other Visit Diagnoses    Tobacco use disorder       Relevant Medications   varenicline (CHANTIX STARTING MONTH PAK) 0.5 MG X 11 & 1 MG X 42 tablet    Acute pain of left shoulder       Relevant Medications   meloxicam (MOBIC) 7.5 MG tablet   Need for influenza vaccination       Relevant Orders   Flu Vaccine QUAD 6+ mos PF IM (Fluarix Quad PF) (Completed)      Meds ordered this encounter  Medications  . varenicline (CHANTIX STARTING MONTH PAK) 0.5 MG X 11 & 1 MG X 42 tablet    Sig: Take one 0.5 mg tablet by mouth once daily for 3 days, then increase to one 0.5 mg tablet twice daily for 4 days, then increase to one 1 mg tablet twice daily.    Dispense:  53 tablet    Refill:  0  . albuterol (VENTOLIN HFA) 108 (90 Base) MCG/ACT inhaler    Sig: Inhale 2 puffs into the lungs every 6 (six) hours as needed for wheezing or shortness of breath.    Dispense:  18 g    Refill:  3  . meloxicam (MOBIC) 7.5 MG tablet    Sig: Take 1 tablet (7.5 mg total) by mouth daily for 15 days.    Dispense:  15 tablet    Refill:  0  Smoking cessation instruction/counseling given:  counseled patient on the dangers of tobacco use, advised patient to stop smoking, and reviewed strategies to maximize success  Discussed side effects, risks and benefits of taking  chantix.    Follow-up: Return in about 4 weeks (around 08/13/2019) for Smoking cessation.    Lanae Boast, FNP

## 2019-07-30 ENCOUNTER — Telehealth: Payer: Self-pay | Admitting: Pharmacist

## 2019-07-30 NOTE — Telephone Encounter (Signed)
07/30/2019 9:58:13 AM - Advair 250/50 New med to [facility]  07/30/2019 I have received the signed script back from provider for Advair 250/50 Inhale 1 puff into the lungs 2 times a day, rinse mouth & spit after each use. I have faxed to [facility] to process with Patient ID# [id number].Amy Knapp

## 2019-08-06 ENCOUNTER — Other Ambulatory Visit: Payer: Self-pay

## 2019-08-06 ENCOUNTER — Ambulatory Visit: Payer: Self-pay | Admitting: Licensed Clinical Social Worker

## 2019-08-06 DIAGNOSIS — F4381 Prolonged grief disorder: Secondary | ICD-10-CM

## 2019-08-06 DIAGNOSIS — F4323 Adjustment disorder with mixed anxiety and depressed mood: Secondary | ICD-10-CM

## 2019-08-06 DIAGNOSIS — F4321 Adjustment disorder with depressed mood: Secondary | ICD-10-CM

## 2019-08-06 NOTE — BH Specialist Note (Signed)
Integrated Behavioral Health Follow Up Visit Via Phone  MRN: [id number] Name: Amy Knapp  Number of [facility] Clinician visits: 1/6  Type of Service: [place] Interpretor:No. Interpretor Name and Language: not applicable.   SUBJECTIVE: Amy Knapp is a 63 y.o. female accompanied by herself. Patient was referred by Zara Council PA-C for mental health.  Patient reports the following symptoms/concerns: She reports that she is no longer having issues with sleep since starting the Trazodone. She reports that she is less depressed and anxious. She explains that her main goal is to find a job close to where she lives so she does not have to drive far. She explains that she is receiving unemployment for the next three months and that's the time frame for her to get a job. She denies suicidal and homicidal thoughts.  Duration of problem: ; Severity of problem: mild  OBJECTIVE: Mood: Euthymic and Affect: Appropriate Risk of harm to self or others: No plan to harm self or others  LIFE CONTEXT: Family and Social: see above. School/Work: see above. Self-Care: see above. Life Changes: see above.  GOALS ADDRESSED: Patient will: 1.  Reduce symptoms of: stress  2.  Increase knowledge and/or ability of: stress reduction  3.  Demonstrate ability to: Increase healthy adjustment to current life circumstances  INTERVENTIONS: Interventions utilized:  Medication Monitoring and brief CBT was utilized by the clinician. Clinician processed with the patient regarding if she has noticed any difference in her symptoms of depression, insomnia, and anxiety since starting the Trazodone 50 mg at bedtime. Clinician discussed with the patient regarding if she has continued to have any issues with sleep. Clinician asked the patient if she has noticed any side effects. Clinician asked the patient if she has attempted to reach out to a  job agency with assistance with finding a job.  Standardized Assessments completed: GAD-7 and PHQ 9  ASSESSMENT: Patient currently experiencing see above.   Patient may benefit from see above.  PLAN: 1. Follow up with behavioral health clinician on : one month or earlier if needed.  2. Behavioral recommendations: see above.  3. Referral(s): [facility]) 4. "From scale of 1-10, how likely are you to follow plan?":   Bayard Hugger, LCSW

## 2019-08-11 ENCOUNTER — Other Ambulatory Visit: Payer: Self-pay

## 2019-08-11 MED ORDER — TRAZODONE HCL 50 MG PO TABS: 50.0000 mg | ORAL_TABLET | Freq: Every day | ORAL | 2 refills | Status: DC

## 2019-08-19 ENCOUNTER — Other Ambulatory Visit: Payer: Self-pay

## 2019-08-19 DIAGNOSIS — R05 Cough: Secondary | ICD-10-CM

## 2019-08-19 DIAGNOSIS — R059 Cough, unspecified: Secondary | ICD-10-CM

## 2019-08-19 NOTE — Progress Notes (Signed)
Patient called today. She has not had chest xray done that was ordered on 07/16/2019. Wanted someone to schedule this for her. I gave her the address for [facility] on [address] and explained she does not need an appt that it is walk in.

## 2019-08-20 ENCOUNTER — Ambulatory Visit
Admission: RE | Admit: 2019-08-20 | Discharge: 2019-08-20 | Disposition: A | Discharge status: Home / Self Care | Payer: Self-pay | Source: Ambulatory Visit | Attending: Family Medicine | Admitting: Family Medicine

## 2019-08-20 ENCOUNTER — Other Ambulatory Visit: Payer: Self-pay

## 2019-08-20 DIAGNOSIS — R059 Cough, unspecified: Secondary | ICD-10-CM

## 2019-08-20 DIAGNOSIS — R05 Cough: Secondary | ICD-10-CM

## 2019-08-20 DIAGNOSIS — I700 Atherosclerosis of aorta: Secondary | ICD-10-CM | POA: Insufficient documentation

## 2019-08-27 ENCOUNTER — Ambulatory Visit: Payer: Self-pay | Admitting: Licensed Clinical Social Worker

## 2019-08-27 ENCOUNTER — Ambulatory Visit: Payer: Self-pay | Admitting: Family Medicine

## 2019-08-27 ENCOUNTER — Other Ambulatory Visit: Payer: Self-pay

## 2019-08-27 VITALS — BP 125/81 | HR 85 | Temp 97.7°F | Ht 63.0 in | Wt 200.0 lb

## 2019-08-27 DIAGNOSIS — F17200 Nicotine dependence, unspecified, uncomplicated: Secondary | ICD-10-CM

## 2019-08-27 DIAGNOSIS — J42 Unspecified chronic bronchitis: Secondary | ICD-10-CM

## 2019-08-27 NOTE — Progress Notes (Signed)
Acute Office Visit  Subjective:    Patient ID: Amy Knapp, female    DOB: [birth year], 63 y.o.   MRN: [id number]  Chief Complaint  Patient presents with  . Follow-up    HPI Patient is in today for follow up on chest xray. Patient reports having chest x ray 08/20/2019 due to having continuous cough. She reports a history of COPD and tobacco use. She states that she has had blood tinged sputum in the past, but denies recent occurrence.   Past Medical History:  Diagnosis Date  . Allergy   . Anxiety   . Arthritis   . Asthma   . Broken leg 2007   Right  . Cataract   . COPD (chronic obstructive pulmonary disease) ([facility])   . Depression   . Diabetes mellitus without complication ([facility])   . Emphysema of lung ([facility])   . GERD (gastroesophageal reflux disease)   . Gout   . Hyperlipidemia   . Sleep apnea   . Thyroid disease     Past Surgical History:  Procedure Laterality Date  . ABDOMINAL HYSTERECTOMY  2000   Partial  . LEG SURGERY Right 10/08/2005   broken right leg  . TUBAL LIGATION      Family History  Problem Relation Age of Onset  . Cancer Mother   . Heart attack Father   . Heart attack Sister   . Cancer Brother   . Diabetes Daughter   . Diabetes Son   . Hypertension Maternal Aunt   . Breast cancer Neg Hx     Social History   Socioeconomic History  . Marital status: Divorced    Spouse name: Not on file  . Number of children: 4  . Years of education: Not on file  . Highest education level: High school graduate  Occupational History  . Occupation: Unemployed  Social Needs  . Financial resource strain: Hard  . Food insecurity    Worry: Often true    Inability: Often true  . Transportation needs    Medical: Yes    Non-medical: Yes  Tobacco Use  . Smoking status: Current Every Day Smoker    Packs/day: 0.33    Years: 49.00    Pack years: 16.17    Types: Cigarettes  . Smokeless tobacco: Never Used  Substance and Sexual Activity  . Alcohol  use: No  . Drug use: No  . Sexual activity: Not Currently  Lifestyle  . Physical activity    Days per week: 0 days    Minutes per session: 0 min  . Stress: Rather much  Relationships  . Social Herbalist on phone: Three times a week    Gets together: Three times a week    Attends religious service: 1 to 4 times per year    Active member of club or organization: Yes    Attends meetings of clubs or organizations: 1 to 4 times per year    Relationship status: Divorced  . Intimate partner violence    Fear of current or ex partner: No    Emotionally abused: No    Physically abused: No    Forced sexual activity: No  Other Topics Concern  . Not on file  Social History Narrative  . Not on file    Outpatient Medications Prior to Visit  Medication Sig Dispense Refill  . albuterol (VENTOLIN HFA) 108 (90 Base) MCG/ACT inhaler Inhale 2 puffs into the lungs every 6 (six) hours as needed  for wheezing or shortness of breath. 18 g 3  . allopurinol (ZYLOPRIM) 100 MG tablet Take 1 tablet (100 mg total) by mouth daily. 90 tablet 3  . Fluticasone-Salmeterol (ADVAIR) 250-50 MCG/DOSE AEPB Inhale 1 puff into the lungs 2 (two) times daily. 60 each 4  . glipiZIDE (GLUCOTROL) 5 MG tablet Take 1 tablet (5 mg total) by mouth 2 (two) times daily before a meal. 60 tablet 2  . levothyroxine (SYNTHROID) 100 MCG tablet TAKE ONE TABLET BY MOUTH EVERY DAY BEFORE BREAKFAST. 90 tablet 0  . traZODone (DESYREL) 50 MG tablet Take 1 tablet (50 mg total) by mouth at bedtime. 30 tablet 2  . atorvastatin (LIPITOR) 10 MG tablet Take 1 tablet (10 mg total) by mouth daily. (Patient not taking: Reported on 08/27/2019) 30 tablet 0  . metFORMIN (GLUCOPHAGE) 500 MG tablet Take 1 tablet (500 mg total) by mouth 2 (two) times daily with a meal. (Patient not taking: Reported on 08/27/2019) 180 tablet 3  . varenicline (CHANTIX STARTING MONTH PAK) 0.5 MG X 11 & 1 MG X 42 tablet Take one 0.5 mg tablet by mouth once daily for 3  days, then increase to one 0.5 mg tablet twice daily for 4 days, then increase to one 1 mg tablet twice daily. (Patient not taking: Reported on 08/27/2019) 53 tablet 0   No facility-administered medications prior to visit.     Allergies  Allergen Reactions  . Celery Oil     itching  . Demerol [Meperidine]     Burning, feels hot  . Morphine And Related Other (See Comments)    Muscle Spasms  . Penicillins     blisters    Review of Systems  Respiratory: Positive for cough, sputum production, shortness of breath and wheezing.   All other systems reviewed and are negative.      Objective:    Physical Exam  Constitutional: She is oriented to person, place, and time. She appears well-developed and well-nourished. No distress.  HENT:  Head: Normocephalic and atraumatic.  Eyes: Pupils are equal, round, and reactive to light. Conjunctivae and EOM are normal.  Neck: Normal range of motion.  Cardiovascular: Normal rate, regular rhythm and normal heart sounds.  Pulmonary/Chest: Effort normal and breath sounds normal. No respiratory distress.  Musculoskeletal: Normal range of motion.  Neurological: She is alert and oriented to person, place, and time.  Skin: Skin is warm and dry.  Psychiatric: She has a normal mood and affect. Her behavior is normal. Judgment and thought content normal.  Nursing note and vitals reviewed.   BP 125/81   Pulse 85   Temp 97.7 F (36.5 C)   Ht 5\' 3"  (1.6 m)   Wt 200 lb (90.7 kg)   SpO2 92%   BMI 35.43 kg/m  Wt Readings from Last 3 Encounters:  08/27/19 200 lb (90.7 kg)  07/16/19 197 lb 12.8 oz (89.7 kg)  06/25/19 194 lb 6.4 oz (88.2 kg)    Health Maintenance Due  Topic Date Due  . Hepatitis C Screening  28-Jun-1956  . PNEUMOCOCCAL POLYSACCHARIDE VACCINE AGE 79-64 HIGH RISK  09/22/1960  . FOOT EXAM  09/22/1968  . OPHTHALMOLOGY EXAM  09/22/1968  . HIV Screening  09/22/1973  . TETANUS/TDAP  09/22/1977  . PAP SMEAR-Modifier  09/23/1979  .  COLONOSCOPY  09/22/2008    There are no preventive care reminders to display for this patient.   Lab Results  Component Value Date   TSH 3.840 02/26/2019   Lab Results  Component Value Date   WBC 7.7 02/26/2019   HGB 13.9 02/26/2019   HCT 40.5 02/26/2019   MCV 89 02/26/2019   PLT 297 02/26/2019    Lab Results  Component Value Date   CHOL 298 (H) 06/18/2019   Lab Results  Component Value Date   HDL 48 06/18/2019   Lab Results  Component Value Date   LDLCALC 187 (H) 06/18/2019   Lab Results  Component Value Date   TRIG 322 (H) 06/18/2019   Lab Results  Component Value Date   CHOLHDL 6.2 (H) 06/18/2019   Lab Results  Component Value Date   HGBA1C 6.5 (H) 06/18/2019       Assessment & Plan:   Problem List Items Addressed This Visit      Respiratory   COPD (chronic obstructive pulmonary disease) (HCC) - Primary    Other Visit Diagnoses    Tobacco use disorder         Patient advised on smoking cessation. Has not received chantix from medication management. Patient advised of her xray results and encouraged to stop smoking, exercise and eat a heart healthy diet.   No medication changes warranted at the present time.    Mike GipAndre Fillmore Bynum, FNP

## 2019-08-27 NOTE — Patient Instructions (Signed)
Health Maintenance, Female Adopting a healthy lifestyle and getting preventive care are important in promoting health and wellness. Ask your health care provider about:  The right schedule for you to have regular tests and exams.  Things you can do on your own to prevent diseases and keep yourself healthy. What should I know about diet, weight, and exercise? Eat a healthy diet   Eat a diet that includes plenty of vegetables, fruits, low-fat dairy products, and lean protein.  Do not eat a lot of foods that are high in solid fats, added sugars, or sodium. Maintain a healthy weight Body mass index (BMI) is used to identify weight problems. It estimates body fat based on height and weight. Your health care provider can help determine your BMI and help you achieve or maintain a healthy weight. Get regular exercise Get regular exercise. This is one of the most important things you can do for your health. Most adults should:  Exercise for at least 150 minutes each week. The exercise should increase your heart rate and make you sweat (moderate-intensity exercise).  Do strengthening exercises at least twice a week. This is in addition to the moderate-intensity exercise.  Spend less time sitting. Even light physical activity can be beneficial. Watch cholesterol and blood lipids Have your blood tested for lipids and cholesterol at 61 years of age, then have this test every 5 years. Have your cholesterol levels checked more often if:  Your lipid or cholesterol levels are high.  You are older than 61 years of age.  You are at high risk for heart disease. What should I know about cancer screening? Depending on your health history and family history, you may need to have cancer screening at various ages. This may include screening for:  Breast cancer.  Cervical cancer.  Colorectal cancer.  Skin cancer.  Lung cancer. What should I know about heart disease, diabetes, and high blood  pressure? Blood pressure and heart disease  High blood pressure causes heart disease and increases the risk of stroke. This is more likely to develop in people who have high blood pressure readings, are of African descent, or are overweight.  Have your blood pressure checked: ? Every 3-5 years if you are 68-56 years of age. ? Every year if you are 44 years old or older. Diabetes Have regular diabetes screenings. This checks your fasting blood sugar level. Have the screening done:  Once every three years after age 35 if you are at a normal weight and have a low risk for diabetes.  More often and at a younger age if you are overweight or have a high risk for diabetes. What should I know about preventing infection? Hepatitis B If you have a higher risk for hepatitis B, you should be screened for this virus. Talk with your health care provider to find out if you are at risk for hepatitis B infection. Hepatitis C Testing is recommended for:  Everyone born from [birth year] through 1963.  Anyone with known risk factors for hepatitis C. Sexually transmitted infections (STIs)  Get screened for STIs, including gonorrhea and chlamydia, if: ? You are sexually active and are younger than 61 years of age. ? You are older than 61 years of age and your health care provider tells you that you are at risk for this type of infection. ? Your sexual activity has changed since you were last screened, and you are at increased risk for chlamydia or gonorrhea. Ask your health care provider if  you are at risk.  Ask your health care provider about whether you are at high risk for HIV. Your health care provider may recommend a prescription medicine to help prevent HIV infection. If you choose to take medicine to prevent HIV, you should first get tested for HIV. You should then be tested every 3 months for as long as you are taking the medicine. Pregnancy  If you are about to stop having your period (premenopausal) and  you may become pregnant, seek counseling before you get pregnant.  Take 400 to 800 micrograms (mcg) of folic acid every day if you become pregnant.  Ask for birth control (contraception) if you want to prevent pregnancy. Osteoporosis and menopause Osteoporosis is a disease in which the bones lose minerals and strength with aging. This can result in bone fractures. If you are 61 years old or older, or if you are at risk for osteoporosis and fractures, ask your health care provider if you should:  Be screened for bone loss.  Take a calcium or vitamin D supplement to lower your risk of fractures.  Be given hormone replacement therapy (HRT) to treat symptoms of menopause. Follow these instructions at home: Lifestyle  Do not use any products that contain nicotine or tobacco, such as cigarettes, e-cigarettes, and chewing tobacco. If you need help quitting, ask your health care provider.  Do not use street drugs.  Do not share needles.  Ask your health care provider for help if you need support or information about quitting drugs. Alcohol use  Do not drink alcohol if: ? Your health care provider tells you not to drink. ? You are pregnant, may be pregnant, or are planning to become pregnant.  If you drink alcohol: ? Limit how much you use to 0-1 drink a day. ? Limit intake if you are breastfeeding.  Be aware of how much alcohol is in your drink. In the [place] one drink equals one 12 oz bottle of beer (355 mL), one 5 oz glass of wine (148 mL), or one 1 oz glass of hard liquor (44 mL). General instructions  Schedule regular health, dental, and eye exams.  Stay current with your vaccines.  Tell your health care provider if: ? You often feel depressed. ? You have ever been abused or do not feel safe at home. Summary  Adopting a healthy lifestyle and getting preventive care are important in promoting health and wellness.  Follow your health care provider's instructions about healthy  diet, exercising, and getting tested or screened for diseases.  Follow your health care provider's instructions on monitoring your cholesterol and blood pressure. This information is not intended to replace advice given to you by your health care provider. Make sure you discuss any questions you have with your health care provider. Document Released: 04/09/2011 Document Revised: 09/17/2018 Document Reviewed: 09/17/2018 Elsevier Patient Education  2020 [employer] Coping with Quitting Smoking  Quitting smoking is a physical and mental challenge. You will face cravings, withdrawal symptoms, and temptation. Before quitting, work with your health care provider to make a plan that can help you cope. Preparation can help you quit and keep you from giving in. How can I cope with cravings? Cravings usually last for 5-10 minutes. If you get through it, the craving will pass. Consider taking the following actions to help you cope with cravings:  Keep your mouth busy: ? Chew sugar-free gum. ? Suck on hard candies or a straw. ? Brush your teeth.  Keep your hands  and body busy: ? Immediately change to a different activity when you feel a craving. ? Squeeze or play with a ball. ? Do an activity or a hobby, like making bead jewelry, practicing needlepoint, or working with wood. ? Mix up your normal routine. ? Take a short exercise break. Go for a quick walk or run up and down stairs. ? Spend time in public places where smoking is not allowed.  Focus on doing something kind or helpful for someone else.  Call a friend or family member to talk during a craving.  Join a support group.  Call a quit line, such as 1-800-QUIT-NOW.  Talk with your health care provider about medicines that might help you cope with cravings and make quitting easier for you. How can I deal with withdrawal symptoms? Your body may experience negative effects as it tries to get used to not having nicotine in the system.  These effects are called withdrawal symptoms. They may include:  Feeling hungrier than normal.  Trouble concentrating.  Irritability.  Trouble sleeping.  Feeling depressed.  Restlessness and agitation.  Craving a cigarette. To manage withdrawal symptoms:  Avoid places, people, and activities that trigger your cravings.  Remember why you want to quit.  Get plenty of sleep.  Avoid coffee and other caffeinated drinks. These may worsen some of your symptoms. How can I handle social situations? Social situations can be difficult when you are quitting smoking, especially in the first few weeks. To manage this, you can:  Avoid parties, bars, and other social situations where people might be smoking.  Avoid alcohol.  Leave right away if you have the urge to smoke.  Explain to your family and friends that you are quitting smoking. Ask for understanding and support.  Plan activities with friends or family where smoking is not an option. What are some ways I can cope with stress? Wanting to smoke may cause stress, and stress can make you want to smoke. Find ways to manage your stress. Relaxation techniques can help. For example:  Breathe slowly and deeply, in through your nose and out through your mouth.  Listen to soothing, relaxing music.  Talk with a family member or friend about your stress.  Light a candle.  Soak in a bath or take a shower.  Think about a peaceful place. What are some ways I can prevent weight gain? Be aware that many people gain weight after they quit smoking. However, not everyone does. To keep from gaining weight, have a plan in place before you quit and stick to the plan after you quit. Your plan should include:  Having healthy snacks. When you have a craving, it may help to: ? Eat plain popcorn, crunchy carrots, celery, or other cut vegetables. ? Chew sugar-free gum.  Changing how you eat: ? Eat small portion sizes at meals. ? Eat 4-6 small  meals throughout the day instead of 1-2 large meals a day. ? Be mindful when you eat. Do not watch television or do other things that might distract you as you eat.  Exercising regularly: ? Make time to exercise each day. If you do not have time for a long workout, do short bouts of exercise for 5-10 minutes several times a day. ? Do some form of strengthening exercise, like weight lifting, and some form of aerobic exercise, like running or swimming.  Drinking plenty of water or other low-calorie or no-calorie drinks. Drink 6-8 glasses of water daily, or as much as instructed  by your health care provider. Summary  Quitting smoking is a physical and mental challenge. You will face cravings, withdrawal symptoms, and temptation to smoke again. Preparation can help you as you go through these challenges.  You can cope with cravings by keeping your mouth busy (such as by chewing gum), keeping your body and hands busy, and making calls to family, friends, or a helpline for people who want to quit smoking.  You can cope with withdrawal symptoms by avoiding places where people smoke, avoiding drinks with caffeine, and getting plenty of rest.  Ask your health care provider about the different ways to prevent weight gain, avoid stress, and handle social situations. This information is not intended to replace advice given to you by your health care provider. Make sure you discuss any questions you have with your health care provider. Document Released: 09/21/2016 Document Revised: 09/06/2017 Document Reviewed: 09/21/2016 Elsevier Patient Education  2020 [employer].

## 2019-08-28 ENCOUNTER — Telehealth: Payer: Self-pay | Admitting: Pharmacist

## 2019-08-28 ENCOUNTER — Other Ambulatory Visit: Payer: Self-pay

## 2019-08-28 MED ORDER — VARENICLINE TARTRATE 1 MG PO TABS: 1.0000 mg | ORAL_TABLET | Freq: Two times a day (BID) | ORAL | 0 refills | Status: DC

## 2019-08-28 NOTE — Telephone Encounter (Signed)
08/28/2019 2:22:29 PM - Chantix Starter & Chantix 1mg   08/28/2019 Received pharmacy printouts from [facility]-Chantix Starter box-Take 0.5mg  once daily for 3 days, then 0.5mg  take 2 times daily for 4 days, then take 1mg  two times a day on day 8, for 11 weeks #53 and also Chantix 1mg  continuing Take 1 tablet by mouth 2 times a day for 11 weeks. Pharmacist, community patient her portion to sign & return, also will send to [facility] for Shannon to sign.Amy Knapp

## 2019-08-31 ENCOUNTER — Other Ambulatory Visit: Payer: Self-pay

## 2019-08-31 DIAGNOSIS — E785 Hyperlipidemia, unspecified: Secondary | ICD-10-CM

## 2019-08-31 MED ORDER — ATORVASTATIN CALCIUM 10 MG PO TABS: 10.0000 mg | ORAL_TABLET | Freq: Every day | ORAL | 0 refills | Status: DC

## 2019-09-21 ENCOUNTER — Telehealth: Payer: Self-pay | Admitting: Pharmacist

## 2019-09-21 NOTE — Telephone Encounter (Signed)
09/21/2019 2:20:57 PM - Chantix Starter & Chantix 1mg  faxed to [employer]  09/21/2019 Faxed Pfizer application for enrollment: Chantix Starter month Take 0.5mg  once daily for 3 days, then take 0.5mg  2 times dailyfor 4 days, then take 1mg  two times a day on day 8 for 11 weeks, then Chantix 1mg  Take 1 tablet by mouth 2 times a day for 11 weeks.Delos Haring

## 2019-09-28 ENCOUNTER — Other Ambulatory Visit: Payer: Self-pay | Admitting: Urology

## 2019-10-08 ENCOUNTER — Other Ambulatory Visit: Payer: Self-pay

## 2019-10-08 DIAGNOSIS — E785 Hyperlipidemia, unspecified: Secondary | ICD-10-CM

## 2019-10-08 MED ORDER — ATORVASTATIN CALCIUM 10 MG PO TABS: 10.0000 mg | ORAL_TABLET | Freq: Every day | ORAL | 0 refills | Status: DC

## 2019-10-21 ENCOUNTER — Other Ambulatory Visit: Payer: Self-pay

## 2019-10-21 MED ORDER — LEVOTHYROXINE SODIUM 100 MCG PO TABS: ORAL_TABLET | ORAL | 0 refills | Status: DC

## 2019-10-29 ENCOUNTER — Ambulatory Visit: Payer: Self-pay | Admitting: Urology

## 2019-10-29 ENCOUNTER — Other Ambulatory Visit: Payer: Self-pay

## 2019-10-29 VITALS — BP 133/84 | HR 95 | Temp 97.1°F | Ht 63.0 in | Wt 199.5 lb

## 2019-10-29 DIAGNOSIS — E039 Hypothyroidism, unspecified: Secondary | ICD-10-CM

## 2019-10-29 MED ORDER — AMOXICILLIN-POT CLAVULANATE 875-125 MG PO TABS: 1.0000 | ORAL_TABLET | Freq: Two times a day (BID) | ORAL | 0 refills | Status: DC

## 2019-10-29 MED ORDER — TRAZODONE HCL 50 MG PO TABS: 50.0000 mg | ORAL_TABLET | Freq: Every day | ORAL | 2 refills | Status: DC

## 2019-10-29 NOTE — Patient Instructions (Signed)

## 2019-10-29 NOTE — Progress Notes (Signed)
  Patient: Amy Knapp Female    DOB: June 19, 1956   64 y.o.   MRN: [id number] Visit Date: 10/29/2019  Today's Provider: [facility]   No chief complaint on file.  Subjective:    HPI Down  to three cigarettes a day   Left ear is hurting for two weeks ago.  Throbbing pain.  No drainage from ears or nose.    Allergy to PCN in chart but states can take Augmentin   Doesn't want COVID vaccine due to she believes it has metal in it     Allergies  Allergen Reactions  . Celery Oil     itching  . Demerol [Meperidine]     Burning, feels hot  . Morphine And Related Other (See Comments)    Muscle Spasms  . Penicillins     blisters   Previous Medications   ALBUTEROL (VENTOLIN HFA) 108 (90 BASE) MCG/ACT INHALER    Inhale 2 puffs into the lungs every 6 (six) hours as needed for wheezing or shortness of breath.   ALLOPURINOL (ZYLOPRIM) 100 MG TABLET    Take 1 tablet (100 mg total) by mouth daily.   ATORVASTATIN (LIPITOR) 10 MG TABLET    Take 1 tablet (10 mg total) by mouth daily.   FLUTICASONE-SALMETEROL (ADVAIR) 250-50 MCG/DOSE AEPB    Inhale 1 puff into the lungs 2 (two) times daily.   GLIPIZIDE (GLUCOTROL) 5 MG TABLET    Take 1 tablet (5 mg total) by mouth 2 (two) times daily before a meal.   LEVOTHYROXINE (SYNTHROID) 100 MCG TABLET    TAKE ONE TABLET BY MOUTH EVERY DAY BEFORE BREAKFAST.   METFORMIN (GLUCOPHAGE) 500 MG TABLET    Take 1 tablet (500 mg total) by mouth 2 (two) times daily with a meal.   TRAZODONE (DESYREL) 50 MG TABLET    Take 1 tablet (50 mg total) by mouth at bedtime.   VARENICLINE (CHANTIX STARTING MONTH PAK) 0.5 MG X 11 & 1 MG X 42 TABLET    Take one 0.5 mg tablet by mouth once daily for 3 days, then increase to one 0.5 mg tablet twice daily for 4 days, then increase to one 1 mg tablet twice daily.    Review of Systems  Social History   Tobacco Use  . Smoking status: Current Every Day Smoker    Packs/day: 0.33    Years: 49.00    Pack years:  16.17    Types: Cigarettes  . Smokeless tobacco: Never Used  Substance Use Topics  . Alcohol use: No   Objective:   BP 133/84   Pulse 95   Temp (!) 97.1 F (36.2 C)   Ht 5\' 3"  (1.6 m)   Wt 199 lb 8 oz (90.5 kg)   SpO2 93%   BMI 35.34 kg/m   Physical Exam Otoscope not available      Assessment & Plan:     1. Otitis media Augmentin BID x 7 days  2. Tobacco dependence Continue Chanitx       [facility]   [facility]

## 2019-11-04 ENCOUNTER — Other Ambulatory Visit: Payer: Self-pay

## 2019-11-04 DIAGNOSIS — E039 Hypothyroidism, unspecified: Secondary | ICD-10-CM

## 2019-11-05 LAB — LIPID PANEL
Chol/HDL Ratio: 4.3 ratio (ref 0.0–4.4)
Cholesterol, Total: 234 mg/dL — ABNORMAL HIGH (ref 100–199)
HDL: 55 mg/dL (ref >39)
LDL Chol Calc (NIH): 128 mg/dL — ABNORMAL HIGH (ref 0–99)
Triglycerides: 289 mg/dL — ABNORMAL HIGH (ref 0–149)
VLDL Cholesterol Cal: 51 mg/dL — ABNORMAL HIGH (ref 5–40)

## 2019-11-05 LAB — HEMOGLOBIN A1C
Est. average glucose Bld gHb Est-mCnc: 157 mg/dL
Hgb A1c MFr Bld: 7.1 % — ABNORMAL HIGH (ref 4.8–5.6)

## 2019-11-05 LAB — THYROID PANEL WITH TSH
Free Thyroxine Index: 1.3 (ref 1.2–4.9)
T3 Uptake Ratio: 22 % — ABNORMAL LOW (ref 24–39)
T4, Total: 5.8 ug/dL (ref 4.5–12.0)
TSH: 20.3 u[IU]/mL — ABNORMAL HIGH (ref 0.450–4.500)

## 2019-11-12 ENCOUNTER — Ambulatory Visit: Payer: Self-pay | Admitting: Urology

## 2019-11-12 ENCOUNTER — Other Ambulatory Visit: Payer: Self-pay

## 2019-11-12 VITALS — BP 107/72 | HR 80 | Ht 63.0 in | Wt 203.0 lb

## 2019-11-12 DIAGNOSIS — E039 Hypothyroidism, unspecified: Secondary | ICD-10-CM

## 2019-11-12 MED ORDER — TRAZODONE HCL 50 MG PO TABS: 50.0000 mg | ORAL_TABLET | Freq: Every day | ORAL | 2 refills | Status: DC

## 2019-11-12 MED ORDER — GLIPIZIDE 5 MG PO TABS: 5.0000 mg | ORAL_TABLET | Freq: Two times a day (BID) | ORAL | 2 refills | Status: DC

## 2019-11-12 MED ORDER — LEVOTHYROXINE SODIUM 100 MCG PO TABS: ORAL_TABLET | ORAL | 3 refills | Status: DC

## 2019-11-12 NOTE — Progress Notes (Signed)
  Patient: Amy Knapp Female    DOB: [birth year]   64 y.o.   MRN: [id number] Visit Date: 11/12/2019  Today's Provider: Michiel Cowboy, PA-C   Chief Complaint  Patient presents with  . Follow-up   Subjective:    HPI  DM  - BS 200's -220's   Hypothyroidism  - unsure if she was consistently taking the Synthroid medication when blood work was drawn   Allergies  Allergen Reactions  . Celery Oil     itching  . Demerol [Meperidine]     Burning, feels hot  . Morphine And Related Other (See Comments)    Muscle Spasms  . Penicillins     blisters   Previous Medications   ALBUTEROL (VENTOLIN HFA) 108 (90 BASE) MCG/ACT INHALER    Inhale 2 puffs into the lungs every 6 (six) hours as needed for wheezing or shortness of breath.   ALLOPURINOL (ZYLOPRIM) 100 MG TABLET    Take 1 tablet (100 mg total) by mouth daily.   AMOXICILLIN-CLAVULANATE (AUGMENTIN) 875-125 MG TABLET    Take 1 tablet by mouth every 12 (twelve) hours.   ATORVASTATIN (LIPITOR) 10 MG TABLET    Take 1 tablet (10 mg total) by mouth daily.   FLUTICASONE-SALMETEROL (ADVAIR) 250-50 MCG/DOSE AEPB    Inhale 1 puff into the lungs 2 (two) times daily.   GLIPIZIDE (GLUCOTROL) 5 MG TABLET    Take 1 tablet (5 mg total) by mouth 2 (two) times daily before a meal.   LEVOTHYROXINE (SYNTHROID) 100 MCG TABLET    TAKE ONE TABLET BY MOUTH EVERY DAY BEFORE BREAKFAST.   METFORMIN (GLUCOPHAGE) 500 MG TABLET    Take 1 tablet (500 mg total) by mouth 2 (two) times daily with a meal.   TRAZODONE (DESYREL) 50 MG TABLET    Take 1 tablet (50 mg total) by mouth at bedtime.   VARENICLINE (CHANTIX STARTING MONTH PAK) 0.5 MG X 11 & 1 MG X 42 TABLET    Take one 0.5 mg tablet by mouth once daily for 3 days, then increase to one 0.5 mg tablet twice daily for 4 days, then increase to one 1 mg tablet twice daily.    Review of Systems  Social History   Tobacco Use  . Smoking status: Current Every Day Smoker    Packs/day: 0.33    Years: 49.00   Pack years: 16.17    Types: Cigarettes  . Smokeless tobacco: Never Used  Substance Use Topics  . Alcohol use: No   Objective:   BP 107/72   Pulse 80   Ht 5\' 3"  (1.6 m)   Wt 203 lb (92.1 kg)   SpO2 94%   BMI 35.96 kg/m   Physical Exam  Constitutional:  Well nourished. Alert and oriented, No acute distress. HEENT: [place] AT, mask in place.  Trachea midline, no masses. Cardiovascular: No clubbing, cyanosis, or edema. Respiratory: Normal respiratory effort, no increased work of breathing. Neurologic: Grossly intact, no focal deficits, moving all 4 extremities. Psychiatric: Normal mood and affect.     Assessment & Plan:        1. Hypothyroidism, unspecified type - patient will take the synthroid consistently on an empty stomach, one hour before any food or other medications and recheck levels in one month   - Thyroid Panel With TSH; Future  2. DM - glucometer    , PA-C   [facility]

## 2019-12-10 ENCOUNTER — Other Ambulatory Visit: Payer: Self-pay

## 2019-12-10 ENCOUNTER — Ambulatory Visit: Payer: Self-pay | Admitting: Urology

## 2019-12-10 VITALS — HR 99 | Temp 98.9°F | Ht 62.0 in | Wt 201.5 lb

## 2019-12-10 DIAGNOSIS — J42 Unspecified chronic bronchitis: Secondary | ICD-10-CM

## 2019-12-10 DIAGNOSIS — F17200 Nicotine dependence, unspecified, uncomplicated: Secondary | ICD-10-CM

## 2019-12-10 DIAGNOSIS — M25512 Pain in left shoulder: Secondary | ICD-10-CM

## 2019-12-10 DIAGNOSIS — E039 Hypothyroidism, unspecified: Secondary | ICD-10-CM

## 2019-12-10 MED ORDER — FLUTICASONE-SALMETEROL 500-50 MCG/DOSE IN AEPB: 1.0000 | INHALATION_SPRAY | Freq: Two times a day (BID) | RESPIRATORY_TRACT | 3 refills | Status: DC

## 2019-12-10 MED ORDER — IBUPROFEN 400 MG PO TABS: 400.0000 mg | ORAL_TABLET | Freq: Three times a day (TID) | ORAL | 0 refills | Status: DC | PRN

## 2019-12-10 NOTE — Progress Notes (Signed)
Patient: Amy Knapp Female    DOB: March 22, 1956   64 y.o.   MRN: [id number] Visit Date: 12/10/2019  Today's Provider: [place]   Chief Complaint  Patient presents with  . Shoulder Pain    Alternating between better and worse, taking Tylenol for it  . Follow-up   Subjective:    HPI  Shoulder pain  - known injury of right shoulder in 2018  - no known injury of left shoulder  - now having a sharper pain in the left collarbone/shoulder area with instances of numbness of the middle fingers on her left hand   - Extra Strength Tylenol helps somewhat  Hypothyroidism  - will check TSH tonight  Diabetes  - 160 to 113 BS at home  COPD  - still having some wheezing  - using the rescue inhaler 4 times daily    Tobacco dependence  - taking Chantix  - down to 2 cigs a day    Allergies  Allergen Reactions  . Celery Oil     itching  . Demerol [Meperidine]     Burning, feels hot  . Morphine And Related Other (See Comments)    Muscle Spasms  . Penicillins     blisters   Previous Medications   ALBUTEROL (VENTOLIN HFA) 108 (90 BASE) MCG/ACT INHALER    Inhale 2 puffs into the lungs every 6 (six) hours as needed for wheezing or shortness of breath.   ALLOPURINOL (ZYLOPRIM) 100 MG TABLET    Take 1 tablet (100 mg total) by mouth daily.   AMOXICILLIN-CLAVULANATE (AUGMENTIN) 875-125 MG TABLET    Take 1 tablet by mouth every 12 (twelve) hours.   ATORVASTATIN (LIPITOR) 10 MG TABLET    Take 1 tablet (10 mg total) by mouth daily.   GLIPIZIDE (GLUCOTROL) 5 MG TABLET    Take 1 tablet (5 mg total) by mouth 2 (two) times daily before a meal.   LEVOTHYROXINE (SYNTHROID) 100 MCG TABLET    TAKE ONE TABLET BY MOUTH EVERY DAY BEFORE BREAKFAST.   METFORMIN (GLUCOPHAGE) 500 MG TABLET    Take 1 tablet (500 mg total) by mouth 2 (two) times daily with a meal.   TRAZODONE (DESYREL) 50 MG TABLET    Take 1 tablet (50 mg total) by mouth at bedtime.   VARENICLINE (CHANTIX STARTING  MONTH PAK) 0.5 MG X 11 & 1 MG X 42 TABLET    Take one 0.5 mg tablet by mouth once daily for 3 days, then increase to one 0.5 mg tablet twice daily for 4 days, then increase to one 1 mg tablet twice daily.    Review of Systems  Social History   Tobacco Use  . Smoking status: Current Every Day Smoker    Packs/day: 0.10    Years: 49.00    Pack years: 4.90    Types: Cigarettes  . Smokeless tobacco: Never Used  Substance Use Topics  . Alcohol use: No   Objective:   Pulse 99   Temp 98.9 F (37.2 C)   Ht 5\' 2"  (1.575 m)   Wt 201 lb 8 oz (91.4 kg)   SpO2 91%   BMI 36.85 kg/m   Physical Exam      Assessment & Plan:      1. Chronic bronchitis, unspecified chronic bronchitis type ([place]) - increase the Advair to 500/50  - follow up in one month for symptom recheck  2. Hypothyroidism, unspecified type - Thyroid Panel With TSH  3. Tobacco use disorder -  doing well, down to 2 cigarettes daily  4. Acute pain of left shoulder - appointment with Dr. Justice Rocher    [facility]   [facility]

## 2019-12-11 ENCOUNTER — Telehealth: Payer: Self-pay | Admitting: Pharmacist

## 2019-12-11 LAB — THYROID PANEL WITH TSH
Free Thyroxine Index: 2 (ref 1.2–4.9)
T3 Uptake Ratio: 25 % (ref 24–39)
T4, Total: 7.9 ug/dL (ref 4.5–12.0)
TSH: 4.04 u[IU]/mL (ref 0.450–4.500)

## 2019-12-11 NOTE — Telephone Encounter (Signed)
12/11/2019 2:23:21 PM - Advair 500/50 Dose Increase  -- Rhetta Mura - Friday, December 11, 2019 2:21 PM -- Received pharmacy printout for Dose Increase--Advair 500/50 Inhale 1 puff into the lungs every morning and at bedtime-rinse mouth and spit after each use. (*patient was previously on Advair 250/50*). Printed script and will send to [facility] for Shannon to sign.

## 2019-12-18 ENCOUNTER — Other Ambulatory Visit: Payer: Self-pay | Admitting: Gerontology

## 2019-12-30 ENCOUNTER — Telehealth: Payer: Self-pay | Admitting: Pharmacy Technician

## 2019-12-30 NOTE — Telephone Encounter (Signed)
Assisted patient with the completion of [facility]'s application for re-certification. Patient still needs to provide 2021 SS widow's benefit, 2021 bank statement and 2021 unemployment statement, and 2020 tax return.  Patient acknowledged that she understood that no additional medication assistance will be provided after 01/07/20 if all requested documentation is not provided with the exception of the tax return.  Since patient has till 02/22/20 to file taxes.  Patient will be given until 03/08/20 to provide 2020 Federal Tax Return.  Sherilyn Dacosta Care Manager [facility]

## 2020-01-05 ENCOUNTER — Ambulatory Visit: Payer: Self-pay | Admitting: Specialist

## 2020-01-05 ENCOUNTER — Other Ambulatory Visit: Payer: Self-pay

## 2020-01-05 DIAGNOSIS — M25512 Pain in left shoulder: Secondary | ICD-10-CM

## 2020-01-05 MED ORDER — MELOXICAM 7.5 MG PO TABS: 7.5000 mg | ORAL_TABLET | Freq: Every day | ORAL | 0 refills | Status: AC

## 2020-01-05 NOTE — Progress Notes (Signed)
° °  Subjective:    Patient ID: Amy Knapp, female    DOB: January 08, 1956, 64 y.o.   MRN: [id number]  HPI 64 year old with bilateral shoulder pain. There was Hx of a fall at work. X-rays showed no fractures. Shortly thereafter, she developed L shoulder pain which is now her major complaint. She is able to sleep on either side. She is able to scratch the middle of her back. She c/o of pain on the superior aspect of her shoulder without radiation.    Review of Systems     Objective:   Physical Exam R shoulder FROM with pain at the end range. L shoulder FROM without pain.  Neck: Essentially FROM except L lateral flexion causes pain on the superior aspect of the L shoulder. Spurling's test caused R paracervical pain.   DTRs 2+ symmetrical Tinel's and Durkan's test neg.  Phalen's and Reverse Phalen's neg.       Assessment & Plan:  Bilateral shoulder pain. I doubt there is a surgical problem. She's been on ibuprofen which has not helped. I will suggest a short term course of Mobic and will return PRN.

## 2020-01-11 ENCOUNTER — Other Ambulatory Visit: Payer: Self-pay | Admitting: Urology

## 2020-01-14 ENCOUNTER — Ambulatory Visit: Payer: Self-pay | Admitting: Family Medicine

## 2020-01-14 ENCOUNTER — Other Ambulatory Visit: Payer: Self-pay

## 2020-01-14 DIAGNOSIS — E039 Hypothyroidism, unspecified: Secondary | ICD-10-CM

## 2020-01-14 DIAGNOSIS — I10 Essential (primary) hypertension: Secondary | ICD-10-CM

## 2020-01-14 DIAGNOSIS — M109 Gout, unspecified: Secondary | ICD-10-CM

## 2020-01-14 DIAGNOSIS — E1140 Type 2 diabetes mellitus with diabetic neuropathy, unspecified: Secondary | ICD-10-CM

## 2020-01-14 MED ORDER — GABAPENTIN 300 MG PO CAPS: 300.0000 mg | ORAL_CAPSULE | Freq: Two times a day (BID) | ORAL | 1 refills | Status: DC

## 2020-01-14 NOTE — Progress Notes (Signed)
   Virtual Visit via Telephone Note  I connected with Amy Knapp on 01/14/20 at  5:30 PM EDT by telephone and verified that I am speaking with the correct person using two identifiers.   I discussed the limitations, risks, security and privacy concerns of performing an evaluation and management service by telephone and the availability of in person appointments. I also discussed with the patient that there may be a patient responsible charge related to this service. The patient expressed understanding and agreed to proceed.   History of Present Illness: Amy Knapp  has a past medical history of Allergy, Anxiety, Arthritis, Asthma, Broken leg (2007), Cataract, COPD (chronic obstructive pulmonary disease) (HCC), Depression, Diabetes mellitus without complication (HCC), Emphysema of lung (HCC), GERD (gastroesophageal reflux disease), Gout, Hyperlipidemia, Sleep apnea, and Thyroid disease. Patient states that she is having diabetic neuropathy and is in 8/10 pain today. She reports that she is no longer taking metformin and she continues to have diarrhea on glipizide. She states that she is compliant with her medications except Lipitor, which she discontinued on her own. She denies CP, dizziness or leg swelling. History of COPD and breathing is at her baseline.    Observations/Objective: Patient with regular voice tone, rate and rhythm. Speaking calmly and is in no apparent distress.    Assessment and Plan: 1. Hypothyroidism, unspecified type 2. Essential hypertension 3. Gout of multiple sites, unspecified cause, unspecified chronicity 4. Type 2 diabetes mellitus with diabetic neuropathy, without long-term current use of insulin (HCC) - gabapentin (NEURONTIN) 300 MG capsule; Take 1 capsule (300 mg total) by mouth 2 (two) times daily. Start with 1 capsule by mouth QHS x 2 weeks then increase to BID.  Dispense: 60 capsule; Refill: 1   Continue with current medications. Add  gabapentin. Follow up in 4 weeks.   Follow Up Instructions:  We discussed hand washing, using hand sanitizer when soap and water are not available, only going out when absolutely necessary, and social distancing. Explained to patient that she is immunocompromised and will need to take precautions during this time.   I discussed the assessment and treatment plan with the patient. The patient was provided an opportunity to ask questions and all were answered. The patient agreed with the plan and demonstrated an understanding of the instructions.   The patient was advised to call back or seek an in-person evaluation if the symptoms worsen or if the condition fails to improve as anticipated.  I provided 10 minutes of non-face-to-face time during this encounter.  Ms. Andr L. Riley Lam, FNP-BC [facility] [address] [place], [place] [address] [phone]

## 2020-01-14 NOTE — Patient Instructions (Signed)
Diabetic Nephropathy  Diabetic nephropathy is kidney disease that is caused by diabetes (diabetes mellitus). Kidneys are organs that filter and clean blood and get rid of body waste products and extra fluid. Diabetes can cause gradual kidney damage over many years. Diabetic nephropathy that continues to get worse can lead to kidney failure. What are the causes? This condition is caused by kidney damage from diabetes that is not well controlled with treatment. Having high blood sugar (glucose) for a long time because of diabetes can damage blood vessels in the kidneys and cause them to thicken and become scarred. Those changes prevent the kidneys from functioning normally. What increases the risk? This condition is more likely to develop in people with diabetes who:  Have had diabetes for many years.  Have high blood pressure.  Have high blood glucose levels over a long period of time.  Have a family history of kidney disease.  Have a history of tobacco use.  Have certain genes that are passed from parent to child (inherited). What are the signs or symptoms? This condition may not cause symptoms at first. If you do have symptoms, they may include:  Swelling of your hands, feet, or ankles.  Weakness.  Poor appetite.  Nausea.  Confusion.  Tiredness (fatigue).  Trouble sleeping.  Dry, itchy skin. If nephropathy leads to kidney failure, symptoms may include:  Vomiting.  Shortness of breath.  Jerky movements that you cannot control (seizure).  Coma. How is this diagnosed? It is important to diagnose this condition before symptoms develop. You may be screened for diabetic nephropathy at a routine health care visit. Screening tests may include:  Urine tests. These may be done every year.  Urine collection over a 24-hour period to measure kidney function.  Blood tests to measure blood glucose levels and kidney function.  Regular blood pressure monitoring. If your health  care provider suspects diabetic nephropathy, he or she may:  Review your medical history and symptoms.  Do a physical exam.  Do an ultrasound of your kidneys.  Perform a procedure to take a sample of kidney tissue for testing (biopsy). How is this treated? The goal of treatment is to prevent or slow down any damage to your kidneys by managing your diabetes. To do this, it is important to control:  Your blood pressure. ? Your target blood pressure may vary depending on your medical conditions, your age, and other factors. ? To help control blood pressure, you may be prescribed medicines to lower your blood pressure (ACE inhibitors) or to help your body get rid of excess fluid (diuretics).  Your A1c (hemoglobin A1c) level. Generally, the goal of treatment is to maintain an A1c level of less than 7%.  Your blood glucose level.  Your blood lipids. If you have high cholesterol, you may need to take lipid-lowering drugs, such as statins. Other treatments may include:  Medicines, including insulin injections.  Lifestyle changes, such as losing weight, quitting smoking, or making changes to your diet. If your disease progresses to end-stage kidney failure, treatment may include:  Dialysis. This is a procedure to filter your blood with a machine.  Kidney transplant. Follow these instructions at home: Eating and drinking  Eat healthy foods, and eat healthy snacks between meals. Follow instructions from your health care provider about eating and drinking restrictions.  Limit your sodium (salt), protein, or fluid intake as directed.  If you drink alcohol: ? Limit how much you use to:  0-1 drink a day for nonpregnant   women.  0-2 drinks a day for men. ? Be aware of how much alcohol is in your drink. In the [place] one drink equals one 12 oz bottle of beer (355 mL), one 5 oz glass of wine (148 mL), or one 1 oz glass of hard liquor (44 mL). Lifestyle  Maintain a healthy weight. Work  with your health care provider to lose weight, if needed.  Do not use any products that contain nicotine or tobacco, such as cigarettes, e-cigarettes, and chewing tobacco. If you need help quitting, ask your health care provider.  Be physically active every day. Ask your health care provider what type of exercise is best for you.  Work with your health care provider to manage your blood pressure. General instructions      Follow your diabetes management plan as directed. ? Check your blood glucose levels as directed by your health care provider. ? Keep your blood glucose in your target range as directed by your health care provider. ? Have your A1c level checked two or more times a year, or as often as told by your health care provider.  Measure your blood pressure regularly at home, as told by your health care provider.  Take over-the-counter and prescription medicines only as told by your health care provider. These include insulin and supplements.  Keep all follow-up visits and routine visits as told by your health care provider. This is important. Make sure you get screening tests as directed. Where to find more information American Diabetes Association: www.diabetes.org Contact a health care provider if:  You have trouble keeping your blood glucose in your goal range.  Your blood glucose level is higher than 240 mg/dL (13.3 mmol/L) for 2 days in a row.  You have swelling in your hands, ankles, or feet.  You feel weak, tired, or dizzy.  You have sudden muscle tightening (spasms).  You have nausea or vomiting.  You feel tired all the time. Get help right away if:  You are very sleepy.  You faint.  You have: ? A seizure. ? Severe, painful muscle spasms. ? Shortness of breath. ? Chest pain. Summary  Diabetic nephropathy is kidney disease that is caused by diabetes (diabetes mellitus).  Keep your blood sugar (glucose) in your target range as directed by your  health care provider.  Work with your health care provider to manage your blood pressure.  Keep all follow-up visits and routine visits as told by your health care provider. This is important. Make sure you get screening tests as directed. This information is not intended to replace advice given to you by your health care provider. Make sure you discuss any questions you have with your health care provider. Document Revised: 03/09/2019 Document Reviewed: 03/09/2019 Elsevier Patient Education  2020 Elsevier Inc.  

## 2020-01-19 ENCOUNTER — Encounter (INDEPENDENT_AMBULATORY_CARE_PROVIDER_SITE_OTHER): Payer: Self-pay

## 2020-01-19 ENCOUNTER — Other Ambulatory Visit: Payer: Self-pay

## 2020-01-19 ENCOUNTER — Ambulatory Visit: Payer: Self-pay | Admitting: Pharmacy Technician

## 2020-01-19 ENCOUNTER — Telehealth: Payer: Self-pay | Admitting: Pharmacy Technician

## 2020-01-19 DIAGNOSIS — Z79899 Other long term (current) drug therapy: Secondary | ICD-10-CM

## 2020-01-19 NOTE — Telephone Encounter (Signed)
Received updated proof of income.  Patient eligible to receive medication assistance at [facility] until time for re-certification in 9359, and as long as eligibility requirements continue to be met.  [facility]

## 2020-01-19 NOTE — Progress Notes (Signed)
Assisted patient with paperwork for re-certification.  Sherilyn Dacosta Care Manager [facility]

## 2020-02-02 ENCOUNTER — Ambulatory Visit: Payer: Self-pay | Admitting: Pharmacy Technician

## 2020-02-02 ENCOUNTER — Other Ambulatory Visit: Payer: Self-pay

## 2020-02-02 DIAGNOSIS — Z79899 Other long term (current) drug therapy: Secondary | ICD-10-CM

## 2020-02-02 NOTE — Progress Notes (Signed)
Assisted patient with eligibility paperwork.  Sherilyn Dacosta [facility]

## 2020-02-08 ENCOUNTER — Telehealth: Payer: Self-pay | Admitting: Pharmacist

## 2020-02-08 NOTE — Telephone Encounter (Signed)
02/08/2020 9:36:59 AM - Faxed [employer] renewal & scripts for Ventolin/Advair  -- Rhetta Mura - Monday, Feb 08, 2020 9:35 AM --Faxed [employer] scripts and renewal for Ventolin HFA Inhale 2 puffs every 6 hours as needed for wheezing or shortness of breath, also Advair 500/2mcg Inhale 1 puff into the lungs every morning and at bedtime-rinse mouth and spit after each use.

## 2020-02-10 ENCOUNTER — Ambulatory Visit: Payer: Self-pay | Attending: Oncology | Admitting: *Deleted

## 2020-02-10 ENCOUNTER — Other Ambulatory Visit: Payer: Self-pay

## 2020-02-10 ENCOUNTER — Encounter: Payer: Self-pay | Admitting: *Deleted

## 2020-02-10 VITALS — BP 129/54 | HR 85 | Temp 97.1°F | Ht 63.0 in | Wt 209.0 lb

## 2020-02-10 DIAGNOSIS — Z0000 Encounter for general adult medical examination without abnormal findings: Secondary | ICD-10-CM

## 2020-02-10 DIAGNOSIS — N6452 Nipple discharge: Secondary | ICD-10-CM

## 2020-02-10 NOTE — Progress Notes (Signed)
  Subjective:     Patient ID: Amy Knapp, female   DOB: [birth year], 64 y.o.   MRN: [id number]  HPI   Review of Systems     Objective:   Physical Exam Chest:     Breasts:        Right: Tenderness present. No swelling, bleeding, inverted nipple, mass, nipple discharge or skin change.        Left: Nipple discharge and tenderness present. No swelling, bleeding, inverted nipple, mass or skin change.  Lymphadenopathy:     Upper Body:     Right upper body: No supraclavicular or axillary adenopathy.     Left upper body: No supraclavicular or axillary adenopathy.        Assessment:     64 year old White female returns to [facility] for annual screening.  Patient complains of bilateral breast tenderness.  States it's intermittent.  Rates pain a 0 with an occassional 7.  On clinical breast exam bilateral breast are tender to palpation especially in the outer quadrants.  There is no dominant mass, skin changes, lymphadenopathy or nipple discharge on exam.  Patient states she had 1 episode of clear nipple discharge.  States she had about 3 drops come out after a shower.  Taught self breast awareness.  Patient with a history of hysterectomy.  Pap omitted per protocol.  Patient has been screened for eligibility.  She does not have any insurance, Medicare or Medicaid.  She also meets financial eligibility.   Risk Assessment    Risk Scores      02/10/2020 12/15/2018   Last edited by: Amy Presto, RN Amy Like, RN   5-year risk: 1.1 % 1 %   Lifetime risk: 5.2 % 5.3 %            Plan:     Bilateral diagnostic mammogram and ultrasound ordered for the left nipple discharge.  Discussed diffuse bilateral breast pain.  She is to decrease caffeine, try vitamin E and / or Evening Primrose Oil after discussing with her pharmacist.  Will follow up per [facility] protocol.

## 2020-02-10 NOTE — Patient Instructions (Signed)

## 2020-02-11 ENCOUNTER — Ambulatory Visit: Payer: Self-pay | Admitting: Urology

## 2020-02-11 VITALS — BP 132/77 | HR 73 | Temp 97.2°F | Ht 63.0 in | Wt 212.4 lb

## 2020-02-11 DIAGNOSIS — G8929 Other chronic pain: Secondary | ICD-10-CM

## 2020-02-11 DIAGNOSIS — R062 Wheezing: Secondary | ICD-10-CM

## 2020-02-11 DIAGNOSIS — M545 Low back pain, unspecified: Secondary | ICD-10-CM

## 2020-02-11 MED ORDER — PREDNISONE 10 MG (21) PO TBPK: ORAL_TABLET | ORAL | 0 refills | Status: DC

## 2020-02-11 NOTE — Progress Notes (Signed)
Patient: Amy Knapp Female    DOB: 1956-03-10   64 y.o.   MRN: [id number] Visit Date: 02/11/2020  Today's Provider: [facility]   Chief Complaint  Patient presents with  . Follow-up  . Breathing Problem    Wheezing for several days, gets worse at night when she lies down and getting up is diffcult  . Abdominal Pain    Started in the last week around her ribs, rates a 7  . Chest Pain    In the morning when she wakes up, 5-10 minutes each morning, started two days after side pains started, rates a 9  . Back Pain    When standing for a long time, rates a 9   Subjective:    HPI  She is experiencing wheezing for the last several days.  It is worse in the evening.    She is continuing to use her Advair 250/50.  She does have the Advair 500/50 on hand.  She is not experiencing fevers, chills or productive cough.    She has also had issues with a left nipple discharge for which she is currently scheduled for breast imaging.    She has been experiencing LBP for almost one year.  It is worsened by standing.  It is not a radiating pain.    Allergies  Allergen Reactions  . Celery Oil     itching  . Demerol [Meperidine]     Burning, feels hot  . Morphine And Related Other (See Comments)    Muscle Spasms  . Penicillins     blisters   Previous Medications   ADVAIR DISKUS 500-50 MCG/DOSE AEPB    INHALE 1 PUFF INTO THE LUNGS EVERY MORNING AND AT BEDTIME. RINSE MOUTH AND SPIT AFTER USE.   ALBUTEROL (VENTOLIN HFA) 108 (90 BASE) MCG/ACT INHALER    Inhale 2 puffs into the lungs every 6 (six) hours as needed for wheezing or shortness of breath.   ALLOPURINOL (ZYLOPRIM) 100 MG TABLET    Take 1 tablet (100 mg total) by mouth daily.   ATORVASTATIN (LIPITOR) 10 MG TABLET    Take 1 tablet (10 mg total) by mouth daily.   CHANTIX CONTINUING MONTH PAK 1 MG TABLET    TAKE 1 TABLET BY MOUTH 2 TIMES A DAY FOR 11 WEEKS   GABAPENTIN (NEURONTIN) 300 MG CAPSULE    Take 1 capsule (300  mg total) by mouth 2 (two) times daily. Start with 1 capsule by mouth QHS x 2 weeks then increase to BID.   GLIPIZIDE (GLUCOTROL) 5 MG TABLET    Take 1 tablet (5 mg total) by mouth 2 (two) times daily before a meal.   LEVOTHYROXINE (SYNTHROID) 100 MCG TABLET    TAKE ONE TABLET BY MOUTH EVERY DAY BEFORE BREAKFAST.   MELOXICAM (MOBIC) 7.5 MG TABLET    Take 1 tablet (7.5 mg total) by mouth daily.   TRAZODONE (DESYREL) 50 MG TABLET    Take 1 tablet (50 mg total) by mouth at bedtime.   VARENICLINE (CHANTIX STARTING MONTH PAK) 0.5 MG X 11 & 1 MG X 42 TABLET    Take one 0.5 mg tablet by mouth once daily for 3 days, then increase to one 0.5 mg tablet twice daily for 4 days, then increase to one 1 mg tablet twice daily.    Review of Systems  Social History   Tobacco Use  . Smoking status: Current Every Day Smoker    Packs/day: 0.05    Years:  49.00    Pack years: 2.45    Types: Cigarettes  . Smokeless tobacco: Never Used  . Tobacco comment: Smoking about half a cigarette a day  Substance Use Topics  . Alcohol use: No   Objective:   BP 132/77   Pulse 73   Temp (!) 97.2 F (36.2 C)   Ht 5\' 3"  (1.6 m)   Wt 212 lb 6.4 oz (96.3 kg)   SpO2 94%   BMI 37.62 kg/m   Physical Exam      Assessment & Plan:   1. Wheezing - DG Chest 2 View; Future - advised patient to switch to the higher dose Advair - prescribed a prednisone taper - advised to seek treatment in the ED if she does not find relief with the prednisone taper or with the increase in the Advair  2. Chronic bilateral low back pain, unspecified whether sciatica present - appointment with Dr. Vickki Hearing  [facility]   [facility]

## 2020-02-15 ENCOUNTER — Ambulatory Visit
Admission: RE | Admit: 2020-02-15 | Discharge: 2020-02-15 | Disposition: A | Discharge status: Home / Self Care | Payer: Self-pay | Attending: Urology | Admitting: Urology

## 2020-02-15 ENCOUNTER — Ambulatory Visit
Admission: RE | Admit: 2020-02-15 | Discharge: 2020-02-15 | Disposition: A | Discharge status: Home / Self Care | Payer: Self-pay | Source: Ambulatory Visit | Attending: Urology | Admitting: Urology

## 2020-02-15 ENCOUNTER — Other Ambulatory Visit: Payer: Self-pay

## 2020-02-15 DIAGNOSIS — R062 Wheezing: Secondary | ICD-10-CM | POA: Insufficient documentation

## 2020-02-15 DIAGNOSIS — Z87898 Personal history of other specified conditions: Secondary | ICD-10-CM | POA: Insufficient documentation

## 2020-02-17 ENCOUNTER — Ambulatory Visit
Admission: RE | Admit: 2020-02-17 | Discharge: 2020-02-17 | Disposition: A | Discharge status: Home / Self Care | Payer: Self-pay | Source: Ambulatory Visit | Attending: Oncology | Admitting: Oncology

## 2020-02-17 DIAGNOSIS — N6452 Nipple discharge: Secondary | ICD-10-CM

## 2020-02-19 ENCOUNTER — Encounter: Payer: Self-pay | Admitting: *Deleted

## 2020-02-19 NOTE — Progress Notes (Signed)
Letter mailed from [facility] to inform patient of her normal mammogram results.  Patient is to follow-up with annual screening in one year. 

## 2020-02-23 ENCOUNTER — Telehealth: Payer: Self-pay

## 2020-02-23 NOTE — Telephone Encounter (Signed)
-----   Message from Ezekiel Ina sent at 02/23/2020  8:56 AM EDT ----- Regarding: TEST This is a test.

## 2020-02-25 ENCOUNTER — Telehealth: Payer: Self-pay | Admitting: Urology

## 2020-02-25 ENCOUNTER — Other Ambulatory Visit: Payer: Self-pay

## 2020-02-25 ENCOUNTER — Ambulatory Visit: Payer: Self-pay | Admitting: Urology

## 2020-02-25 VITALS — BP 105/66 | HR 85 | Temp 97.2°F | Ht 62.0 in | Wt 216.0 lb

## 2020-02-25 DIAGNOSIS — F17200 Nicotine dependence, unspecified, uncomplicated: Secondary | ICD-10-CM

## 2020-02-25 DIAGNOSIS — R062 Wheezing: Secondary | ICD-10-CM

## 2020-02-25 DIAGNOSIS — N6452 Nipple discharge: Secondary | ICD-10-CM

## 2020-02-25 DIAGNOSIS — E1140 Type 2 diabetes mellitus with diabetic neuropathy, unspecified: Secondary | ICD-10-CM

## 2020-02-25 DIAGNOSIS — J440 Chronic obstructive pulmonary disease with acute lower respiratory infection: Secondary | ICD-10-CM

## 2020-02-25 MED ORDER — OMEPRAZOLE 20 MG PO CPDR
20.0000 mg | DELAYED_RELEASE_CAPSULE | Freq: Every day | ORAL | 1 refills | Status: DC
Start: 2020-02-25 — End: 2020-04-28

## 2020-02-25 MED ORDER — COMBIVENT RESPIMAT 20-100 MCG/ACT IN AERS: 1.0000 | INHALATION_SPRAY | Freq: Four times a day (QID) | RESPIRATORY_TRACT | 3 refills | Status: DC

## 2020-02-25 MED ORDER — MONTELUKAST SODIUM 10 MG PO TABS: 10.0000 mg | ORAL_TABLET | Freq: Every day | ORAL | 1 refills | Status: DC

## 2020-02-25 NOTE — Telephone Encounter (Signed)
Would Mrs. Amy Knapp qualify for a screening CT of her lungs due to her smoking history?

## 2020-02-25 NOTE — Progress Notes (Signed)
Patient: Amy Knapp Female    DOB: 28-Oct-1955   64 y.o.   MRN: [id number] Visit Date: 02/25/2020  Today's Provider: [facility]   Chief Complaint  Patient presents with  . Follow-up    mammogram test results  . Diabetes    concern for BG - has gotten up to 517 usually 200s though   Subjective:    HPI Amy Knapp is a 64 y.o. female with DM, nipple discharge and COPD who presents today for follow up.    DM Patient is concerned as her blood sugars are spiking to 517.  She is still on her prednisone taper for her COPD.  Nipple discharge Mammogram and Breast US Feb 17, 2020: Bilateral screening mammogram in 1 year is recommended. Concerning nipple discharge was discussed with the patient. If the patient develops concerning nipple discharge further evaluation with breast MRI and surgical consultation may be warranted.  She is still experiencing nipple discharge.  COPD Patient is currently on prednisone taper and has started the increased dose of the Advair 500/50.  Chest x-ray dated Feb 15, 2020 was negative.  She is still continuing to have a wheeze.  Allergies  Allergen Reactions  . Celery Oil     itching  . Demerol [Meperidine]     Burning, feels hot  . Morphine And Related Other (See Comments)    Muscle Spasms  . Penicillins     blisters   Previous Medications   ADVAIR DISKUS 500-50 MCG/DOSE AEPB    INHALE 1 PUFF INTO THE LUNGS EVERY MORNING AND AT BEDTIME. RINSE MOUTH AND SPIT AFTER USE.   ALBUTEROL (VENTOLIN HFA) 108 (90 BASE) MCG/ACT INHALER    Inhale 2 puffs into the lungs every 6 (six) hours as needed for wheezing or shortness of breath.   ALLOPURINOL (ZYLOPRIM) 100 MG TABLET    Take 1 tablet (100 mg total) by mouth daily.   ATORVASTATIN (LIPITOR) 10 MG TABLET    Take 1 tablet (10 mg total) by mouth daily.   CHANTIX CONTINUING MONTH PAK 1 MG TABLET    TAKE 1 TABLET BY MOUTH 2 TIMES A DAY FOR 11 WEEKS   GABAPENTIN (NEURONTIN) 300 MG  CAPSULE    Take 1 capsule (300 mg total) by mouth 2 (two) times daily. Start with 1 capsule by mouth QHS x 2 weeks then increase to BID.   GLIPIZIDE (GLUCOTROL) 5 MG TABLET    Take 1 tablet (5 mg total) by mouth 2 (two) times daily before a meal.   LEVOTHYROXINE (SYNTHROID) 100 MCG TABLET    TAKE ONE TABLET BY MOUTH EVERY DAY BEFORE BREAKFAST.   PREDNISONE (STERAPRED UNI-PAK 21 TAB) 10 MG (21) TBPK TABLET    Take 6 tablets day 1, take 5 tablets day 2, take 4 tablets day 3, take 3 tablets day 4, take 2 tablets day 5 and take one tablet day 6   TRAZODONE (DESYREL) 50 MG TABLET    Take 1 tablet (50 mg total) by mouth at bedtime.   VARENICLINE (CHANTIX STARTING MONTH PAK) 0.5 MG X 11 & 1 MG X 42 TABLET    Take one 0.5 mg tablet by mouth once daily for 3 days, then increase to one 0.5 mg tablet twice daily for 4 days, then increase to one 1 mg tablet twice daily.    Review of Systems  Social History   Tobacco Use  . Smoking status: Current Every Day Smoker    Packs/day: 0.05  Years: 49.00    Pack years: 2.45    Types: Cigarettes  . Smokeless tobacco: Never Used  . Tobacco comment: Smoking about half a cigarette a day  Substance Use Topics  . Alcohol use: No   Objective:   BP 105/66 (BP Location: Left Arm, Patient Position: Sitting, Cuff Size: Normal)   Pulse 85   Temp (!) 97.2 F (36.2 C)   Ht 5\' 2"  (1.575 m)   Wt 216 lb (98 kg)   SpO2 93%   BMI 39.51 kg/m   Physical Exam Constitutional:  Well nourished. Alert and oriented, No acute distress. HEENT: [place] AT, mask in place.  Trachea midline Cardiovascular: No clubbing, cyanosis, or edema. Respiratory: Normal respiratory effort, no increased work of breathing.  Wheezing.  Neurologic: Grossly intact, no focal deficits, moving all 4 extremities. Psychiatric: Normal mood and affect.    Assessment & Plan:   1. Smoker, current status unknown - CT CHEST LUNG CA SCREEN LOW DOSE W/O CM; Future - She is continuing to attempt to  decrease her tobacco use  2. Wheezing - chest xray normal - CT chest pending - We will add Singulair 10 mg daily and Trelegy one puff daily - advised to seek treatment in the ED for possible breathing treatment, but patient declined - follow up in two weeks for symptom recheck  3. Acute bronchitis with COPD (HCC) - see above  4. Type 2 diabetes mellitus with diabetic neuropathy, without long-term current use of insulin (HCC) - Explained to the patient that prednisone will increase blood sugars and she states she has noticed that they have started to reduce a bit to around 300 - follow up in two weeks to review blood sugars  5. Nipple discharge -Advised the patient to contact [facility] to pursue MRI of the breast as the nipple discharge is still occurring    [facility]   [facility]

## 2020-02-26 ENCOUNTER — Other Ambulatory Visit: Payer: Self-pay | Admitting: Gerontology

## 2020-02-26 ENCOUNTER — Other Ambulatory Visit: Payer: Self-pay

## 2020-02-26 DIAGNOSIS — J441 Chronic obstructive pulmonary disease with (acute) exacerbation: Secondary | ICD-10-CM

## 2020-03-01 ENCOUNTER — Other Ambulatory Visit: Payer: Self-pay | Admitting: Gerontology

## 2020-03-01 DIAGNOSIS — F17200 Nicotine dependence, unspecified, uncomplicated: Secondary | ICD-10-CM

## 2020-03-02 ENCOUNTER — Other Ambulatory Visit: Payer: Self-pay

## 2020-03-02 ENCOUNTER — Telehealth: Payer: Self-pay | Admitting: Pharmacist

## 2020-03-02 MED ORDER — TRELEGY ELLIPTA 100-62.5-25 MCG/INH IN AEPB: 1.0000 | INHALATION_SPRAY | Freq: Every day | RESPIRATORY_TRACT | 2 refills | Status: AC

## 2020-03-02 NOTE — Telephone Encounter (Signed)
03/02/2020 3:53:37 PM - Trelegy Ellipta replaces Advair & Combivent  -- Rhetta Mura - Wednesday, Mar 02, 2020 3:51 PM --Received a pharmacy printout from [facility] for new med-Trelegy Ellipta 100-62.5-25mcg Inhale 1 puff into the lungs every day (replaces Advair & Combivent). I have printed script will take to [facility] for Shannon to sign. Patient is already enrolled with [employer] for Ventolin.

## 2020-03-09 ENCOUNTER — Telehealth: Payer: Self-pay | Admitting: Gerontology

## 2020-03-09 NOTE — Telephone Encounter (Signed)
Called pt to schedule an appt with Dr. Justice Rocher. I was able to LVM and a call back number

## 2020-03-10 ENCOUNTER — Telehealth: Payer: Self-pay | Admitting: Gerontology

## 2020-03-10 ENCOUNTER — Other Ambulatory Visit: Payer: Self-pay

## 2020-03-10 ENCOUNTER — Ambulatory Visit: Payer: Self-pay | Admitting: Gerontology

## 2020-03-10 DIAGNOSIS — E785 Hyperlipidemia, unspecified: Secondary | ICD-10-CM

## 2020-03-10 DIAGNOSIS — E1142 Type 2 diabetes mellitus with diabetic polyneuropathy: Secondary | ICD-10-CM | POA: Insufficient documentation

## 2020-03-10 DIAGNOSIS — G4700 Insomnia, unspecified: Secondary | ICD-10-CM

## 2020-03-10 DIAGNOSIS — J42 Unspecified chronic bronchitis: Secondary | ICD-10-CM

## 2020-03-10 DIAGNOSIS — F17200 Nicotine dependence, unspecified, uncomplicated: Secondary | ICD-10-CM

## 2020-03-10 DIAGNOSIS — E1140 Type 2 diabetes mellitus with diabetic neuropathy, unspecified: Secondary | ICD-10-CM | POA: Insufficient documentation

## 2020-03-10 DIAGNOSIS — N6452 Nipple discharge: Secondary | ICD-10-CM | POA: Insufficient documentation

## 2020-03-10 MED ORDER — GLIPIZIDE 5 MG PO TABS: 5.0000 mg | ORAL_TABLET | Freq: Every day | ORAL | 2 refills | Status: DC

## 2020-03-10 MED ORDER — GABAPENTIN 400 MG PO CAPS: 400.0000 mg | ORAL_CAPSULE | Freq: Two times a day (BID) | ORAL | 2 refills | Status: DC

## 2020-03-10 MED ORDER — VARENICLINE TARTRATE 1 MG PO TABS: ORAL_TABLET | ORAL | 1 refills | Status: DC

## 2020-03-10 MED ORDER — ATORVASTATIN CALCIUM 10 MG PO TABS: 10.0000 mg | ORAL_TABLET | Freq: Every day | ORAL | 2 refills | Status: DC

## 2020-03-10 MED ORDER — TRAZODONE HCL 50 MG PO TABS: 50.0000 mg | ORAL_TABLET | Freq: Every day | ORAL | 1 refills | Status: DC

## 2020-03-10 NOTE — Patient Instructions (Signed)
Carbohydrate Counting for Diabetes Mellitus, Adult  Carbohydrate counting is a method of keeping track of how many carbohydrates you eat. Eating carbohydrates naturally increases the amount of sugar (glucose) in the blood. Counting how many carbohydrates you eat helps keep your blood glucose within normal limits, which helps you manage your diabetes (diabetes mellitus). It is important to know how many carbohydrates you can safely have in each meal. This is different for every person. A diet and nutrition specialist (registered dietitian) can help you make a meal plan and calculate how many carbohydrates you should have at each meal and snack. Carbohydrates are found in the following foods:  Grains, such as breads and cereals.  Dried beans and soy products.  Starchy vegetables, such as potatoes, peas, and corn.  Fruit and fruit juices.  Milk and yogurt.  Sweets and snack foods, such as cake, cookies, candy, chips, and soft drinks. How do I count carbohydrates? There are two ways to count carbohydrates in food. You can use either of the methods or a combination of both. Reading "Nutrition Facts" on packaged food The "Nutrition Facts" list is included on the labels of almost all packaged foods and beverages in the [place] It includes:  The serving size.  Information about nutrients in each serving, including the grams (g) of carbohydrate per serving. To use the "Nutrition Facts":  Decide how many servings you will have.  Multiply the number of servings by the number of carbohydrates per serving.  The resulting number is the total amount of carbohydrates that you will be having. Learning standard serving sizes of other foods When you eat carbohydrate foods that are not packaged or do not include "Nutrition Facts" on the label, you need to measure the servings in order to count the amount of carbohydrates:  Measure the foods that you will eat with a food scale or measuring cup, if  needed.  Decide how many standard-size servings you will eat.  Multiply the number of servings by 15. Most carbohydrate-rich foods have about 15 g of carbohydrates per serving. ? For example, if you eat 8 oz (170 g) of strawberries, you will have eaten 2 servings and 30 g of carbohydrates (2 servings x 15 g = 30 g).  For foods that have more than one food mixed, such as soups and casseroles, you must count the carbohydrates in each food that is included. The following list contains standard serving sizes of common carbohydrate-rich foods. Each of these servings has about 15 g of carbohydrates:   hamburger bun or  English muffin.   oz (15 mL) syrup.   oz (14 g) jelly.  1 slice of bread.  1 six-inch tortilla.  3 oz (85 g) cooked rice or pasta.  4 oz (113 g) cooked dried beans.  4 oz (113 g) starchy vegetable, such as peas, corn, or potatoes.  4 oz (113 g) hot cereal.  4 oz (113 g) mashed potatoes or  of a large baked potato.  4 oz (113 g) canned or frozen fruit.  4 oz (120 mL) fruit juice.  4-6 crackers.  6 chicken nuggets.  6 oz (170 g) unsweetened dry cereal.  6 oz (170 g) plain fat-free yogurt or yogurt sweetened with artificial sweeteners.  8 oz (240 mL) milk.  8 oz (170 g) fresh fruit or one small piece of fruit.  24 oz (680 g) popped popcorn. Example of carbohydrate counting Sample meal  3 oz (85 g) chicken breast.  6 oz (170 g)   brown rice.  4 oz (113 g) corn.  8 oz (240 mL) milk.  8 oz (170 g) strawberries with sugar-free whipped topping. Carbohydrate calculation 1. Identify the foods that contain carbohydrates: ? Rice. ? Corn. ? Milk. ? Strawberries. 2. Calculate how many servings you have of each food: ? 2 servings rice. ? 1 serving corn. ? 1 serving milk. ? 1 serving strawberries. 3. Multiply each number of servings by 15 g: ? 2 servings rice x 15 g = 30 g. ? 1 serving corn x 15 g = 15 g. ? 1 serving milk x 15 g = 15 g. ? 1  serving strawberries x 15 g = 15 g. 4. Add together all of the amounts to find the total grams of carbohydrates eaten: ? 30 g + 15 g + 15 g + 15 g = 75 g of carbohydrates total. Summary  Carbohydrate counting is a method of keeping track of how many carbohydrates you eat.  Eating carbohydrates naturally increases the amount of sugar (glucose) in the blood.  Counting how many carbohydrates you eat helps keep your blood glucose within normal limits, which helps you manage your diabetes.  A diet and nutrition specialist (registered dietitian) can help you make a meal plan and calculate how many carbohydrates you should have at each meal and snack. This information is not intended to replace advice given to you by your health care provider. Make sure you discuss any questions you have with your health care provider. Document Revised: 04/18/2017 Document Reviewed: 03/07/2016 Elsevier Patient Education  2020 Elsevier Inc.  

## 2020-03-10 NOTE — Telephone Encounter (Signed)
I called [name] to get her scheduled with our ortho doctor. I was able to leave a message

## 2020-03-10 NOTE — Progress Notes (Signed)
Established Patient Office Visit  Subjective:  Patient ID: Amy Knapp, female    DOB: [birth year]  Age: 64 y.o. MRN: [id number]  CC:  Chief Complaint  Patient presents with   Peripheral Neuropathy    left leg, throbs   Patient consents to telephone visit and 2 patient identifiers was used to identify patient. HPI Amy Knapp presents for follow up of type 2 diabetes, and peripheral neuropathy. Her HgbA1c done on 11/04/2019 was 7.1%, she states that she checks her blood glucose bid and her fasting readings are usually between 124-130 mg/dl and PM readings are between 144-156 mg/dl. She denies hypo/hyperglycemic symptoms. She reports that she takes 5 mg glipizide daily and not bid because of GI symptoms. She states that taking 300 mg gabapentin bid does not relieve her neuropathy. She reports that her last nipple discharge was 2 weeks ago, and she denies breast pain. She had Mammogram and Ultra sound to her breast on 02/17/2020 and it showed no evidence of malignancy in either breast.  RECOMMENDATION: Bilateral screening mammogram in 1 year is recommended. Concerning nipple discharge was discussed with the patient. If the patient develops concerning nipple discharge further evaluation with breast MRI and surgical consultation may be warranted. Per Dr Mordecai Rasmussen A.M. She states that her breathing is stable and she continues to use Chantix for smoking cessation. Overall, she states that she's doing well.   Past Medical History:  Diagnosis Date   Allergy    Anxiety    Arthritis    Asthma    Broken leg 2007   Right   Cataract    COPD (chronic obstructive pulmonary disease) ([place])    Depression    Diabetes mellitus without complication (HCC)    Emphysema of lung (HCC)    GERD (gastroesophageal reflux disease)    Gout    Hyperlipidemia    Sleep apnea    Thyroid disease     Past Surgical History:  Procedure Laterality Date   ABDOMINAL  HYSTERECTOMY  2000   Partial   LEG SURGERY Right 10/08/2005   broken right leg   TUBAL LIGATION      Family History  Problem Relation Age of Onset   Cancer Mother    Heart attack Father    Heart attack Sister    Cancer Brother    Diabetes Daughter    Diabetes Son    Hypertension Maternal Aunt    Breast cancer Neg Hx     Social History   Socioeconomic History   Marital status: Divorced    Spouse name: Not on file   Number of children: 4   Years of education: Not on file   Highest education level: High school graduate  Occupational History   Occupation: Unemployed  Tobacco Use   Smoking status: Current Every Day Smoker    Packs/day: 0.05    Years: 49.00    Pack years: 2.45    Types: Cigarettes   Smokeless tobacco: Never Used   Tobacco comment: Smoking about half a cigarette a day  Substance and Sexual Activity   Alcohol use: No   Drug use: No   Sexual activity: Not Currently  Other Topics Concern   Not on file  Social History Narrative   Not on file   Social Determinants of Health   Financial Resource Strain:    Difficulty of Paying Living Expenses:   Food Insecurity:    Worried About Estate manager/land agent of Food in the Last Year:  Ran Out of Food in the Last Year:   Transportation Needs:    Film/video editor (Medical):    Lack of Transportation (Non-Medical):   Physical Activity:    Days of Exercise per Week:    Minutes of Exercise per Session:   Stress:    Feeling of Stress :   Social Connections:    Frequency of Communication with Friends and Family:    Frequency of Social Gatherings with Friends and Family:    Attends Religious Services:    Active Member of Clubs or Organizations:    Attends Music therapist:    Marital Status:   Intimate Partner Violence:    Fear of Current or Ex-Partner:    Emotionally Abused:    Physically Abused:    Sexually Abused:     Outpatient Medications Prior to  Visit  Medication Sig Dispense Refill   allopurinol (ZYLOPRIM) 100 MG tablet Take 1 tablet (100 mg total) by mouth daily. 90 tablet 3   levothyroxine (SYNTHROID) 100 MCG tablet TAKE ONE TABLET BY MOUTH EVERY DAY BEFORE BREAKFAST. 90 tablet 3   montelukast (SINGULAIR) 10 MG tablet Take 1 tablet (10 mg total) by mouth at bedtime. 30 tablet 1   omeprazole (PRILOSEC) 20 MG capsule Take 1 capsule (20 mg total) by mouth daily. 30 capsule 1   VENTOLIN HFA 108 (90 Base) MCG/ACT inhaler INHALE 2 PUFFS INTO THE LUNGS EVERY 6 HOURS AS NEEDED FOR WHEEZING ORSHORTNESS OF BREATH 54 g 0   atorvastatin (LIPITOR) 10 MG tablet Take 1 tablet (10 mg total) by mouth daily. 30 tablet 0   CHANTIX CONTINUING MONTH PAK 1 MG tablet TAKE 1 TABLET BY MOUTH 2 TIMES A DAY FOR 11 WEEKS 112 tablet 0   gabapentin (NEURONTIN) 300 MG capsule Take 1 capsule (300 mg total) by mouth 2 (two) times daily. Start with 1 capsule by mouth QHS x 2 weeks then increase to BID. 60 capsule 1   glipiZIDE (GLUCOTROL) 5 MG tablet Take 1 tablet (5 mg total) by mouth 2 (two) times daily before a meal. (Patient taking differently: Take 5 mg by mouth daily. ) 60 tablet 2   traZODone (DESYREL) 50 MG tablet Take 1 tablet (50 mg total) by mouth at bedtime. 30 tablet 2   Fluticasone-Umeclidin-Vilant (TRELEGY ELLIPTA) 100-62.5-25 MCG/INH AEPB Inhale 1 puff into the lungs daily. 28 each 2   predniSONE (STERAPRED UNI-PAK 21 TAB) 10 MG (21) TBPK tablet Take 6 tablets day 1, take 5 tablets day 2, take 4 tablets day 3, take 3 tablets day 4, take 2 tablets day 5 and take one tablet day 6 (Patient not taking: Reported on 02/25/2020) 21 tablet 0   varenicline (CHANTIX STARTING MONTH PAK) 0.5 MG X 11 & 1 MG X 42 tablet Take one 0.5 mg tablet by mouth once daily for 3 days, then increase to one 0.5 mg tablet twice daily for 4 days, then increase to one 1 mg tablet twice daily. (Patient not taking: Reported on 02/11/2020) 53 tablet 0   No facility-administered  medications prior to visit.    Allergies  Allergen Reactions   Celery Oil     itching   Demerol [Meperidine]     Burning, feels hot   Morphine And Related Other (See Comments)    Muscle Spasms   Penicillins     blisters    ROS Review of Systems  Constitutional: Negative.   Eyes: Negative.   Respiratory: Positive for shortness of breath (COPD,  Asthma).   Cardiovascular: Negative.   Endocrine: Negative.   Neurological: Positive for numbness (Peripheral neuropathy).      Objective:    Physical Exam No physical exam was done. There were no vitals taken for this visit. Wt Readings from Last 3 Encounters:  02/25/20 216 lb (98 kg)  02/11/20 212 lb 6.4 oz (96.3 kg)  02/10/20 209 lb (94.8 kg)     Health Maintenance Due  Topic Date Due   Hepatitis C Screening  Never done   PNEUMOCOCCAL POLYSACCHARIDE VACCINE AGE 23-64 HIGH RISK  Never done   FOOT EXAM  Never done   OPHTHALMOLOGY EXAM  Never done   COVID-19 Vaccine (1) Never done   HIV Screening  Never done   TETANUS/TDAP  Never done   PAP SMEAR-Modifier  Never done   COLONOSCOPY  Never done   URINE MICROALBUMIN  02/26/2020    There are no preventive care reminders to display for this patient.  Lab Results  Component Value Date   TSH 4.040 12/10/2019   Lab Results  Component Value Date   WBC 7.7 02/26/2019   HGB 13.9 02/26/2019   HCT 40.5 02/26/2019   MCV 89 02/26/2019   PLT 297 02/26/2019   Lab Results  Component Value Date   NA 141 02/26/2019   K 4.1 02/26/2019   CO2 25 02/26/2019   GLUCOSE 120 (H) 02/26/2019   BUN 17 02/26/2019   CREATININE 0.80 02/26/2019   BILITOT <0.2 02/26/2019   ALKPHOS 73 02/26/2019   AST 19 02/26/2019   ALT 18 02/26/2019   PROT 6.9 02/26/2019   ALBUMIN 4.3 02/26/2019   CALCIUM 9.3 02/26/2019   ANIONGAP 12 04/18/2013   Lab Results  Component Value Date   CHOL 234 (H) 11/04/2019   Lab Results  Component Value Date   HDL 55 11/04/2019   Lab Results   Component Value Date   LDLCALC 128 (H) 11/04/2019   Lab Results  Component Value Date   TRIG 289 (H) 11/04/2019   Lab Results  Component Value Date   CHOLHDL 4.3 11/04/2019   Lab Results  Component Value Date   HGBA1C 7.1 (H) 11/04/2019      Assessment & Plan:    1. Hyperlipidemia, unspecified hyperlipidemia type - She will continue on current medication, was advised to continue on low fat/cholesterol diet. - atorvastatin (LIPITOR) 10 MG tablet; Take 1 tablet (10 mg total) by mouth daily.  Dispense: 30 tablet; Refill: 2 - Lipid panel; Future  2. Type 2 diabetes mellitus with diabetic neuropathy, without long-term current use of insulin (HCC) - Her HgbA1c was 7.1%, her goal is less than 7%, she will continue on 5 mg glipizide daily and was advised to continue on low carb/non concentrated sweet diet. Her Peripheral neuropathy is not under control, her gabapentin was increased to 400 mg bid, was advised to notify clinic if not effective. - gabapentin (NEURONTIN) 400 MG capsule; Take 1 capsule (400 mg total) by mouth 2 (two) times daily.  Dispense: 60 capsule; Refill: 2 - glipiZIDE (GLUCOTROL) 5 MG tablet; Take 1 tablet (5 mg total) by mouth daily.  Dispense: 30 tablet; Refill: 2 - Comp Met (CMET); Future - HgB A1c; Future - Urine Microalbumin w/creat. ratio - Urinalysis; Future  3. Chronic bronchitis, unspecified chronic bronchitis type ([place]) - She will continue on her current medication and was advised on smoking cessation.  4. Nipple discharge - She was advised to notify clinic with recurrent nipple discharge.  5. Smoker,  current status unknown - She will continue on Chantix for smoking cessation, and will follow up with Ms. Simpson for mental health evaluation. - varenicline (CHANTIX CONTINUING MONTH PAK) 1 MG tablet; TAKE 1 TABLET BY MOUTH 2 TIMES A DAY  Dispense: 60 tablet; Refill: 1  6. Insomnia, unspecified type - She will continue on medication and will follow up  with Ms. Simpson for mental health evaluation. - traZODone (DESYREL) 50 MG tablet; Take 1 tablet (50 mg total) by mouth at bedtime.  Dispense: 30 tablet; Refill: 1    Follow-up: Return in about 5 weeks (around 04/14/2020), or if symptoms worsen or fail to improve.    Donavon Kimrey Jerold Coombe, NP

## 2020-03-14 ENCOUNTER — Telehealth: Payer: Self-pay | Admitting: *Deleted

## 2020-03-14 NOTE — Telephone Encounter (Signed)
Received referral for low dose lung cancer screening CT scan. Message left at phone number listed in EMR for patient to call me back to facilitate scheduling scan.  

## 2020-03-14 NOTE — Telephone Encounter (Signed)
Patient returned call and verbalized reason for CT scan to be ordered is new onset wheezing and coughing as well as routine hemoptysis. Discussed lung screening requirements including being asymptomatic of lung cancer with patient. Discussed importance of patient getting prompt CT scan for evaluation of symptoms. Patient will follow up with PCP.

## 2020-03-16 ENCOUNTER — Other Ambulatory Visit: Payer: Self-pay

## 2020-03-16 ENCOUNTER — Encounter (INDEPENDENT_AMBULATORY_CARE_PROVIDER_SITE_OTHER): Payer: Self-pay

## 2020-03-16 DIAGNOSIS — E785 Hyperlipidemia, unspecified: Secondary | ICD-10-CM

## 2020-03-16 DIAGNOSIS — E1140 Type 2 diabetes mellitus with diabetic neuropathy, unspecified: Secondary | ICD-10-CM

## 2020-03-17 ENCOUNTER — Other Ambulatory Visit: Payer: Self-pay

## 2020-03-17 DIAGNOSIS — R062 Wheezing: Secondary | ICD-10-CM

## 2020-03-17 DIAGNOSIS — R042 Hemoptysis: Secondary | ICD-10-CM

## 2020-03-17 LAB — URINALYSIS
Bilirubin, UA: NEGATIVE
Glucose, UA: NEGATIVE
Leukocytes,UA: NEGATIVE
Nitrite, UA: NEGATIVE
RBC, UA: NEGATIVE
Specific Gravity, UA: 1.025 (ref 1.005–1.030)
Urobilinogen, Ur: 0.2 mg/dL (ref 0.2–1.0)
pH, UA: 5 (ref 5.0–7.5)

## 2020-03-17 LAB — COMPREHENSIVE METABOLIC PANEL
ALT: 34 IU/L — ABNORMAL HIGH (ref 0–32)
AST: 22 IU/L (ref 0–40)
Albumin/Globulin Ratio: 1.7 (ref 1.2–2.2)
Albumin: 4.3 g/dL (ref 3.8–4.8)
Alkaline Phosphatase: 87 IU/L (ref 48–121)
BUN/Creatinine Ratio: 17 (ref 12–28)
BUN: 15 mg/dL (ref 8–27)
Bilirubin Total: 0.3 mg/dL (ref 0.0–1.2)
CO2: 29 mmol/L (ref 20–29)
Calcium: 9.6 mg/dL (ref 8.7–10.3)
Chloride: 101 mmol/L (ref 96–106)
Creatinine, Ser: 0.89 mg/dL (ref 0.57–1.00)
GFR calc Af Amer: 81 mL/min/{1.73_m2} (ref >59)
GFR calc non Af Amer: 70 mL/min/{1.73_m2} (ref >59)
Globulin, Total: 2.5 g/dL (ref 1.5–4.5)
Glucose: 198 mg/dL — ABNORMAL HIGH (ref 65–99)
Potassium: 4.3 mmol/L (ref 3.5–5.2)
Sodium: 140 mmol/L (ref 134–144)
Total Protein: 6.8 g/dL (ref 6.0–8.5)

## 2020-03-17 LAB — LIPID PANEL
Chol/HDL Ratio: 4 ratio (ref 0.0–4.4)
Cholesterol, Total: 217 mg/dL — ABNORMAL HIGH (ref 100–199)
HDL: 54 mg/dL (ref >39)
LDL Chol Calc (NIH): 121 mg/dL — ABNORMAL HIGH (ref 0–99)
Triglycerides: 238 mg/dL — ABNORMAL HIGH (ref 0–149)
VLDL Cholesterol Cal: 42 mg/dL — ABNORMAL HIGH (ref 5–40)

## 2020-03-17 LAB — HEMOGLOBIN A1C
Est. average glucose Bld gHb Est-mCnc: 169 mg/dL
Hgb A1c MFr Bld: 7.5 % — ABNORMAL HIGH (ref 4.8–5.6)

## 2020-03-23 ENCOUNTER — Telehealth: Payer: Self-pay | Admitting: *Deleted

## 2020-03-23 NOTE — Telephone Encounter (Signed)
LVM regarding r/s of lab appt.  °

## 2020-03-24 ENCOUNTER — Other Ambulatory Visit: Payer: Self-pay

## 2020-03-24 ENCOUNTER — Ambulatory Visit: Payer: Self-pay | Admitting: Licensed Clinical Social Worker

## 2020-03-24 DIAGNOSIS — F4323 Adjustment disorder with mixed anxiety and depressed mood: Secondary | ICD-10-CM

## 2020-03-24 NOTE — BH Specialist Note (Signed)
Integrated Behavioral Health Follow Up Visit in office  MRN: [id number] Name: Amy Knapp   Type of Service: Integrated Behavioral Health- Individual/Family Interpretor:No. Interpretor Name and Language: not applicable.   SUBJECTIVE: Amy Knapp is a 64 y.o. female accompanied by herself. Patient was referred by Amy Knapp for mental health. Patient reports the following symptoms/concerns: She reports that she is able to sleep with the Trazodone. She discussed recent situational stressors that occurred this past weekend with her family. She notes that she tried to work at [employer] from May until June 5th. She explains that she has been filing her unemployment and should get her benefits this week. She notes that she gets 187 dollars a week with taxes in unemployment and COVID relief payments. She reports that she has her application with some temp services to get another job and is hoping to find something to keep herself busy. She explains that she is going to try to crochet and get her mind settled on that. She notes that her anxiety has worsened in the last couple of months. She explains that she is experiencing nausea with some of her other medications and could use something to deal with that. She notes that her main strength she draws from church on Sunday's. She discussed unresolved grief with her son,a cousin, and the father of her children. She denies suicidal and homicidal thoughts.  Duration of problem: ; Severity of problem: moderate  OBJECTIVE: Mood: Anxious and Affect: Appropriate Risk of harm to self or others: No plan to harm self or others  LIFE CONTEXT: Family and Social: see above. School/Work: see above. Self-Care: see above.  Life Changes: see above.   GOALS ADDRESSED: Patient will: 1.  Reduce symptoms of: anxiety and depression  2.  Increase knowledge and/or ability of: coping skills  3.  Demonstrate ability to: Increase healthy  adjustment to current life circumstances  INTERVENTIONS: Interventions utilized:  Brief CBT was utilized by the clinician during today's follow up session. Clinician processed with the patient regarding how she has been doing since the last follow up session. Clinician measured the patient's anxiety and depression on a numerical scale. Clinician discussed the concept of gratitude and relaxation techniques in terms of coping skills.  Standardized Assessments completed: GAD-7 and PHQ 9  ASSESSMENT: Patient currently experiencing see above.   Patient may benefit from see above.  PLAN: 1. Follow up with behavioral health clinician on :  2. Behavioral recommendations:  3. Referral(s): [facility] (In Clinic) 4. "From scale of 1-10, how likely are you to follow plan?":   Amy Forts, LCSW

## 2020-03-25 ENCOUNTER — Ambulatory Visit: Admission: RE | Admit: 2020-03-25 | Payer: Self-pay | Source: Ambulatory Visit

## 2020-03-30 ENCOUNTER — Other Ambulatory Visit: Payer: Self-pay

## 2020-03-31 ENCOUNTER — Other Ambulatory Visit: Payer: Self-pay

## 2020-03-31 ENCOUNTER — Ambulatory Visit
Admission: RE | Admit: 2020-03-31 | Discharge: 2020-03-31 | Disposition: A | Discharge status: Home / Self Care | Payer: Self-pay | Source: Ambulatory Visit | Attending: Gerontology | Admitting: Gerontology

## 2020-03-31 DIAGNOSIS — R062 Wheezing: Secondary | ICD-10-CM | POA: Insufficient documentation

## 2020-03-31 DIAGNOSIS — R042 Hemoptysis: Secondary | ICD-10-CM | POA: Insufficient documentation

## 2020-03-31 MED ORDER — IOHEXOL 300 MG/ML  SOLN
75.0000 mL | Freq: Once | INTRAMUSCULAR | Status: AC | PRN
  Administered 2020-03-31: 75 mL via INTRAVENOUS

## 2020-04-05 ENCOUNTER — Ambulatory Visit: Payer: Self-pay | Admitting: Adult Health Nurse Practitioner

## 2020-04-06 ENCOUNTER — Other Ambulatory Visit: Payer: Self-pay

## 2020-04-06 ENCOUNTER — Ambulatory Visit: Payer: Self-pay | Admitting: Gerontology

## 2020-04-07 ENCOUNTER — Telehealth: Payer: Self-pay | Admitting: *Deleted

## 2020-04-07 ENCOUNTER — Ambulatory Visit: Payer: Self-pay | Admitting: Licensed Clinical Social Worker

## 2020-04-07 NOTE — Telephone Encounter (Signed)
LVM regarding r/s missed appts. Went ahead and r/s the appts.

## 2020-04-13 ENCOUNTER — Ambulatory Visit: Payer: Self-pay | Admitting: Gerontology

## 2020-04-13 ENCOUNTER — Other Ambulatory Visit: Payer: Self-pay

## 2020-04-14 ENCOUNTER — Telehealth: Payer: Self-pay | Admitting: Gerontology

## 2020-04-14 NOTE — Telephone Encounter (Signed)
LVM for patient to call back with regards to right lower leg and ankle swelling.

## 2020-04-19 ENCOUNTER — Ambulatory Visit: Payer: Self-pay | Admitting: Specialist

## 2020-04-21 ENCOUNTER — Other Ambulatory Visit: Payer: Self-pay

## 2020-04-21 ENCOUNTER — Ambulatory Visit: Payer: Self-pay | Admitting: Gerontology

## 2020-04-21 DIAGNOSIS — F4323 Adjustment disorder with mixed anxiety and depressed mood: Secondary | ICD-10-CM

## 2020-04-22 LAB — COMPREHENSIVE METABOLIC PANEL
ALT: 44 IU/L — ABNORMAL HIGH (ref 0–32)
AST: 39 IU/L (ref 0–40)
Albumin/Globulin Ratio: 1.7 (ref 1.2–2.2)
Albumin: 4.7 g/dL (ref 3.8–4.8)
Alkaline Phosphatase: 93 IU/L (ref 48–121)
BUN/Creatinine Ratio: 12 (ref 12–28)
BUN: 11 mg/dL (ref 8–27)
Bilirubin Total: 0.6 mg/dL (ref 0.0–1.2)
CO2: 29 mmol/L (ref 20–29)
Calcium: 10.1 mg/dL (ref 8.7–10.3)
Chloride: 101 mmol/L (ref 96–106)
Creatinine, Ser: 0.94 mg/dL (ref 0.57–1.00)
GFR calc Af Amer: 76 mL/min/{1.73_m2} (ref >59)
GFR calc non Af Amer: 66 mL/min/{1.73_m2} (ref >59)
Globulin, Total: 2.7 g/dL (ref 1.5–4.5)
Glucose: 117 mg/dL — ABNORMAL HIGH (ref 65–99)
Potassium: 4 mmol/L (ref 3.5–5.2)
Sodium: 143 mmol/L (ref 134–144)
Total Protein: 7.4 g/dL (ref 6.0–8.5)

## 2020-04-22 LAB — MICROALBUMIN / CREATININE URINE RATIO
Creatinine, Urine: 130 mg/dL
Microalb/Creat Ratio: 30 mg/g creat — ABNORMAL HIGH (ref 0–29)
Microalbumin, Urine: 39.3 ug/mL

## 2020-04-22 LAB — HEMOGLOBIN A1C
Est. average glucose Bld gHb Est-mCnc: 174 mg/dL
Hgb A1c MFr Bld: 7.7 % — ABNORMAL HIGH (ref 4.8–5.6)

## 2020-04-28 ENCOUNTER — Other Ambulatory Visit: Payer: Self-pay

## 2020-04-28 ENCOUNTER — Ambulatory Visit: Payer: Self-pay | Admitting: Family Medicine

## 2020-04-28 VITALS — BP 127/75 | HR 96 | Temp 97.8°F | Resp 16 | Ht 62.0 in | Wt 216.1 lb

## 2020-04-28 DIAGNOSIS — E039 Hypothyroidism, unspecified: Secondary | ICD-10-CM

## 2020-04-28 DIAGNOSIS — G4700 Insomnia, unspecified: Secondary | ICD-10-CM

## 2020-04-28 DIAGNOSIS — E785 Hyperlipidemia, unspecified: Secondary | ICD-10-CM

## 2020-04-28 DIAGNOSIS — I10 Essential (primary) hypertension: Secondary | ICD-10-CM

## 2020-04-28 DIAGNOSIS — E1140 Type 2 diabetes mellitus with diabetic neuropathy, unspecified: Secondary | ICD-10-CM

## 2020-04-28 DIAGNOSIS — J441 Chronic obstructive pulmonary disease with (acute) exacerbation: Secondary | ICD-10-CM

## 2020-04-28 DIAGNOSIS — K219 Gastro-esophageal reflux disease without esophagitis: Secondary | ICD-10-CM

## 2020-04-28 DIAGNOSIS — F17200 Nicotine dependence, unspecified, uncomplicated: Secondary | ICD-10-CM

## 2020-04-28 MED ORDER — ATORVASTATIN CALCIUM 10 MG PO TABS: 10.0000 mg | ORAL_TABLET | Freq: Every day | ORAL | 2 refills | Status: DC

## 2020-04-28 MED ORDER — OMEPRAZOLE 20 MG PO CPDR: 20.0000 mg | DELAYED_RELEASE_CAPSULE | Freq: Every day | ORAL | 1 refills | Status: DC

## 2020-04-28 MED ORDER — ALBUTEROL SULFATE HFA 108 (90 BASE) MCG/ACT IN AERS: 2.0000 | INHALATION_SPRAY | Freq: Four times a day (QID) | RESPIRATORY_TRACT | 2 refills | Status: DC | PRN

## 2020-04-28 MED ORDER — LEVOTHYROXINE SODIUM 100 MCG PO TABS: ORAL_TABLET | ORAL | 3 refills | Status: DC

## 2020-04-28 MED ORDER — GABAPENTIN 400 MG PO CAPS: 400.0000 mg | ORAL_CAPSULE | Freq: Two times a day (BID) | ORAL | 2 refills | Status: DC

## 2020-04-28 MED ORDER — MONTELUKAST SODIUM 10 MG PO TABS: 10.0000 mg | ORAL_TABLET | Freq: Every day | ORAL | 1 refills | Status: DC

## 2020-04-28 MED ORDER — TRAZODONE HCL 50 MG PO TABS: 50.0000 mg | ORAL_TABLET | Freq: Every day | ORAL | 1 refills | Status: DC

## 2020-04-28 NOTE — Progress Notes (Signed)
[facility] [place]   Progress Note: General Provider: Mike Gip, FNP  SUBJECTIVE:   Amy Knapp is a 64 y.o. female who  has a past medical history of Allergy, Anxiety, Arthritis, Asthma, Broken leg (2007), Cataract, COPD (chronic obstructive pulmonary disease) (HCC), Depression, Diabetes mellitus without complication (HCC), Emphysema of lung (HCC), GERD (gastroesophageal reflux disease), Gout, Hyperlipidemia, Sleep apnea, and Thyroid disease.. Patient presents today for Follow-up (CT scan 3-4 weeks ago) and Leg Swelling (3 weeks) Patient states that she is down to smoking 3 cigarettes per day. Patient states that she is coughing up clear thick mucus. Patient states that she has had bilateral edema of the lower extremities. She does endorse standing for long hours at work. She does not take antihypertensives and does not add salt to her foods. She states that her wheezing is at baseline. She does not complain of chest pain or dizziness. She has not tried elevating her lower extremities for relief of edema. Patient states that she is on Advair 500/50, but it is not on her medication list.   Review of Systems  Constitutional: Negative.   HENT: Negative.   Eyes: Negative.   Respiratory: Positive for sputum production and wheezing.   Cardiovascular: Positive for leg swelling.  Gastrointestinal: Negative.   Genitourinary: Negative.   Musculoskeletal: Negative.   Skin: Negative.   Neurological: Negative.   Psychiatric/Behavioral: Negative.      OBJECTIVE: BP 127/75 (BP Location: Left Arm, Patient Position: Sitting, Cuff Size: Normal)   Pulse 96   Temp 97.8 F (36.6 C)   Resp 16   Ht 5\' 2"  (1.575 m)   Wt (!) 216 lb 1.6 oz (98 kg)   SpO2 92%   BMI 39.53 kg/m   Wt Readings from Last 3 Encounters:  04/28/20 (!) 216 lb 1.6 oz (98 kg)  02/25/20 216 lb (98 kg)  02/11/20 212 lb 6.4 oz (96.3 kg)     Physical Exam Vitals and nursing note reviewed.    Constitutional:      General: She is not in acute distress.    Appearance: She is well-developed.  HENT:     Head: Normocephalic and atraumatic.  Eyes:     Conjunctiva/sclera: Conjunctivae normal.     Pupils: Pupils are equal, round, and reactive to light.  Cardiovascular:     Rate and Rhythm: Normal rate and regular rhythm.     Heart sounds: Normal heart sounds.  Pulmonary:     Effort: Pulmonary effort is normal. No respiratory distress.     Breath sounds: Wheezing (bilateral lower expiratory) present.  Musculoskeletal:        General: Swelling (mild non pitting 1+ edema of the right lower extremity. ) present. Normal range of motion.     Cervical back: Normal range of motion.  Skin:    General: Skin is warm and dry.  Neurological:     Mental Status: She is alert and oriented to person, place, and time.  Psychiatric:        Mood and Affect: Mood normal.        Behavior: Behavior normal.        Thought Content: Thought content normal.        Judgment: Judgment normal.     ASSESSMENT/PLAN:   1. Chronic obstructive pulmonary disease with acute exacerbation (HCC) - albuterol (VENTOLIN HFA) 108 (90 Base) MCG/ACT inhaler; Inhale 2 puffs into the lungs every 6 (six) hours as needed for wheezing or shortness of breath.  Dispense: 54 g; Refill: 2 - montelukast (SINGULAIR) 10 MG tablet; Take 1 tablet (10 mg total) by mouth at bedtime.  Dispense: 30 tablet; Refill: 1  2. Type 2 diabetes mellitus with diabetic neuropathy, without long-term current use of insulin (HCC) - gabapentin (NEURONTIN) 400 MG capsule; Take 1 capsule (400 mg total) by mouth 2 (two) times daily.  Dispense: 60 capsule; Refill: 2  3. Insomnia, unspecified type - traZODone (DESYREL) 50 MG tablet; Take 1 tablet (50 mg total) by mouth at bedtime.  Dispense: 30 tablet; Refill: 1  4. Hyperlipidemia, unspecified hyperlipidemia type - atorvastatin (LIPITOR) 10 MG tablet; Take 1 tablet (10 mg total) by mouth daily.   Dispense: 30 tablet; Refill: 2 - Lipid Panel With LDL/HDL Ratio; Future  5. Hypothyroidism, unspecified type - levothyroxine (SYNTHROID) 100 MCG tablet; TAKE ONE TABLET BY MOUTH EVERY DAY BEFORE BREAKFAST.  Dispense: 90 tablet; Refill: 3 - Thyroid Panel With TSH; Future  6. Essential hypertension - CBC With Differential; Future - Comprehensive metabolic panel; Future Patient does not want to start diuretic at the present time. She will elevate lower extremities.   7. Tobacco use disorder Smoking cessation instruction/counseling given:  counseled patient on the dangers of tobacco use, advised patient to stop smoking, and reviewed strategies to maximize success   8. Gastroesophageal reflux disease, unspecified whether esophagitis present - omeprazole (PRILOSEC) 20 MG capsule; Take 1 capsule (20 mg total) by mouth daily.  Dispense: 30 capsule; Refill: 1    Return in about 3 months (around 07/29/2020), or labs one week prior, for DM2.    The patient was given clear instructions to go to ER or return to medical center if symptoms do not improve, worsen or new problems develop. The patient verbalized understanding and agreed with plan of care.   [name]. Amy Lam, FNP-BC [facility]

## 2020-04-28 NOTE — Patient Instructions (Signed)
Coping with Quitting Smoking  Quitting smoking is a physical and mental challenge. You will face cravings, withdrawal symptoms, and temptation. Before quitting, work with your health care provider to make a plan that can help you cope. Preparation can help you quit and keep you from giving in. How can I cope with cravings? Cravings usually last for 5-10 minutes. If you get through it, the craving will pass. Consider taking the following actions to help you cope with cravings:  Keep your mouth busy: ? Chew sugar-free gum. ? Suck on hard candies or a straw. ? Brush your teeth.  Keep your hands and body busy: ? Immediately change to a different activity when you feel a craving. ? Squeeze or play with a ball. ? Do an activity or a hobby, like making bead jewelry, practicing needlepoint, or working with wood. ? Mix up your normal routine. ? Take a short exercise break. Go for a quick walk or run up and down stairs. ? Spend time in public places where smoking is not allowed.  Focus on doing something kind or helpful for someone else.  Call a friend or family member to talk during a craving.  Join a support group.  Call a quit line, such as 1-800-QUIT-NOW.  Talk with your health care provider about medicines that might help you cope with cravings and make quitting easier for you. How can I deal with withdrawal symptoms? Your body may experience negative effects as it tries to get used to not having nicotine in the system. These effects are called withdrawal symptoms. They may include:  Feeling hungrier than normal.  Trouble concentrating.  Irritability.  Trouble sleeping.  Feeling depressed.  Restlessness and agitation.  Craving a cigarette. To manage withdrawal symptoms:  Avoid places, people, and activities that trigger your cravings.  Remember why you want to quit.  Get plenty of sleep.  Avoid coffee and other caffeinated drinks. These may worsen some of your  symptoms. How can I handle social situations? Social situations can be difficult when you are quitting smoking, especially in the first few weeks. To manage this, you can:  Avoid parties, bars, and other social situations where people might be smoking.  Avoid alcohol.  Leave right away if you have the urge to smoke.  Explain to your family and friends that you are quitting smoking. Ask for understanding and support.  Plan activities with friends or family where smoking is not an option. What are some ways I can cope with stress? Wanting to smoke may cause stress, and stress can make you want to smoke. Find ways to manage your stress. Relaxation techniques can help. For example:  Breathe slowly and deeply, in through your nose and out through your mouth.  Listen to soothing, relaxing music.  Talk with a family member or friend about your stress.  Light a candle.  Soak in a bath or take a shower.  Think about a peaceful place. What are some ways I can prevent weight gain? Be aware that many people gain weight after they quit smoking. However, not everyone does. To keep from gaining weight, have a plan in place before you quit and stick to the plan after you quit. Your plan should include:  Having healthy snacks. When you have a craving, it may help to: ? Eat plain popcorn, crunchy carrots, celery, or other cut vegetables. ? Chew sugar-free gum.  Changing how you eat: ? Eat small portion sizes at meals. ? Eat 4-6 small meals   throughout the day instead of 1-2 large meals a day. ? Be mindful when you eat. Do not watch television or do other things that might distract you as you eat.  Exercising regularly: ? Make time to exercise each day. If you do not have time for a long workout, do short bouts of exercise for 5-10 minutes several times a day. ? Do some form of strengthening exercise, like weight lifting, and some form of aerobic exercise, like running or swimming.  Drinking  plenty of water or other low-calorie or no-calorie drinks. Drink 6-8 glasses of water daily, or as much as instructed by your health care provider. Summary  Quitting smoking is a physical and mental challenge. You will face cravings, withdrawal symptoms, and temptation to smoke again. Preparation can help you as you go through these challenges.  You can cope with cravings by keeping your mouth busy (such as by chewing gum), keeping your body and hands busy, and making calls to family, friends, or a helpline for people who want to quit smoking.  You can cope with withdrawal symptoms by avoiding places where people smoke, avoiding drinks with caffeine, and getting plenty of rest.  Ask your health care provider about the different ways to prevent weight gain, avoid stress, and handle social situations. This information is not intended to replace advice given to you by your health care provider. Make sure you discuss any questions you have with your health care provider. Document Revised: 09/06/2017 Document Reviewed: 09/21/2016 Elsevier Patient Education  2020 Elsevier Inc.  

## 2020-05-03 ENCOUNTER — Telehealth: Payer: Self-pay | Admitting: Pharmacist

## 2020-05-03 NOTE — Telephone Encounter (Signed)
05/03/2020 10:36:08 AM - Advair & Ventolin refill online with [facility]  -- Amy Knapp - Tuesday, May 03, 2020 10:35 AM --Refilled Advair & Ventolin online with [facility], to ship 05/31/2020, order# [id number].

## 2020-05-17 ENCOUNTER — Other Ambulatory Visit: Payer: Self-pay

## 2020-05-17 ENCOUNTER — Ambulatory Visit: Payer: Self-pay | Admitting: Licensed Clinical Social Worker

## 2020-05-17 DIAGNOSIS — F4323 Adjustment disorder with mixed anxiety and depressed mood: Secondary | ICD-10-CM

## 2020-05-17 DIAGNOSIS — G4700 Insomnia, unspecified: Secondary | ICD-10-CM

## 2020-05-17 NOTE — BH Specialist Note (Signed)
Integrated Behavioral Health Follow Up Visit Via Phone  MRN: [id number] Name: Amy Knapp  Type of Service: Integrated Behavioral Health- Individual/Family Interpretor:No. Interpretor Name and Language: not applicable.   SUBJECTIVE: Amy Knapp is a 64 y.o. female accompanied by herself. Patient was referred by Hurman Horn NP for mental health. Patient reports the following symptoms/concerns: She discussed situational stressors with her daughter. She notes that she found a job [employer] for the last month on [employer]. She notes that she is agitated because of the situation with her daughter. She explains that her daughter constantly calls her when she is working and will not stop. She reports that her Trazodone is helping her to sleep but not with her anxiety. She asked if she could go on something additional for the agitation and anxiety. She notes that she has got a little bit more energy that she used to to have. She notes that she still attends church on Sunday's. She has been trying to crochet. She notes that she continues to experience nausea in the mornings but overall its gotten better. She denies suicidal and homicidal thoughts.  Duration of problem: ; Severity of problem: moderate  OBJECTIVE: Mood: Angry and Irritable and Affect: Appropriate Risk of harm to self or others: No plan to harm self or others  LIFE CONTEXT: Family and Social: see above. School/Work: see above.  Self-Care: see above. Life Changes: see above.   GOALS ADDRESSED: Patient will: 1.  Reduce symptoms of: stress  2.  Increase knowledge and/or ability of: stress reduction  3.  Demonstrate ability to: Increase healthy adjustment to current life circumstances  INTERVENTIONS: Interventions utilized:  Supportive Counseling was utilized by the clinician during today's follow up session. Clinician processed with the patient regarding how she has been doing since the last  follow up session. Clinician measured the patient's anxiety and depression on a numerical scale. Clinician utilized reflective listening encouraging the patient to ventilate her feelings towards her current situation. Clinician encouraged the patient to focus on what is in her control versus outside of her control. Clinician explained to the patient that she will have a case consultation with Dr. Mare Ferrari, MD, psychiatric consultant on Tuesday August 31st @ 9 am to discuss the option of placing her on something additional for her anxiety and agitation.  Standardized Assessments completed: GAD-7 and PHQ 9 GAD 7 21 PHQ 9 13 ASSESSMENT: Patient currently experiencing see above.   Patient may benefit from see above.  PLAN: 1. Follow up with behavioral health clinician on :  2. Behavioral recommendations: see above. 3. Referral(s): Integrated [facility] (In Clinic) 4. "From scale of 1-10, how likely are you to follow plan?":   [name], LCSW

## 2020-06-07 ENCOUNTER — Ambulatory Visit: Payer: Self-pay | Admitting: Licensed Clinical Social Worker

## 2020-06-22 ENCOUNTER — Telehealth: Payer: Self-pay | Admitting: Pharmacist

## 2020-06-22 NOTE — Telephone Encounter (Signed)
06/22/2020 1:27:48 PM - Call to [facility] on [facility] [employer]  -- Rhetta Mura - Wednesday, June 22, 2020 1:21 PM --Call to [facility] for status-faxed scrip 03/16/2020-spoke with Antony Odea verified that they received the script not sure why it was not processed--she stated the date on script over time limit-I would need to send a new script--I asked why when they received and they did not process-I asked could she not check with a supervisor or I speak with a supervisor-she stated the supervisor would state the same thing. I then asked for a supervisor, she took my name and phone number, I requested it they call and I am on my phone to leave me a name & number to call back-not main number. She then sent a message to her Lead Team member, they reviewed and told her they will process and to [facility] will process for shipping on Thursday over night, we should have by Friday 06/24/2020.

## 2020-06-28 ENCOUNTER — Ambulatory Visit: Payer: Self-pay | Admitting: Licensed Clinical Social Worker

## 2020-06-30 ENCOUNTER — Telehealth: Payer: Self-pay | Admitting: *Deleted

## 2020-06-30 NOTE — Telephone Encounter (Signed)
Patient notified that lung screening CT scan that was scheduled for next week is cancelled. Noted patient had CT chest in June of this year. Patient will be contacted to scheduled lung screening CT scan on or after June 24th 2022. Patient verbalizes understanding.

## 2020-07-06 ENCOUNTER — Ambulatory Visit: Payer: Self-pay | Admitting: Licensed Clinical Social Worker

## 2020-07-07 ENCOUNTER — Telehealth: Payer: Self-pay | Admitting: Licensed Clinical Social Worker

## 2020-07-07 NOTE — Telephone Encounter (Signed)
Patient missed a integrated behavioral health appointment with Rhett Bannister yesterday and social work intern was calling to reschedule her and provided her with clinic phone number.

## 2020-07-11 ENCOUNTER — Ambulatory Visit: Payer: Self-pay

## 2020-07-12 NOTE — Telephone Encounter (Signed)
Called patient and rescheduled her to see Rhett Bannister.

## 2020-07-26 ENCOUNTER — Other Ambulatory Visit: Payer: Self-pay

## 2020-07-28 ENCOUNTER — Other Ambulatory Visit: Payer: Self-pay

## 2020-07-28 DIAGNOSIS — E785 Hyperlipidemia, unspecified: Secondary | ICD-10-CM

## 2020-07-28 DIAGNOSIS — I10 Essential (primary) hypertension: Secondary | ICD-10-CM

## 2020-07-28 DIAGNOSIS — E039 Hypothyroidism, unspecified: Secondary | ICD-10-CM

## 2020-07-29 LAB — LIPID PANEL WITH LDL/HDL RATIO
Cholesterol, Total: 179 mg/dL (ref 100–199)
HDL: 46 mg/dL (ref >39)
LDL Chol Calc (NIH): 99 mg/dL (ref 0–99)
LDL/HDL Ratio: 2.2 ratio (ref 0.0–3.2)
Triglycerides: 199 mg/dL — ABNORMAL HIGH (ref 0–149)
VLDL Cholesterol Cal: 34 mg/dL (ref 5–40)

## 2020-07-29 LAB — COMPREHENSIVE METABOLIC PANEL
ALT: 37 IU/L — ABNORMAL HIGH (ref 0–32)
AST: 28 IU/L (ref 0–40)
Albumin/Globulin Ratio: 1.8 (ref 1.2–2.2)
Albumin: 4.4 g/dL (ref 3.8–4.8)
Alkaline Phosphatase: 108 IU/L (ref 44–121)
BUN/Creatinine Ratio: 7 — ABNORMAL LOW (ref 12–28)
BUN: 6 mg/dL — ABNORMAL LOW (ref 8–27)
Bilirubin Total: 0.3 mg/dL (ref 0.0–1.2)
CO2: 23 mmol/L (ref 20–29)
Calcium: 9.1 mg/dL (ref 8.7–10.3)
Chloride: 103 mmol/L (ref 96–106)
Creatinine, Ser: 0.87 mg/dL (ref 0.57–1.00)
GFR calc Af Amer: 83 mL/min/{1.73_m2} (ref >59)
GFR calc non Af Amer: 72 mL/min/{1.73_m2} (ref >59)
Globulin, Total: 2.4 g/dL (ref 1.5–4.5)
Glucose: 220 mg/dL — ABNORMAL HIGH (ref 65–99)
Potassium: 3.8 mmol/L (ref 3.5–5.2)
Sodium: 141 mmol/L (ref 134–144)
Total Protein: 6.8 g/dL (ref 6.0–8.5)

## 2020-07-29 LAB — CBC WITH DIFFERENTIAL
Basophils Absolute: 0 10*3/uL (ref 0.0–0.2)
Basos: 1 %
EOS (ABSOLUTE): 0.1 10*3/uL (ref 0.0–0.4)
Eos: 1 %
Hematocrit: 40.8 % (ref 34.0–46.6)
Hemoglobin: 13.9 g/dL (ref 11.1–15.9)
Immature Grans (Abs): 0 10*3/uL (ref 0.0–0.1)
Immature Granulocytes: 0 %
Lymphocytes Absolute: 2.4 10*3/uL (ref 0.7–3.1)
Lymphs: 30 %
MCH: 30 pg (ref 26.6–33.0)
MCHC: 34.1 g/dL (ref 31.5–35.7)
MCV: 88 fL (ref 79–97)
Monocytes Absolute: 0.7 10*3/uL (ref 0.1–0.9)
Monocytes: 8 %
Neutrophils Absolute: 4.8 10*3/uL (ref 1.4–7.0)
Neutrophils: 60 %
RBC: 4.63 x10E6/uL (ref 3.77–5.28)
RDW: 12.2 % (ref 11.7–15.4)
WBC: 8 10*3/uL (ref 3.4–10.8)

## 2020-07-29 LAB — THYROID PANEL WITH TSH
Free Thyroxine Index: 2 (ref 1.2–4.9)
T3 Uptake Ratio: 26 % (ref 24–39)
T4, Total: 7.5 ug/dL (ref 4.5–12.0)
TSH: 6.81 u[IU]/mL — ABNORMAL HIGH (ref 0.450–4.500)

## 2020-08-02 ENCOUNTER — Ambulatory Visit: Payer: Self-pay | Admitting: Licensed Clinical Social Worker

## 2020-08-02 ENCOUNTER — Other Ambulatory Visit: Payer: Self-pay

## 2020-08-02 ENCOUNTER — Ambulatory Visit: Payer: Self-pay | Admitting: Adult Health

## 2020-08-02 DIAGNOSIS — F4323 Adjustment disorder with mixed anxiety and depressed mood: Secondary | ICD-10-CM

## 2020-08-02 NOTE — BH Specialist Note (Signed)
Integrated Behavioral Health Follow Up Visit  MRN: [id number] Name: Amy Knapp    Type of Service: Integrated Behavioral Health- Individual/Family Interpretor:No. Interpretor Name and Language: none  SUBJECTIVE: Amy Knapp is a 64 y.o. female accompanied by herself Patient was referred by Hurman Horn, NP  for mental health . Patient reports the following symptoms/concerns: The patient reports that she got hired on with the company she has been working for and is working a lot of overtime due to [employer]. She states that this is a good thing but that it makes it hard for her to get to her doctor's appointments. She discussed her current health stressors, and asked that the clinician cancel her 5:30 appointment at the [facility] with the NP because she was at work and just found out that she has to work very late today.The patient discussed her health and other stressors that are causing her some concern. The patient stated that she is compliant with taking her medications and feels that Trazodone is working well to help her sleep.The patient denied any suicidal or homicidal thoughts.      Duration of problem:; Severity of problem: moderate  OBJECTIVE: Mood: Euthymic and Affect: Appropriate Risk of harm to self or others: No plan to harm self or others  LIFE CONTEXT: Family and Social: see above  School/Work: see above Self-Care: see above Life Changes: see above  GOALS ADDRESSED: Patient will: 1.  Reduce symptoms of: anxiety, depression and stress  2.  Increase knowledge and/or ability of: coping skills, self-management skills and stress reduction  3.  Demonstrate ability to: Increase healthy adjustment to current life circumstances  INTERVENTIONS: Interventions utilized:  Supportive Counseling was utilized during today's follow up session. The clinician processed wit the patient how she has been doing since her last follow up session. The clinician  measured the patient's anxiety and depression on a numerical scale. The clinician provides a space for the patient to ventilate her frustrations concerning her current life circumstances. The clinician offered praise to the patient for doing so well and congratulated her on getting the job she has worked so hard for. The clinician encouraged the patient to keep using her coping skills to deal with the stressors in her life. Standardized Assessments completed: GAD-7 and PHQ 9  GAD-7   16 PHQ-9  8  ASSESSMENT: Patient currently experiencing see above   Patient may benefit from see above  PLAN: 1. Follow up with behavioral health clinician on : 08/23/2020 at 6:00 PM  2. Behavioral recommendations:  3. Referral(s): Integrated [facility] (In Clinic) 4. "From scale of 1-10, how likely are you to follow plan?":   Judith Part, Student-Social Work

## 2020-08-04 ENCOUNTER — Telehealth: Payer: Self-pay | Admitting: Adult Health

## 2020-08-04 NOTE — Telephone Encounter (Signed)
lmom for pt to return call to office to r/s office visit per 10/26 staff msg

## 2020-08-09 ENCOUNTER — Other Ambulatory Visit: Payer: Self-pay

## 2020-08-09 ENCOUNTER — Ambulatory Visit: Payer: Self-pay | Admitting: Gerontology

## 2020-08-09 ENCOUNTER — Other Ambulatory Visit: Payer: Self-pay | Admitting: Gerontology

## 2020-08-09 VITALS — BP 122/67 | HR 84 | Wt 208.0 lb

## 2020-08-09 DIAGNOSIS — E039 Hypothyroidism, unspecified: Secondary | ICD-10-CM

## 2020-08-09 DIAGNOSIS — K219 Gastro-esophageal reflux disease without esophagitis: Secondary | ICD-10-CM

## 2020-08-09 DIAGNOSIS — E785 Hyperlipidemia, unspecified: Secondary | ICD-10-CM

## 2020-08-09 DIAGNOSIS — L729 Follicular cyst of the skin and subcutaneous tissue, unspecified: Secondary | ICD-10-CM

## 2020-08-09 DIAGNOSIS — J441 Chronic obstructive pulmonary disease with (acute) exacerbation: Secondary | ICD-10-CM

## 2020-08-09 DIAGNOSIS — G4700 Insomnia, unspecified: Secondary | ICD-10-CM

## 2020-08-09 DIAGNOSIS — M109 Gout, unspecified: Secondary | ICD-10-CM

## 2020-08-09 DIAGNOSIS — E1140 Type 2 diabetes mellitus with diabetic neuropathy, unspecified: Secondary | ICD-10-CM

## 2020-08-09 MED ORDER — OMEPRAZOLE 20 MG PO CPDR: 20.0000 mg | DELAYED_RELEASE_CAPSULE | Freq: Every day | ORAL | 1 refills | Status: DC

## 2020-08-09 MED ORDER — ATORVASTATIN CALCIUM 10 MG PO TABS: 10.0000 mg | ORAL_TABLET | Freq: Every day | ORAL | 2 refills | Status: DC

## 2020-08-09 MED ORDER — ALBUTEROL SULFATE HFA 108 (90 BASE) MCG/ACT IN AERS: 2.0000 | INHALATION_SPRAY | Freq: Four times a day (QID) | RESPIRATORY_TRACT | 2 refills | Status: DC | PRN

## 2020-08-09 MED ORDER — TRAZODONE HCL 50 MG PO TABS: 50.0000 mg | ORAL_TABLET | Freq: Every day | ORAL | 1 refills | Status: DC

## 2020-08-09 MED ORDER — GABAPENTIN 400 MG PO CAPS: 400.0000 mg | ORAL_CAPSULE | Freq: Two times a day (BID) | ORAL | 2 refills | Status: DC

## 2020-08-09 MED ORDER — LEVOTHYROXINE SODIUM 100 MCG PO TABS: ORAL_TABLET | ORAL | 3 refills | Status: DC

## 2020-08-09 NOTE — Progress Notes (Signed)
[facility]   Progress Note: General Provider: Regino Bellow, NP  SUBJECTIVE:   Amy Knapp is a 64 y.o. female who  has a past medical history of Allergy, Anxiety, Arthritis, Asthma, Broken leg (2007), Cataract, COPD (chronic obstructive pulmonary disease) (HCC), Depression, Diabetes mellitus without complication (HCC), Emphysema of lung (HCC), GERD (gastroesophageal reflux disease), Gout, Hyperlipidemia, Sleep apnea, and Thyroid disease.. Patient presents today for medication refill and follow up of Hypothyroidism and Diabetes She reports that she takes Synthroid every day, 30 minutes before breakfast. She denies feeling fatigue, cold intolerance, weight gain, constipation, or dry skin.  She reports checking her blood glucose every afternoon but didn't bring a glucose log. She reports that her blood glucose runs between 112 to 377 mg/dl. She is complaint with her glipizide and follows a low carb/a low concentrated sweets diet and daily exercise. She denies hypo/hyperglycemia symptoms and does daily foot checks. She complains of a small cyst on her upper mid-back that drains a small amount of  clear drainage x 3 weeks, denies pain or fever.  She states that she is down to 1-2 cigarettes a day. Overall she is doing well and offers no further complaints.    Review of Systems  Constitutional: Negative.   HENT: Negative.   Eyes: Negative.   Respiratory: Positive for cough (intermittent).   Cardiovascular: Negative.   Gastrointestinal: Negative.   Genitourinary: Negative.   Musculoskeletal: Negative.   Skin:       States she has a small cyst to mid upper back that has been draining a small amount of clear fluid for the past 3 wks  Neurological: Negative.   Psychiatric/Behavioral: Negative.      OBJECTIVE: BP 122/67    Pulse 84    Wt 208 lb (94.3 kg)    SpO2 94%    BMI 38.04 kg/m   Wt Readings from Last 3 Encounters:  08/09/20 208 lb (94.3 kg)  04/28/20  (!) 216 lb 1.6 oz (98 kg)  02/25/20 216 lb (98 kg)     Physical Exam Vitals reviewed.  Constitutional:      Appearance: Normal appearance.  Cardiovascular:     Rate and Rhythm: Normal rate and regular rhythm.     Pulses: Normal pulses.     Heart sounds: Normal heart sounds.  Pulmonary:     Effort: Pulmonary effort is normal.     Breath sounds: Normal breath sounds.  Abdominal:     General: Bowel sounds are normal.     Palpations: Abdomen is soft.  Musculoskeletal:        General: Normal range of motion.     Cervical back: Normal range of motion.  Skin:    Comments: Pinpoint size cycst to upper mid back, no erythema or edema surrounding cyst.  Moderate amount of clear fluid drained with mild pressure.  Neurological:     General: No focal deficit present.     Mental Status: She is alert.  Psychiatric:        Mood and Affect: Mood normal.     ASSESSMENT/PLAN: 1. Chronic obstructive pulmonary disease with acute exacerbation (HCC) -Continue current treatment - albuterol (VENTOLIN HFA) 108 (90 Base) MCG/ACT inhaler; Inhale 2 puffs into the lungs every 6 (six) hours as needed for wheezing or shortness of breath.  Dispense: 54 g; Refill: 2  2. Hyperlipidemia, unspecified hyperlipidemia type -Continue current treatment -Advise to follow DASH diet and regular exercise as tolerated.  - atorvastatin (LIPITOR) 10  MG tablet; Take 1 tablet (10 mg total) by mouth daily.  Dispense: 30 tablet; Refill: 2  4. Type 2 diabetes mellitus with diabetic neuropathy, without long-term current use of insulin (HCC) - No medication changes warranted at the present time -Advised to check fasting blood glucose and two hours after meal and to bring log to the next appointment. -Advised to continue on low carb/a low concentrated sweets diet and exercise as tolerated.   -Advised daily foot checks - Future HgB A1c - Glipizide 5 mg daily - gabapentin (NEURONTIN) 400 MG capsule; Take 1 capsule (400 mg  total) by mouth 2 (two) times daily.  Dispense: 60 capsule; Refill: 2  5. Hypothyroidism, unspecified type - Take levothyroxine 100 mcg at the same time everyday on an empty stomach and wait 45 minutes  to one hour before eating. - levothyroxine (SYNTHROID) 100 MCG tablet; TAKE ONE TABLET BY MOUTH EVERY DAY BEFORE BREAKFAST.  Dispense: 90 tablet; Refill: 3 - Future TSH  6. Gastroesophageal reflux disease, unspecified whether esophagitis present -Continue current treatment  - omeprazole (PRILOSEC) 20 MG capsule; Take 1 capsule (20 mg total) by mouth daily.  Dispense: 30 capsule; Refill: 1  7. Insomnia, unspecified type -Continue current treatment - traZODone (DESYREL) 50 MG tablet; Take 1 tablet (50 mg total) by mouth at bedtime.  Dispense: 30 tablet; Refill: 1  8. Cyst of skin  - Advised to apply warm compress for  About 15 minutes at a time  - Advised to keep skin clean and dry and to avoid tight clothing -Advised to contact the clinic if the cyst becomes painful, inflamed, or change in drainage color.  Return in about 2 weeks (around 08/23/2020), or if symptoms worsen or fail to improve, for TSH and Hgb A1c  lab review .    The patient was given clear instructions to go to ER or return to medical center if symptoms do not improve, worsen or new problems develop. The patient verbalized understanding and agreed with plan of care.  Amy Knapp, AGNP [facility]

## 2020-08-09 NOTE — Patient Instructions (Signed)
Diabetes Basics  Diabetes (diabetes mellitus) is a long-term (chronic) disease. It occurs when the body does not properly use sugar (glucose) that is released from food after you eat. Diabetes may be caused by one or both of these problems:  Your pancreas does not make enough of a hormone called insulin.  Your body does not react in a normal way to insulin that it makes. Insulin lets sugars (glucose) go into cells in your body. This gives you energy. If you have diabetes, sugars cannot get into cells. This causes high blood sugar (hyperglycemia). Follow these instructions at home: How is diabetes treated? You may need to take insulin or other diabetes medicines daily to keep your blood sugar in balance. Take your diabetes medicines every day as told by your doctor. List your diabetes medicines here: Diabetes medicines  Name of medicine: ______________________________ ? Amount (dose): _______________ Time (a.m./p.m.): _______________ Notes: ___________________________________  Name of medicine: ______________________________ ? Amount (dose): _______________ Time (a.m./p.m.): _______________ Notes: ___________________________________  Name of medicine: ______________________________ ? Amount (dose): _______________ Time (a.m./p.m.): _______________ Notes: ___________________________________ If you use insulin, you will learn how to give yourself insulin by injection. You may need to adjust the amount based on the food that you eat. List the types of insulin you use here: Insulin  Insulin type: ______________________________ ? Amount (dose): _______________ Time (a.m./p.m.): _______________ Notes: ___________________________________  Insulin type: ______________________________ ? Amount (dose): _______________ Time (a.m./p.m.): _______________ Notes: ___________________________________  Insulin type: ______________________________ ? Amount (dose): _______________ Time (a.m./p.m.):  _______________ Notes: ___________________________________  Insulin type: ______________________________ ? Amount (dose): _______________ Time (a.m./p.m.): _______________ Notes: ___________________________________  Insulin type: ______________________________ ? Amount (dose): _______________ Time (a.m./p.m.): _______________ Notes: ___________________________________ How do I manage my blood sugar?  Check your blood sugar levels using a blood glucose monitor as directed by your doctor. Your doctor will set treatment goals for you. Generally, you should have these blood sugar levels:  Before meals (preprandial): 80-130 mg/dL (4.4-7.2 mmol/L).  After meals (postprandial): below 180 mg/dL (10 mmol/L).  A1c level: less than 7%. Write down the times that you will check your blood sugar levels: Blood sugar checks  Time: _______________ Notes: ___________________________________  Time: _______________ Notes: ___________________________________  Time: _______________ Notes: ___________________________________  Time: _______________ Notes: ___________________________________  Time: _______________ Notes: ___________________________________  Time: _______________ Notes: ___________________________________  What do I need to know about low blood sugar? Low blood sugar is called hypoglycemia. This is when blood sugar is at or below 70 mg/dL (3.9 mmol/L). Symptoms may include:  Feeling: ? Hungry. ? Worried or nervous (anxious). ? Sweaty and clammy. ? Confused. ? Dizzy. ? Sleepy. ? Sick to your stomach (nauseous).  Having: ? A fast heartbeat. ? A headache. ? A change in your vision. ? Tingling or no feeling (numbness) around the mouth, lips, or tongue. ? Jerky movements that you cannot control (seizure).  Having trouble with: ? Moving (coordination). ? Sleeping. ? Passing out (fainting). ? Getting upset easily (irritability). Treating low blood sugar To treat low blood  sugar, eat or drink something sugary right away. If you can think clearly and swallow safely, follow the 15:15 rule:  Take 15 grams of a fast-acting carb (carbohydrate). Talk with your doctor about how much you should take.  Some fast-acting carbs are: ? Sugar tablets (glucose pills). Take 3-4 glucose pills. ? 6-8 pieces of hard candy. ? 4-6 oz (120-150 mL) of fruit juice. ? 4-6 oz (120-150 mL) of regular (not diet) soda. ? 1 Tbsp (15 mL) honey or sugar.    Check your blood sugar 15 minutes after you take the carb.  If your blood sugar is still at or below 70 mg/dL (3.9 mmol/L), take 15 grams of a carb again.  If your blood sugar does not go above 70 mg/dL (3.9 mmol/L) after 3 tries, get help right away.  After your blood sugar goes back to normal, eat a meal or a snack within 1 hour. Treating very low blood sugar If your blood sugar is at or below 54 mg/dL (3 mmol/L), you have very low blood sugar (severe hypoglycemia). This is an emergency. Do not wait to see if the symptoms will go away. Get medical help right away. Call your local emergency services (911 in the [place] Do not drive yourself to the hospital. Questions to ask your health care provider  Do I need to meet with a diabetes educator?  What equipment will I need to care for myself at home?  What diabetes medicines do I need? When should I take them?  How often do I need to check my blood sugar?  What number can I call if I have questions?  When is my next doctor's visit?  Where can I find a support group for people with diabetes? Where to find more information  American Diabetes Association: www.diabetes.org  American Association of Diabetes Educators: www.diabeteseducator.org/patient-resources Contact a doctor if:  Your blood sugar is at or above 240 mg/dL (16.6 mmol/L) for 2 days in a row.  You have been sick or have had a fever for 2 days or more, and you are not getting better.  You have any of these  problems for more than 6 hours: ? You cannot eat or drink. ? You feel sick to your stomach (nauseous). ? You throw up (vomit). ? You have watery poop (diarrhea). Get help right away if:  Your blood sugar is lower than 54 mg/dL (3 mmol/L).  You get confused.  You have trouble: ? Thinking clearly. ? Breathing. Summary  Diabetes (diabetes mellitus) is a long-term (chronic) disease. It occurs when the body does not properly use sugar (glucose) that is released from food after digestion.  Take insulin and diabetes medicines as told.  Check your blood sugar every day, as often as told.  Keep all follow-up visits as told by your doctor. This is important. This information is not intended to replace advice given to you by your health care provider. Make sure you discuss any questions you have with your health care provider. Document Revised: 06/17/2019 Document Reviewed: 12/27/2017 Elsevier Patient Education  2020 [employer]. Levothyroxine tablets What is this medicine? LEVOTHYROXINE (lee voe thye ROX een) is a thyroid hormone. This medicine can improve symptoms of thyroid deficiency such as slow speech, lack of energy, weight gain, hair loss, dry skin, and feeling cold. It also helps to treat goiter (an enlarged thyroid gland). It is also used to treat some kinds of thyroid cancer along with surgery and other medicines. This medicine may be used for other purposes; ask your health care provider or pharmacist if you have questions. COMMON BRAND NAME(S): Estre, Euthyrox, Levo-T, Levothroid, Levoxyl, Synthroid, Thyro-Tabs, Unithroid What should I tell my health care provider before I take this medicine? They need to know if you have any of these conditions:  Addison's disease or other adrenal gland problem  angina  bone problems  diabetes  dieting or on a weight loss program  fertility problems  heart disease  pituitary gland problem  take medicines that treat  or prevent  blood clots  an unusual or allergic reaction to levothyroxine, thyroid hormones, other medicines, foods, dyes, or preservatives  pregnant or trying to get pregnant  breast-feeding How should I use this medicine? Take this medicine by mouth with plenty of water. It is best to take on an empty stomach, at least 30 minutes to one hour before breakfast. Avoid taking antacids containing aluminum or magnesium, simethicone, bile acid sequestrants, calcium carbonate, sodium polystyrene sulfonate, ferrous sulfate, sevelamer, lanthanum, or sucralfate within 4 hours of taking this medicine. Follow the directions on the prescription label. Take at the same time each day. Do not take your medicine more often than directed. Contact your pediatrician regarding the use of this medicine in children. While this drug may be prescribed for children and infants as young as a few days of age for selected conditions, precautions do apply. For infants, you may crush the tablet and place in a small amount of (5 to 10 mL or 1 to 2 teaspoonfuls) of water, breast milk, or non-soy based infant formula. Do not mix with soy-based infant formula. Give as directed. Overdosage: If you think you have taken too much of this medicine contact a poison control center or emergency room at once. NOTE: This medicine is only for you. Do not share this medicine with others. What if I miss a dose? If you miss a dose, take it as soon as you can. If it is almost time for your next dose, take only that dose. Do not take double or extra doses. What may interact with this medicine?  amiodarone  antacids  anti-thyroid medicines  calcium supplements  carbamazepine  certain medicines for depression  certain medicines to treat cancer  cholestyramine  clofibrate  colesevelam  colestipol  digoxin  female hormones, like estrogens and birth control pills, patches, rings, or injections  iron  supplements  ketamine  lanthanum  liquid nutrition products like Ensure  lithium  medicines for colds and breathing difficulties  medicines for diabetes  medicines or dietary supplements for weight loss  methadone  niacin  orlistat  oxandrolone  phenobarbital or other barbiturates  phenytoin  rifampin  sevelamer  simethicone  sodium polystyrene sulfonate  soy isoflavones  steroid medicines like prednisone or cortisone  sucralfate  testosterone  theophylline  warfarin This list may not describe all possible interactions. Give your health care provider a list of all the medicines, herbs, non-prescription drugs, or dietary supplements you use. Also tell them if you smoke, drink alcohol, or use illegal drugs. Some items may interact with your medicine. What should I watch for while using this medicine? Be sure to take this medicine with plenty of fluids. Some tablets may cause choking, gagging, or difficulty swallowing from the tablet getting stuck in your throat. Most of these problems disappear if the medicine is taken with the right amount of water or other fluids. Do not switch brands of this medicine unless your health care professional agrees with the change. Ask questions if you are uncertain. You will need regular exams and occasional blood tests to check the response to treatment. If you are receiving this medicine for an underactive thyroid, it may be several weeks before you notice an improvement. Check with your doctor or health care professional if your symptoms do not improve. It may be necessary for you to take this medicine for the rest of your life. Do not stop using this medicine unless your doctor or health care professional advises you to.  This medicine can affect blood sugar levels. If you have diabetes, check your blood sugar as directed. You may lose some of your hair when you first start treatment. With time, this usually corrects itself. If  you are going to have surgery, tell your doctor or health care professional that you are taking this medicine. What side effects may I notice from receiving this medicine? Side effects that you should report to your doctor or health care professional as soon as possible:  allergic reactions like skin rash, itching or hives, swelling of the face, lips, or tongue  anxious  breathing problems  changes in menstrual periods  chest pain  diarrhea  excessive sweating or intolerance to heat  fast or irregular heartbeat  leg cramps  nervousness  swelling of ankles, feet, or legs  tremors  trouble sleeping  vomiting Side effects that usually do not require medical attention (report to your doctor or health care professional if they continue or are bothersome):  changes in appetite  headache  irritable  nausea  weight loss This list may not describe all possible side effects. Call your doctor for medical advice about side effects. You may report side effects to FDA at 1-800-FDA-1088. Where should I keep my medicine? Keep out of the reach of children. Store at room temperature between 15 and 30 degrees C (59 and 86 degrees F). Protect from light and moisture. Keep container tightly closed. Throw away any unused medicine after the expiration date. NOTE: This sheet is a summary. It may not cover all possible information. If you have questions about this medicine, talk to your doctor, pharmacist, or health care provider.  2020 Elsevier/Gold Standard (2019-05-01 15:09:06)

## 2020-08-10 ENCOUNTER — Other Ambulatory Visit: Payer: Self-pay | Admitting: Gerontology

## 2020-08-10 DIAGNOSIS — J441 Chronic obstructive pulmonary disease with (acute) exacerbation: Secondary | ICD-10-CM

## 2020-08-10 MED ORDER — TRELEGY ELLIPTA 100-62.5-25 MCG/INH IN AEPB: 1.0000 | INHALATION_SPRAY | Freq: Every day | RESPIRATORY_TRACT | 3 refills | Status: DC

## 2020-08-23 ENCOUNTER — Other Ambulatory Visit: Payer: Self-pay

## 2020-08-23 ENCOUNTER — Ambulatory Visit: Payer: Self-pay | Admitting: Licensed Clinical Social Worker

## 2020-08-23 DIAGNOSIS — F4323 Adjustment disorder with mixed anxiety and depressed mood: Secondary | ICD-10-CM

## 2020-08-23 DIAGNOSIS — E039 Hypothyroidism, unspecified: Secondary | ICD-10-CM

## 2020-08-23 DIAGNOSIS — E1140 Type 2 diabetes mellitus with diabetic neuropathy, unspecified: Secondary | ICD-10-CM

## 2020-08-23 NOTE — BH Specialist Note (Signed)
Integrated Behavioral Health Follow Up Visit  MRN: [id number] Name: Amy Knapp   Type of Service: Integrated Behavioral Health- Individual/Family Interpretor:No. Interpretor Name and Language: none   SUBJECTIVE: Amy Knapp is a 64 y.o. female accompanied by herself  Patient was referred by Hurman Horn, NP  for mental health . Patient reports the following symptoms/concerns: The patient stated that she has been having trouble sleeping because her neighbors are keeping her up all night stomping up and down stairs and making noise all night long. The patient stated that she is exhausted and will have worked 17 straight days in a row by the time she gets to her Thanksgiving Day off.  She states that she has tried to talk nicely with her neighbors landlord but that hasn't worked so now she is done being nice. She plans on moving after January because [employer] will not help her with her rent because she went to work and because the landlord wont make the neighbors be quiet. The patient stated that she is agitated and feels irritable all the time and wore out. She notes that she is not cooking for Thanksgiving but will be going out to eat with her family instead so she can rest over her break. The patient denied suicidal or homicidal thoughts.  Duration of problem: ; Severity of problem: moderate  OBJECTIVE: Mood: Euthymic and Affect: Appropriate Risk of harm to self or others: No plan to harm self or others  LIFE CONTEXT: Family and Social: see above School/Work: see above Self-Care: see above Life Changes: see above  GOALS ADDRESSED: Patient will: 1.  Reduce symptoms of: anxiety, depression and stress  2.  Increase knowledge and/or ability of: coping skills, healthy habits and stress reduction  3.  Demonstrate ability to: Increase healthy adjustment to current life circumstances  INTERVENTIONS: Interventions utilized:  Brief CBT was utilized by the  clinician during today's follow up session. Clinician processed with the patient regarding how she has been doing since the last follow up session. Clinician measured the patient's anxiety and depression on a numerical scale.The clinician provided a space for the patient to ventilate her frustrations regarding her current life circumstances. Clinician discussed relation techniques and went through deep breathing exercises with the patient. The clinician encouraged the patient to continue to take her medication as prescribed in order for it to reach its full intended effect.  Standardized Assessments completed: GAD-7 and PHQ 9  PHQ-9 11 GAD-7 15  ASSESSMENT: Patient currently experiencing see above  Patient may benefit from  See above.  PLAN: 1. Follow up with behavioral health clinician on : 12/14/ 21 at 6:00 PM  2. Behavioral recommendations:  3. Referral(s): Integrated [facility] (In Clinic) 4. "From scale of 1-10, how likely are you to follow plan?":  Judith Part, Student-Social Work

## 2020-08-24 LAB — TSH: TSH: 11.2 u[IU]/mL — ABNORMAL HIGH (ref 0.450–4.500)

## 2020-08-24 LAB — HEMOGLOBIN A1C
Est. average glucose Bld gHb Est-mCnc: 223 mg/dL
Hgb A1c MFr Bld: 9.4 % — ABNORMAL HIGH (ref 4.8–5.6)

## 2020-08-25 ENCOUNTER — Other Ambulatory Visit: Payer: Self-pay

## 2020-08-25 ENCOUNTER — Ambulatory Visit: Payer: Self-pay | Admitting: Gerontology

## 2020-08-25 VITALS — BP 134/82 | HR 79 | Temp 97.7°F | Resp 17 | Ht 62.5 in | Wt 203.4 lb

## 2020-08-25 DIAGNOSIS — R1310 Dysphagia, unspecified: Secondary | ICD-10-CM

## 2020-08-25 DIAGNOSIS — E039 Hypothyroidism, unspecified: Secondary | ICD-10-CM

## 2020-08-25 DIAGNOSIS — R918 Other nonspecific abnormal finding of lung field: Secondary | ICD-10-CM

## 2020-08-25 MED ORDER — LEVOTHYROXINE SODIUM 112 MCG PO TABS: ORAL_TABLET | ORAL | 0 refills | Status: DC

## 2020-08-25 NOTE — Progress Notes (Signed)
[facility]   Progress Note: General Provider: Regino Bellow, NP  SUBJECTIVE:   Amy Knapp is a 64 y.o. female who  has a past medical history of Allergy, Anxiety, Arthritis, Asthma, Broken leg (2007), Cataract, COPD (chronic obstructive pulmonary disease) (HCC), Depression, Diabetes mellitus without complication (HCC), Emphysema of lung (HCC), GERD (gastroesophageal reflux disease), Gout, Hyperlipidemia, Sleep apnea, and Thyroid disease. Patient presents today for lab review  TSH that was done on 08/23/2020 elevated to 11.200 uIU/mL from 6.810 uIU/mL. She states that she takes levothyroxine regularly 45 minutes before breakfast, and denies taking multivitamins or biotin. She states that she felt heart palpitation for the past two days that lasted about 10 minutes each time. She denies chest pain,fatigue, Increased sensitivity to cold, constipation, dry skin, weight gain, or muscle weakness. Hgb A1c that was done on 08/23/2020 increased to 9.4% from 7.7%. She reports checking her blood glucose twice a day but didn't bring a glucose log. She states that her blood glucose runs between 117 mg/dl to 841 mg/dl, blood glucose at the clinic was 129 mg/dl. She declines adjustment of hyperglycemia medications, and additionally, she endorses a history of severe diarrhea with metformin. She states that she will call the clinic if her blood glucose is above 180 mg/dl. She reports adhering to medication regimen and low carb/a low concentrated sweets diet. She denies hypo/hyperglycemia symptoms and does daily foot checks.  She reports a concern on her chest CT result. CT chest was done on 03/31/2020, impression of CT chest indicates: 1. No specific findings to explain hemoptysis. 2. Emphysema (ICD10-J43.9). 3. Diffuse bilateral bronchial wall thickening, consistent with nonspecific infectious or inflammatory bronchitis. 4. Occasional small pulmonary nodules measuring 5 mm or  smaller. No follow-up is needed if a patient is low-risk (and has no known or suspected primary neoplasm). Non-contrast chest CT can be considered in 12 months if a patient is high-risk.  She smokes one and a half cigarettes a day and doesn't have a plan to quit smoking. She denies shortness of breath or wheezing.  She reports progressive dysphagia with solid foods and recently with liquids; she couldn't verify the start of dysphagia. She also reports tenderness to the anterior neck area. She states that she takes extra effort and time to chew and swallow. She denies choking during or after meals. Overall she states that she is doing well and offers no further complaints.  Review of Systems  Constitutional: Negative.   HENT: Negative.   Eyes: Negative.   Respiratory: Negative.   Cardiovascular: Positive for palpitations (states she had it two days ago x10 minutes).  Gastrointestinal: Negative.   Genitourinary: Negative.   Musculoskeletal: Negative.   Skin: Negative.   Neurological: Positive for tingling (finget tips and toes).  Endo/Heme/Allergies: Negative.   Psychiatric/Behavioral: Negative.      OBJECTIVE: BP 134/82 (BP Location: Left Arm, Patient Position: Sitting, Cuff Size: Large)    Pulse 79    Temp 97.7 F (36.5 C)    Resp 17    Ht 5' 2.5" (1.588 m)    Wt 203 lb 6.4 oz (92.3 kg)    SpO2 94%    BMI 36.61 kg/m   Wt Readings from Last 3 Encounters:  08/25/20 203 lb 6.4 oz (92.3 kg)  08/09/20 208 lb (94.3 kg)  04/28/20 (!) 216 lb 1.6 oz (98 kg)     Physical Exam Vitals reviewed.  Constitutional:      Appearance: Normal appearance.  HENT:  Head: Normocephalic.  Neck:     Thyroid: Thyroid tenderness present.  Cardiovascular:     Rate and Rhythm: Normal rate and regular rhythm.     Pulses: Normal pulses.     Heart sounds: Normal heart sounds.  Pulmonary:     Effort: Pulmonary effort is normal.  Abdominal:     General: Bowel sounds are normal.     Palpations: Abdomen  is soft.  Musculoskeletal:        General: Normal range of motion.     Cervical back: Normal range of motion.  Skin:    General: Skin is warm and dry.  Neurological:     General: No focal deficit present.     Mental Status: She is alert and oriented to person, place, and time.  Psychiatric:        Mood and Affect: Mood normal.        Behavior: Behavior normal.     ASSESSMENT/PLAN:   1. Hypothyroidism, unspecified type -Dose increased due to elevated TSH - levothyroxine (SYNTHROID) 112 MCG tablet; TAKE ONE TABLET BY MOUTH EVERY DAY BEFORE BREAKFAST.  Dispense: 1 tablet; Refill: 0 -Will check  TSH in 6 weeks -Advised to take levothyroxine with empty stomach 1 hour before breakfast.   2. Dysphagia, unspecified type  -Advised to sit upright at 90 degrees when eating and drinking, Do not eat or drink when slouched or lying down.  -Advised to take small bites of food, to take small sips of fluid, to eat slowly. -Advised to chew foods well before swallowing. - US THYROID; Future  3. Pulmonary nodules -Discussed diseases and conditions associated with smoking. -Advised to use quite smoking products - Ambulatory referral to Pulmonology  4. Diabetes mellitus without complication (HCC) - Not controled Hgb A1c is 9.4% and your goal should be less than 7%. -check fasting blood glucose and two hours after meal and to bring blood glucose log to the next appointment. -Advised to contact the clinic if fasting blood glucose is greater than 130 mg/dl or two hours after meal blood glucose greater than 180 mg/dl. - If fasting blood glucose is greater than 130 mg/dl or 2 hours after meal blood glucose greater than 180 mg/dl , consider increasing glipizide to 10 mg or start DPP-4 inhibitors such as Januvia since it has a low risk of hypoglycemia.  -Advised to continue on low carb/a low concentrated sweets diet and exercise as tolerated - Advised to continue daily foot check. -Continue glipizide 5  mg daily.    Return in about 6 weeks (around 10/06/2020), or if symptoms worsen or fail to improve, for TSH check.    The patient was given clear instructions to go to ER or return to medical center if symptoms do not improve, worsen or new problems develop. The patient verbalized understanding and agreed with plan of care.  Jadden Yim, AGNP [facility]

## 2020-08-25 NOTE — Patient Instructions (Signed)

## 2020-09-13 ENCOUNTER — Other Ambulatory Visit: Payer: Self-pay | Admitting: Gerontology

## 2020-09-13 DIAGNOSIS — G4700 Insomnia, unspecified: Secondary | ICD-10-CM

## 2020-09-13 MED ORDER — TRAZODONE HCL 50 MG PO TABS: 50.0000 mg | ORAL_TABLET | Freq: Every day | ORAL | 1 refills | Status: DC

## 2020-09-20 ENCOUNTER — Other Ambulatory Visit: Payer: Self-pay

## 2020-09-20 ENCOUNTER — Ambulatory Visit: Payer: Self-pay | Admitting: Licensed Clinical Social Worker

## 2020-09-20 DIAGNOSIS — F4323 Adjustment disorder with mixed anxiety and depressed mood: Secondary | ICD-10-CM

## 2020-09-20 NOTE — BH Specialist Note (Signed)
Integrated Behavioral Health Follow Up In-Person Visit  MRN: [id number] Name: Amy Knapp   Total time: 30 minutes  Types of Service: General Behavioral Integrated Care (BHI)  Interpretor:No. Interpretor Name and Language: none  Subjective: Amy Knapp is a 64 y.o. female accompanied by herself Patient was referred by Hurman Horn NP  for mental health. Patient reports the following symptoms/concerns: The patient reported that she has a sinus infection and is not feeling well. She states that her neighbors are still causing her to lose sleep because they constantly are making noise and stomping up the stairway. The patient requested a refill for her Trazodone be called into medication management. The patient states other than being sick everything else is the same. The patient denied suicidal or homicidal thoughts.  Duration of problem: ; Severity of problem: moderate  Objective: Mood: Euthymic and Affect: Appropriate Risk of harm to self or others: No plan to harm self or others  Life Context: Family and Social: see above School/Work: see above Self-Care: see above Life Changes: see above  Patient and/or Family's Strengths/Protective Factors: Concrete supports in place (healthy food, safe environments, etc.)  Goals Addressed: Patient will: 1.  Reduce symptoms of: agitation, anxiety and depression  2.  Increase knowledge and/or ability of: coping skills, self-management skills and stress reduction  3.  Demonstrate ability to: Increase healthy adjustment to current life circumstances  Progress towards Goals: Ongoing  Interventions: Interventions utilized:  Supportive Counseling was utilized by clinician during today's follow up session. Clinician processed with the patient how she has been doing since the last follow up session. Clinician measured the patient's anxiety and depression on a numerical scale. Clinician utilized reflective listening  encouraging the patient to ventilate her frustrations toward her current life circumstances. Clinician offered to message the NP regarding her medication refill request.The clinician explained to the patient her holiday schedule and provided the patient with contact information for the [facility] and [facility] including their crisis line should she need assistance before her next scheduled appointment. Standardized Assessments completed: GAD-7 and PHQ 9  GAD-7    12 PHQ-9      9   Assessment: Patient currently experiencing see above  Patient may benefit from see above  Plan: 1. Follow up with behavioral health clinician on : 10/25/2020 at 4:00 PM  2. Behavioral recommendations: see above 3. Referral(s): Integrated [facility] [facility] (In Clinic) 4. "From scale of 1-10, how likely are you to follow plan?": see above  Judith Part, Student-Social Work

## 2020-10-06 ENCOUNTER — Telehealth: Payer: Self-pay

## 2020-10-11 ENCOUNTER — Ambulatory Visit: Payer: Self-pay

## 2020-10-13 ENCOUNTER — Other Ambulatory Visit: Payer: Self-pay

## 2020-10-20 ENCOUNTER — Other Ambulatory Visit: Payer: Self-pay

## 2020-10-25 ENCOUNTER — Ambulatory Visit: Payer: Self-pay | Admitting: Licensed Clinical Social Worker

## 2020-10-25 ENCOUNTER — Other Ambulatory Visit: Payer: Self-pay | Admitting: Gerontology

## 2020-10-25 DIAGNOSIS — E039 Hypothyroidism, unspecified: Secondary | ICD-10-CM

## 2020-11-01 ENCOUNTER — Encounter: Payer: Self-pay | Admitting: Pulmonary Disease

## 2020-11-01 ENCOUNTER — Ambulatory Visit (INDEPENDENT_AMBULATORY_CARE_PROVIDER_SITE_OTHER): Payer: Self-pay | Admitting: Pulmonary Disease

## 2020-11-01 ENCOUNTER — Other Ambulatory Visit: Payer: Self-pay

## 2020-11-01 VITALS — BP 138/74 | HR 71 | Temp 96.8°F | Ht 62.5 in | Wt 184.6 lb

## 2020-11-01 DIAGNOSIS — R918 Other nonspecific abnormal finding of lung field: Secondary | ICD-10-CM

## 2020-11-01 DIAGNOSIS — F17210 Nicotine dependence, cigarettes, uncomplicated: Secondary | ICD-10-CM

## 2020-11-01 DIAGNOSIS — J449 Chronic obstructive pulmonary disease, unspecified: Secondary | ICD-10-CM

## 2020-11-01 NOTE — Progress Notes (Signed)
Subjective:    Patient ID: Amy Knapp, female    DOB: [birth year], 65 y.o.   MRN: [id number]  HPI The patient is a 65 year old current smoker (half PPD) who presents for evaluation of dyspnea and a small pulmonary nodule.  Patient is kindly referred by Desta AbateNP.  Is a somewhat difficult historian, she is not very forthcoming with the history.  She notes that she has had shortness of breath for a number of years more specifically, 20 years.  She was recently started on Trelegy Ellipta and notes that this helps her.  She has had issues with weight management over the years.  She has been attempting to lose weight.  She has a chronic cough productive mostly of whitish sputum in the mornings.  No hemoptysis.  She has yearly exacerbations requiring antibiotics.  No fevers, chills or sweats.  No sleep disturbance that she knows of.  She does note occasional heartburn and palpitations.  Heartburn is more pronounced if she misses her omeprazole dose.  Otherwise she does well.  She has to use rescue inhaler at least 3-4 times a week.  She has a 49-pack-year history of smoking.  Patient voices no other complaints.   Review of Systems A 10 point review of systems was performed and it is as noted above otherwise negative.  Past Medical History:  Diagnosis Date  . Allergy   . Anxiety   . Arthritis   . Asthma   . Broken leg 2007   Right  . Cataract   . COPD (chronic obstructive pulmonary disease) (HCC)   . Depression   . Diabetes mellitus without complication (HCC)   . Emphysema of lung (HCC)   . GERD (gastroesophageal reflux disease)   . Gout   . Hyperlipidemia   . Sleep apnea   . Thyroid disease    Past Surgical History:  Procedure Laterality Date  . ABDOMINAL HYSTERECTOMY  2000   Partial  . LEG SURGERY Right 10/08/2005   broken right leg  . TUBAL LIGATION     Family History  Problem Relation Age of Onset  . Cancer Mother   . Heart attack Father   . Heart attack  Sister   . Cancer Brother   . Diabetes Daughter   . Diabetes Son   . Hypertension Maternal Aunt   . Breast cancer Neg Hx    Social History   Tobacco Use  . Smoking status: Current Every Day Smoker    Packs/day: 1.00    Years: 49.00    Pack years: 49.00    Types: Cigarettes  . Smokeless tobacco: Never Used  . Tobacco comment: 0.5 PPD 11/01/2020  Substance Use Topics  . Alcohol use: No   Allergies  Allergen Reactions  . Celery Oil     itching  . Demerol [Meperidine]     Burning, feels hot  . Morphine And Related Other (See Comments)    Muscle Spasms  . Penicillins     blisters   Current Meds  Medication Sig  . albuterol (VENTOLIN HFA) 108 (90 Base) MCG/ACT inhaler Inhale 2 puffs into the lungs every 6 (six) hours as needed for wheezing or shortness of breath.  . allopurinol (ZYLOPRIM) 100 MG tablet Take 1 tablet (100 mg total) by mouth daily.  [name] atorvastatin (LIPITOR) 10 MG tablet Take 1 tablet (10 mg total) by mouth daily.  . Fluticasone-Umeclidin-Vilant (TRELEGY ELLIPTA) 100-62.5-25 MCG/INH AEPB Inhale 1 puff into the lungs daily.  [name] gabapentin (NEURONTIN) 400  MG capsule Take 1 capsule (400 mg total) by mouth 2 (two) times daily.  [name] glipiZIDE (GLUCOTROL) 5 MG tablet Take 1 tablet (5 mg total) by mouth daily.  [name] levothyroxine (SYNTHROID) 112 MCG tablet TAKE ONE TABLET BY MOUTH EVERY DAY BEFORE BREAKFAST.  [name] omeprazole (PRILOSEC) 20 MG capsule Take 1 capsule (20 mg total) by mouth daily.  . traZODone (DESYREL) 50 MG tablet Take 1 tablet (50 mg total) by mouth at bedtime.   Immunization History  Administered Date(s) Administered  . Influenza Inj Mdck Quad Pf 11/13/2016  . Influenza,inj,Quad PF,6+ Mos 07/16/2019   We discussed Covid-19 precautions.  I reviewed the vaccine effectiveness and potential side effects in detail to include differences between mRNA vaccines and traditional vaccines (attenuated virus).  Discussion also offered of long-term effectiveness and safety  profile which are unclear at this time.  Discussed current CDC guidance that all patients are recommended COVID-19 vaccinations [when available]-as long as they do not have allergy to components of the vaccine.     Objective:   Physical Exam BP 138/74 (BP Location: Left Arm, Cuff Size: Normal)   Pulse 71   Temp (!) 96.8 F (36 C) (Temporal)   Ht 5' 2.5" (1.588 m)   Wt 184 lb 9.6 oz (83.7 kg)   SpO2 96%   BMI 33.23 kg/m  GENERAL: Somewhat disheveled woman, no acute distress, fully ambulatory, no conversational dyspnea. HEAD: Normocephalic, atraumatic.  EYES: Pupils equal, round, reactive to light.  No scleral icterus.  MOUTH: Nose/mouth/throat not examined due to masking requirements for COVID 19. NECK: Supple. No thyromegaly. Trachea midline. No JVD.  No adenopathy. PULMONARY: Good air entry bilaterally.  No adventitious sounds. CARDIOVASCULAR: S1 and S2. Regular rate and rhythm.  No rubs, murmurs or gallops heard. ABDOMEN: Obese otherwise benign. MUSCULOSKELETAL: No joint deformity, no clubbing, no edema.  NEUROLOGIC: No focal deficit, no gait disturbance, speech is fluent  SKIN: Intact,warm,dry. PSYCH: Morose, taciturn.  Representative slices of CT performed 31 March 2020, independently reviewed, shows areas of bronchial wall thickening and emphysema consistent with COPD with inflammatory component.  She has small pulmonary nodules measuring less than 5 mm or smaller 1 shown as below:         Assessment & Plan:     ICD-10-CM   1. COPD mixed type (HCC)  J44.9 Pulmonary Function Test [facility] Only   Emphysema and chronic bronchitis Continue Trelegy and as needed albuterol PFTs  2. Multiple lung nodules on CT  R91.8    Follow with yearly low-dose CT for lung cancer screening  3. Tobacco dependence due to cigarettes  F17.210    Patient was counseled regards to discontinuation of smoking Total counseling time 3 to 5 minutes   Orders Placed This Encounter  Procedures  .  Pulmonary Function Test [facility] Only    Next available.    Standing Status:   Future    Standing Expiration Date:   11/01/2021    Order Specific Question:   Full PFT: includes the following: basic spirometry, spirometry pre & post bronchodilator, diffusion capacity (DLCO), lung volumes    Answer:   Full PFT   Discussion:  By initial clinical impression the patient has significant mixed type COPD.  She however is doing well by her own admission on Trelegy Ellipta.  We will continue this.  She has very small/minute pulmonary nodules these will need to be followed with lung cancer screening CT.  PFTs will be ordered to better delineate her COPD status.  We will see her in follow-up in 3 months time she is to contact us prior to that time should any new difficulties arise.   Gailen Shelter, MD [facility]   *This note was dictated using voice recognition software/Dragon.  Despite best efforts to proofread, errors can occur which can change the meaning.  Any change was purely unintentional.

## 2020-11-01 NOTE — Patient Instructions (Signed)
Continue using Trelegy this is working well for you.  We will schedule breathing tests.   We will see you in follow-up in 3 months time call sooner should any new problems arise.   You have very tiny nodules in your lung, nothing to do for these we just follow these yearly.  You will have a lung cancer screening CT yearly that we will follow on these findings.

## 2020-11-03 ENCOUNTER — Encounter: Payer: Self-pay | Admitting: Licensed Clinical Social Worker

## 2020-11-03 ENCOUNTER — Ambulatory Visit: Payer: Self-pay

## 2020-11-03 ENCOUNTER — Other Ambulatory Visit: Payer: Self-pay | Admitting: Gerontology

## 2020-11-03 ENCOUNTER — Ambulatory Visit: Payer: Self-pay | Admitting: Licensed Clinical Social Worker

## 2020-11-03 ENCOUNTER — Other Ambulatory Visit: Payer: Self-pay

## 2020-11-03 DIAGNOSIS — G4700 Insomnia, unspecified: Secondary | ICD-10-CM

## 2020-11-03 DIAGNOSIS — F4323 Adjustment disorder with mixed anxiety and depressed mood: Secondary | ICD-10-CM

## 2020-11-03 MED ORDER — TRAZODONE HCL 50 MG PO TABS: 50.0000 mg | ORAL_TABLET | Freq: Every day | ORAL | 1 refills | Status: DC

## 2020-11-04 ENCOUNTER — Encounter: Payer: Self-pay | Admitting: Pulmonary Disease

## 2020-11-04 LAB — TSH: TSH: 9.12 u[IU]/mL — ABNORMAL HIGH (ref 0.450–4.500)

## 2020-11-04 LAB — HEMOGLOBIN A1C
Est. average glucose Bld gHb Est-mCnc: 189 mg/dL
Hgb A1c MFr Bld: 8.2 % — ABNORMAL HIGH (ref 4.8–5.6)

## 2020-11-08 ENCOUNTER — Ambulatory Visit: Payer: Self-pay | Admitting: Licensed Clinical Social Worker

## 2020-11-08 NOTE — Progress Notes (Signed)
This encounter was created in error - please disregard.

## 2020-11-17 ENCOUNTER — Encounter: Payer: Self-pay | Admitting: Obstetrics and Gynecology

## 2020-11-17 ENCOUNTER — Ambulatory Visit: Payer: Self-pay | Admitting: Obstetrics and Gynecology

## 2020-11-17 ENCOUNTER — Other Ambulatory Visit: Payer: Self-pay | Admitting: Obstetrics and Gynecology

## 2020-11-17 ENCOUNTER — Other Ambulatory Visit: Payer: Self-pay

## 2020-11-17 VITALS — BP 121/78 | HR 87 | Temp 97.0°F | Ht 62.0 in | Wt 176.0 lb

## 2020-11-17 DIAGNOSIS — E039 Hypothyroidism, unspecified: Secondary | ICD-10-CM

## 2020-11-17 DIAGNOSIS — E1140 Type 2 diabetes mellitus with diabetic neuropathy, unspecified: Secondary | ICD-10-CM

## 2020-11-17 DIAGNOSIS — R1310 Dysphagia, unspecified: Secondary | ICD-10-CM

## 2020-11-17 DIAGNOSIS — J441 Chronic obstructive pulmonary disease with (acute) exacerbation: Secondary | ICD-10-CM

## 2020-11-17 MED ORDER — LEVOTHYROXINE SODIUM 112 MCG PO TABS: ORAL_TABLET | ORAL | 0 refills | Status: DC

## 2020-11-17 MED ORDER — MONTELUKAST SODIUM 10 MG PO TABS: 10.0000 mg | ORAL_TABLET | Freq: Every day | ORAL | 1 refills | Status: DC

## 2020-11-17 NOTE — Patient Instructions (Signed)
I value your feedback and you entrusting us with your care. If you get a [facility] patient survey, I would appreciate you taking the time to let us know about your experience today. Thank you! ? ? ?

## 2020-11-17 NOTE — Progress Notes (Signed)
[facility]    Abate, Desta A, NP   No chief complaint on file.   HPI:      [name] is a 65 y.o. No obstetric history on file. whose LMP was No LMP recorded. Patient is postmenopausal., presents today for lab and medication f/u. Hx of hypothyroidism, on levo 112 mcg daily. TSh improved but still elevated. Still has dysphagia and hasn't had thyroid u/s yet. Having trouble sleeping, takes trazodone, sometimes with relief.  Hx of DM, not controlled in past. On glipizide 5 mg daily, not adjusting for blood sugars. Can't measure glucose now due to broken machine.  Had diarrhea with metformin. HgA1C improved after 6 wks. HgA1C 9.4 11/21 and decreased to 8.2 1/22.    Past Medical History:  Diagnosis Date  . Allergy   . Anxiety   . Arthritis   . Asthma   . Broken leg 2007   Right  . Cataract   . COPD (chronic obstructive pulmonary disease) (HCC)   . Depression   . Diabetes mellitus without complication (HCC)   . Emphysema of lung (HCC)   . GERD (gastroesophageal reflux disease)   . Gout   . Hyperlipidemia   . Sleep apnea   . Thyroid disease     Past Surgical History:  Procedure Laterality Date  . ABDOMINAL HYSTERECTOMY  2000   Partial  . LEG SURGERY Right 10/08/2005   broken right leg  . TUBAL LIGATION      Family History  Problem Relation Age of Onset  . Cancer Mother   . Heart attack Father   . Heart attack Sister   . Cancer Brother   . Diabetes Daughter   . Diabetes Son   . Hypertension Maternal Aunt   . Breast cancer Neg Hx     Social History   Socioeconomic History  . Marital status: Divorced    Spouse name: Not on file  . Number of children: 4  . Years of education: Not on file  . Highest education level: High school graduate  Occupational History  . Occupation: Unemployed  Tobacco Use  . Smoking status: Current Every Day Smoker    Packs/day: 1.00    Years: 49.00    Pack years: 49.00    Types: Cigarettes   . Smokeless tobacco: Never Used  . Tobacco comment: 0.5 PPD 11/01/2020  Vaping Use  . Vaping Use: Never used  Substance and Sexual Activity  . Alcohol use: No  . Drug use: No  . Sexual activity: Not Currently  Other Topics Concern  . Not on file  Social History Narrative  . Not on file   Social Determinants of Health   Financial Resource Strain: Not on file  Food Insecurity: Not on file  Transportation Needs: Not on file  Physical Activity: Not on file  Stress: Not on file  Social Connections: Not on file  Intimate Partner Violence: Not on file    Outpatient Medications Prior to Visit  Medication Sig Dispense Refill  . albuterol (VENTOLIN HFA) 108 (90 Base) MCG/ACT inhaler Inhale 2 puffs into the lungs every 6 (six) hours as needed for wheezing or shortness of breath. 54 g 2  . allopurinol (ZYLOPRIM) 100 MG tablet Take 1 tablet (100 mg total) by mouth daily. 90 tablet 3  . atorvastatin (LIPITOR) 10 MG tablet Take 1 tablet (10 mg total) by mouth daily. 30 tablet 2  . Fluticasone-Umeclidin-Vilant (TRELEGY ELLIPTA) 100-62.5-25 MCG/INH AEPB Inhale  1 puff into the lungs daily. 60 each 3  . gabapentin (NEURONTIN) 400 MG capsule Take 1 capsule (400 mg total) by mouth 2 (two) times daily. 60 capsule 2  . glipiZIDE (GLUCOTROL) 5 MG tablet Take 1 tablet (5 mg total) by mouth daily. 30 tablet 2  . omeprazole (PRILOSEC) 20 MG capsule Take 1 capsule (20 mg total) by mouth daily. 30 capsule 1  . traZODone (DESYREL) 50 MG tablet Take 1 tablet (50 mg total) by mouth at bedtime. 30 tablet 1  . levothyroxine (SYNTHROID) 112 MCG tablet TAKE ONE TABLET BY MOUTH EVERY DAY BEFORE BREAKFAST. 30 tablet 0  . montelukast (SINGULAIR) 10 MG tablet Take 1 tablet (10 mg total) by mouth at bedtime. (Patient not taking: Reported on 11/01/2020) 30 tablet 1  . varenicline (CHANTIX CONTINUING MONTH PAK) 1 MG tablet TAKE 1 TABLET BY MOUTH 2 TIMES A DAY (Patient not taking: Reported on 11/01/2020) 60 tablet 1   No  facility-administered medications prior to visit.      ROS:  Review of Systems BREAST: No symptoms   OBJECTIVE:   Vitals:  BP 121/78   Pulse 87   Temp (!) 97 F (36.1 C)   Ht 5\' 2"  (1.575 m)   Wt 176 lb (79.8 kg)   SpO2 90%   BMI 32.19 kg/m   Physical Exam  Results: No results found for this or any previous visit (from the past 24 hour(s)).   Assessment/Plan:  Type 2 diabetes mellitus with diabetic neuropathy, without long-term current use of insulin (HCC) - Plan: Hemoglobin A1c, Cont glipizide, get new glucose monitor tonight. Recheck labs in 4 wks since improving. Has RF.   Hypothyroidism, unspecified type - Plan: levothyroxine (SYNTHROID) 112 MCG tablet, TSH, THYROID; increase levo to 112 mcg daily with 2 tabs on Wed and Sun. Rechk labs in 4 wks. Sched thyroid u/s.,   Dysphagia, unspecified type - Plan: Tue THYROID  Chronic obstructive pulmonary disease with acute exacerbation (HCC) - Plan: montelukast (SINGULAIR) 10 MG tablet; Rx RF per pt request. On inhalers with pulmonology   Meds ordered this encounter  Medications  . levothyroxine (SYNTHROID) 112 MCG tablet    Sig: Take 1 pill daily except 2 tabs on Wed and Sun    Dispense:  40 tablet    Refill:  0    Order Specific Question:   Supervising Provider    Answer:   Mon Nadara Mustard  . montelukast (SINGULAIR) 10 MG tablet    Sig: Take 1 tablet (10 mg total) by mouth at bedtime.    Dispense:  30 tablet    Refill:  1    Order Specific Question:   Supervising Provider    Answer:   [id number] Nadara Mustard      Return in about 4 weeks (around 12/15/2020) for f/u labs/meds.  Mckynzie Liwanag B. Elzy Tomasello, PA-C 11/17/2020 6:37 PM

## 2020-11-21 ENCOUNTER — Other Ambulatory Visit: Payer: Self-pay

## 2020-11-21 ENCOUNTER — Encounter: Payer: Self-pay | Admitting: Emergency Medicine

## 2020-11-21 ENCOUNTER — Emergency Department
Admission: EM | Admit: 2020-11-21 | Discharge: 2020-11-21 | Disposition: A | Discharge status: Home / Self Care | Payer: Self-pay | Attending: Emergency Medicine | Admitting: Emergency Medicine

## 2020-11-21 ENCOUNTER — Emergency Department: Payer: Self-pay

## 2020-11-21 DIAGNOSIS — E1140 Type 2 diabetes mellitus with diabetic neuropathy, unspecified: Secondary | ICD-10-CM | POA: Insufficient documentation

## 2020-11-21 DIAGNOSIS — Z79899 Other long term (current) drug therapy: Secondary | ICD-10-CM | POA: Insufficient documentation

## 2020-11-21 DIAGNOSIS — R079 Chest pain, unspecified: Secondary | ICD-10-CM

## 2020-11-21 DIAGNOSIS — J45909 Unspecified asthma, uncomplicated: Secondary | ICD-10-CM | POA: Insufficient documentation

## 2020-11-21 DIAGNOSIS — J449 Chronic obstructive pulmonary disease, unspecified: Secondary | ICD-10-CM | POA: Insufficient documentation

## 2020-11-21 DIAGNOSIS — Z716 Tobacco abuse counseling: Secondary | ICD-10-CM

## 2020-11-21 DIAGNOSIS — R739 Hyperglycemia, unspecified: Secondary | ICD-10-CM

## 2020-11-21 DIAGNOSIS — E039 Hypothyroidism, unspecified: Secondary | ICD-10-CM | POA: Insufficient documentation

## 2020-11-21 DIAGNOSIS — I10 Essential (primary) hypertension: Secondary | ICD-10-CM | POA: Insufficient documentation

## 2020-11-21 DIAGNOSIS — Z7984 Long term (current) use of oral hypoglycemic drugs: Secondary | ICD-10-CM | POA: Insufficient documentation

## 2020-11-21 DIAGNOSIS — R002 Palpitations: Secondary | ICD-10-CM

## 2020-11-21 DIAGNOSIS — Z7951 Long term (current) use of inhaled steroids: Secondary | ICD-10-CM | POA: Insufficient documentation

## 2020-11-21 DIAGNOSIS — F17210 Nicotine dependence, cigarettes, uncomplicated: Secondary | ICD-10-CM | POA: Insufficient documentation

## 2020-11-21 DIAGNOSIS — E1165 Type 2 diabetes mellitus with hyperglycemia: Secondary | ICD-10-CM | POA: Insufficient documentation

## 2020-11-21 LAB — BASIC METABOLIC PANEL
Anion gap: 11 (ref 5–15)
BUN: 11 mg/dL (ref 8–23)
CO2: 25 mmol/L (ref 22–32)
Calcium: 9.3 mg/dL (ref 8.9–10.3)
Chloride: 101 mmol/L (ref 98–111)
Creatinine, Ser: 0.97 mg/dL (ref 0.44–1.00)
GFR, Estimated: >60 mL/min (ref >60)
Glucose, Bld: 258 mg/dL — ABNORMAL HIGH (ref 70–99)
Potassium: 3.7 mmol/L (ref 3.5–5.1)
Sodium: 137 mmol/L (ref 135–145)

## 2020-11-21 LAB — CBC
HCT: 43.7 % (ref 36.0–46.0)
Hemoglobin: 14.6 g/dL (ref 12.0–15.0)
MCH: 30.2 pg (ref 26.0–34.0)
MCHC: 33.4 g/dL (ref 30.0–36.0)
MCV: 90.5 fL (ref 80.0–100.0)
Platelets: 276 10*3/uL (ref 150–400)
RBC: 4.83 MIL/uL (ref 3.87–5.11)
RDW: 13.2 % (ref 11.5–15.5)
WBC: 8.2 10*3/uL (ref 4.0–10.5)
nRBC: 0 % (ref 0.0–0.2)

## 2020-11-21 LAB — TROPONIN I (HIGH SENSITIVITY)
Troponin I (High Sensitivity): 5 ng/L (ref <18)
Troponin I (High Sensitivity): 6 ng/L (ref <18)

## 2020-11-21 LAB — TSH: TSH: 7.248 u[IU]/mL — ABNORMAL HIGH (ref 0.350–4.500)

## 2020-11-21 LAB — FIBRIN DERIVATIVES D-DIMER (ARMC ONLY): Fibrin derivatives D-dimer (ARMC): 507.33 ng/mL (FEU) — ABNORMAL HIGH (ref 0.00–499.00)

## 2020-11-21 NOTE — ED Provider Notes (Signed)
[facility] Emergency Department Provider Note  ____________________________________________   Event Date/Time   First MD Initiated Contact with Patient 11/21/20 2039     (approximate)  I have reviewed the triage vital signs and the nursing notes.   HISTORY  Chief Complaint Palpitations   HPI Amy Knapp is a 65 y.o. female with past medical history of  HDL, OSA, hypothyroidism on Synthroid, COPD, ongoing tobacco abuse, asthma, arthritis, anxiety and gout who presents for assessment of an episode of palpitations she experienced earlier this afternoon as well as a little bit of chest tightness.  Patient states she was doing some chores around her home when that she started feeling like her heart was going faster than normal.  She states it lasted less than an hour and seem to resolve when she stopped to rest but then she had a little bit of chest tightness as well as afternoon.  States she went to get her blood pressure checked and at [facility] it was 160/100 she felt lightheaded so wanted to come to the emergency room.  She denies any known history of hypertension.  She denies any significant EtOH or illicit drugs.  No prior similar episodes.  No clear alleviating or aggravating factors.          Past Medical History:  Diagnosis Date  . Allergy   . Anxiety   . Arthritis   . Asthma   . Broken leg 2007   Right  . Cataract   . COPD (chronic obstructive pulmonary disease) (HCC)   . Depression   . Diabetes mellitus without complication (HCC)   . Emphysema of lung (HCC)   . GERD (gastroesophageal reflux disease)   . Gout   . Hyperlipidemia   . Sleep apnea   . Thyroid disease     Patient Active Problem List   Diagnosis Date Noted  . Type 2 diabetes mellitus with diabetic neuropathy, unspecified (HCC) 03/10/2020  . Nipple discharge 03/10/2020  . Flu vaccine need 11/13/2016  . Hyperlipidemia 08/14/2016  . Edema extremities 08/14/2016  .  Lower back pain 12/09/2014  . Hypothyroidism 11/11/2014  . COPD (chronic obstructive pulmonary disease) (HCC) 11/11/2014  . Gout 11/11/2014  . Seborrheic keratosis 10/28/2014  . Diabetes (HCC) 09/14/2014  . Sleep apnea 06/29/2014    Past Surgical History:  Procedure Laterality Date  . ABDOMINAL HYSTERECTOMY  2000   Partial  . LEG SURGERY Right 10/08/2005   broken right leg  . TUBAL LIGATION      Prior to Admission medications   Medication Sig Start Date End Date Taking? Authorizing Provider  albuterol (VENTOLIN HFA) 108 (90 Base) MCG/ACT inhaler Inhale 2 puffs into the lungs every 6 (six) hours as needed for wheezing or shortness of breath. 08/09/20   Abate, Desta A, NP  allopurinol (ZYLOPRIM) 100 MG tablet Take 1 tablet (100 mg total) by mouth daily. 06/25/19   Michiel Cowboy A, PA-C  atorvastatin (LIPITOR) 10 MG tablet Take 1 tablet (10 mg total) by mouth daily. 08/09/20   Abate, Desta A, NP  Fluticasone-Umeclidin-Vilant (TRELEGY ELLIPTA) 100-62.5-25 MCG/INH AEPB Inhale 1 puff into the lungs daily. 08/10/20   Iloabachie, Chioma E, NP  gabapentin (NEURONTIN) 400 MG capsule Take 1 capsule (400 mg total) by mouth 2 (two) times daily. 08/09/20   Abate, Desta A, NP  glipiZIDE (GLUCOTROL) 5 MG tablet Take 1 tablet (5 mg total) by mouth daily. 03/31/20   Iloabachie, Chioma E, NP  levothyroxine (SYNTHROID) 112 MCG tablet  Take 1 pill daily except 2 tabs on Wed and Sun 11/17/20   Copland, Alicia B, PA-C  montelukast (SINGULAIR) 10 MG tablet Take 1 tablet (10 mg total) by mouth at bedtime. 11/17/20   Copland, Ilona Sorrel, PA-C  omeprazole (PRILOSEC) 20 MG capsule Take 1 capsule (20 mg total) by mouth daily. 08/09/20   Abate, Desta A, NP  traZODone (DESYREL) 50 MG tablet Take 1 tablet (50 mg total) by mouth at bedtime. 11/03/20   Iloabachie, Chioma E, NP    Allergies Celery oil, Demerol [meperidine], Morphine and related, and Penicillins  Family History  Problem Relation Age of Onset  . Cancer Mother    . Heart attack Father   . Heart attack Sister   . Cancer Brother   . Diabetes Daughter   . Diabetes Son   . Hypertension Maternal Aunt   . Breast cancer Neg Hx     Social History Social History   Tobacco Use  . Smoking status: Current Every Day Smoker    Packs/day: 1.00    Years: 49.00    Pack years: 49.00    Types: Cigarettes  . Smokeless tobacco: Never Used  . Tobacco comment: 0.5 PPD 11/01/2020  Vaping Use  . Vaping Use: Never used  Substance Use Topics  . Alcohol use: No  . Drug use: No    Review of Systems  Review of Systems  Constitutional: Negative for chills and fever.  HENT: Negative for sore throat.   Eyes: Negative for pain.  Respiratory: Positive for cough ( chronic, no change from baseline). Negative for stridor.   Cardiovascular: Positive for chest pain ( tightness) and palpitations.  Gastrointestinal: Negative for vomiting.  Genitourinary: Negative for dysuria.  Musculoskeletal: Negative for myalgias.  Skin: Negative for rash.  Neurological: Negative for seizures, loss of consciousness and headaches.  Psychiatric/Behavioral: Negative for suicidal ideas.  All other systems reviewed and are negative.     ____________________________________________   PHYSICAL EXAM:  VITAL SIGNS: ED Triage Vitals  Enc Vitals Group     BP 11/21/20 1800 (!) 145/76     Pulse Rate 11/21/20 1800 83     Resp 11/21/20 1800 20     Temp 11/21/20 1800 98.4 F (36.9 C)     Temp Source 11/21/20 1800 Oral     SpO2 11/21/20 1800 93 %     Weight 11/21/20 1758 176 lb (79.8 kg)     Height 11/21/20 1758 5\' 2"  (1.575 m)     Head Circumference --      Peak Flow --      Pain Score 11/21/20 1758 6     Pain Loc --      Pain Edu? --      Excl. in GC? --    Vitals:   11/21/20 2045 11/21/20 2130  BP: 131/85 127/68  Pulse: 74 84  Resp: (!) 28 (!) 23  Temp:    SpO2: 97% 94%   Physical Exam Vitals and nursing note reviewed.  Constitutional:      General: She is not in  acute distress.    Appearance: She is well-developed and well-nourished.  HENT:     Head: Normocephalic and atraumatic.     Right Ear: External ear normal.     Left Ear: External ear normal.     Nose: Nose normal.  Eyes:     Conjunctiva/sclera: Conjunctivae normal.  Cardiovascular:     Rate and Rhythm: Normal rate and regular rhythm.  Heart sounds: No murmur heard.   Pulmonary:     Effort: Pulmonary effort is normal. No respiratory distress.     Breath sounds: Normal breath sounds.  Abdominal:     Palpations: Abdomen is soft.     Tenderness: There is no abdominal tenderness.  Musculoskeletal:        General: No edema.     Cervical back: Neck supple.  Skin:    General: Skin is warm and dry.     Capillary Refill: Capillary refill takes less than 2 seconds.  Neurological:     Mental Status: She is alert and oriented to person, place, and time.  Psychiatric:        Mood and Affect: Mood and affect and mood normal.      ____________________________________________   LABS (all labs ordered are listed, but only abnormal results are displayed)  Labs Reviewed  BASIC METABOLIC PANEL - Abnormal; Notable for the following components:      Result Value   Glucose, Bld 258 (*)    All other components within normal limits  FIBRIN DERIVATIVES D-DIMER ([facility] ONLY) - Abnormal; Notable for the following components:   Fibrin derivatives D-dimer ([facility]) 507.33 (*)    All other components within normal limits  CBC  TSH  TROPONIN I (HIGH SENSITIVITY)  TROPONIN I (HIGH SENSITIVITY)   ____________________________________________  EKG  Sinus rhythm with a ventricular of 75, normal axis, unremarkable's, nonspecific T wave changes versus artifact in lead III without other clear evidence of acute ischemia or significant underlying arrhythmia. ____________________________________________  RADIOLOGY  ED MD interpretation: No focal consolidation, large effusion, edema, pneumothorax, or  other clear acute intrathoracic abnormality.  Official radiology report(s): DG Chest 2 View  Result Date: 11/21/2020 CLINICAL DATA:  Chest pain EXAM: CHEST - 2 VIEW COMPARISON:  02/15/2020 FINDINGS: The heart size and mediastinal contours are within normal limits. Atherosclerotic calcification of the aortic knob. Mildly hyperinflated lungs with chronically coarsened interstitial markings. No focal airspace consolidation, pleural effusion, or pneumothorax. The visualized skeletal structures are unremarkable. IMPRESSION: No active cardiopulmonary disease. Electronically Signed   By: Duanne GuessNicholas  Plundo D.O.   On: 11/21/2020 19:03    ____________________________________________   PROCEDURES  Procedure(s) performed (including Critical Care):  .1-3 Lead EKG Interpretation Performed by: Gilles ChiquitoSmith, Deane Wattenbarger P, MD Authorized by: Gilles ChiquitoSmith, Pookela Sellin P, MD     Interpretation: normal     ECG rate assessment: normal     Rhythm: sinus rhythm     Ectopy: none     Conduction: normal       ____________________________________________   INITIAL IMPRESSION / ASSESSMENT AND PLAN / ED COURSE      Patient presents with above to history exam for assessment of some palpitations and chest tightness she experienced earlier this afternoon while doing some chores.    On arrival she is afebrile hemodynamically stable although slightly hypertensive with BP of 145/76 and borderline hypoxic with SPO2 of 95% on room air.  Primary differential includes but is not limited to ACS, arrhythmia, PE, pneumonia, heart failure, COPD exacerbation, symptomatic anemia, metabolic derangements.  Patient has symmetric upper extremity pulses and no widening on chest x-ray of the mediastinum to suggest dissection.  While here ECG does have some nonspecific changes given to nonelevated troponins obtained over 2 hours of suspicion for ACS or myocarditis.  CBC shows no leukocytosis or acute anemia.  BMP shows glucose of 258 which patient  states sometimes happens after she eats no other significant derangements.  No evidence of  acidosis or DKA.  Low suspicion for PE as patient's D-dimer at 507 this is within age-adjusted limits.  Chest x-ray shows no evidence of pneumothorax, pulmonary edema that would suggest heart failure, effusion, focal consolidation suggestive of pneumonia or any other clear acute intrathoracic process.  No wheezing or significant increased work of breathing on exam to suggest acute COPD exacerbation.  On my reassessment patient is asleep but easily arousable and states she feels much better.  Given stable vitals with reassuring exam work-up and improvement in blood pressure to 127/68 blood suspicion for immediate life-threatening pathology at this time.  Advised patient that we can send a TSH but this can follow-up with her PCP to ensure report is therapeutic for her but overall of low suspicion for acute thyrotoxicosis.  Patient voiced understanding and agreement this plan.  Discharged stable condition.  Return precautions advised discussed.       ____________________________________________   FINAL CLINICAL IMPRESSION(S) / ED DIAGNOSES  Final diagnoses:  Palpitations  Chest pain, unspecified type  Hypertension, unspecified type  Tobacco abuse counseling  Hyperglycemia    Medications - No data to display   ED Discharge Orders    None       Note:  This document was prepared using Dragon voice recognition software and may include unintentional dictation errors.   Gilles Chiquito, MD 11/21/20 [phone]

## 2020-11-21 NOTE — ED Triage Notes (Signed)
Pt to ED via POV, states earlier today felt her heart racing but that normally only happens when she gets stressed. Pt states checked her BP PTA and BP was elevated at 160/100. Pt states near-syncopal episode PTA. Pt also c/o mild CP at this time.

## 2020-11-22 ENCOUNTER — Other Ambulatory Visit: Payer: Self-pay | Admitting: Family Medicine

## 2020-11-22 DIAGNOSIS — K219 Gastro-esophageal reflux disease without esophagitis: Secondary | ICD-10-CM

## 2020-11-23 ENCOUNTER — Other Ambulatory Visit: Payer: Self-pay | Admitting: Gerontology

## 2020-11-23 DIAGNOSIS — K219 Gastro-esophageal reflux disease without esophagitis: Secondary | ICD-10-CM

## 2020-11-28 ENCOUNTER — Telehealth: Payer: Self-pay | Admitting: Pharmacist

## 2020-11-28 NOTE — Telephone Encounter (Signed)
11/28/2020 3:39:44 PM - Trelegy Ellipta 100/62.5 refilled online with [facility]  -- Rhetta Mura - Monday, November 28, 2020 3:38 PM -- Placed refill online with [facility] for Trelegy Ellipta 100/62.5--Rx#[id number], order# [id number], allow 10-14 business days to receive.

## 2020-12-08 ENCOUNTER — Other Ambulatory Visit: Payer: Self-pay | Admitting: Gerontology

## 2020-12-08 DIAGNOSIS — J441 Chronic obstructive pulmonary disease with (acute) exacerbation: Secondary | ICD-10-CM

## 2020-12-13 ENCOUNTER — Other Ambulatory Visit: Payer: Self-pay | Admitting: Gerontology

## 2020-12-13 DIAGNOSIS — J441 Chronic obstructive pulmonary disease with (acute) exacerbation: Secondary | ICD-10-CM

## 2020-12-15 ENCOUNTER — Other Ambulatory Visit: Payer: Self-pay

## 2020-12-15 DIAGNOSIS — E1140 Type 2 diabetes mellitus with diabetic neuropathy, unspecified: Secondary | ICD-10-CM

## 2020-12-15 DIAGNOSIS — E039 Hypothyroidism, unspecified: Secondary | ICD-10-CM

## 2020-12-16 ENCOUNTER — Other Ambulatory Visit: Payer: Self-pay | Admitting: Gerontology

## 2020-12-16 DIAGNOSIS — J441 Chronic obstructive pulmonary disease with (acute) exacerbation: Secondary | ICD-10-CM

## 2020-12-16 LAB — TSH: TSH: 0.355 u[IU]/mL — ABNORMAL LOW (ref 0.450–4.500)

## 2020-12-16 LAB — HEMOGLOBIN A1C
Est. average glucose Bld gHb Est-mCnc: 174 mg/dL
Hgb A1c MFr Bld: 7.7 % — ABNORMAL HIGH (ref 4.8–5.6)

## 2020-12-20 ENCOUNTER — Other Ambulatory Visit: Payer: Self-pay | Admitting: Gerontology

## 2020-12-22 ENCOUNTER — Other Ambulatory Visit: Payer: Self-pay

## 2020-12-22 ENCOUNTER — Other Ambulatory Visit: Payer: Self-pay | Admitting: Gerontology

## 2020-12-22 ENCOUNTER — Encounter: Payer: Self-pay | Admitting: Gerontology

## 2020-12-22 ENCOUNTER — Ambulatory Visit: Payer: Self-pay | Admitting: Gerontology

## 2020-12-22 VITALS — BP 143/86 | HR 94 | Temp 98.2°F | Resp 16 | Ht 61.0 in | Wt 171.3 lb

## 2020-12-22 DIAGNOSIS — M109 Gout, unspecified: Secondary | ICD-10-CM

## 2020-12-22 DIAGNOSIS — E1140 Type 2 diabetes mellitus with diabetic neuropathy, unspecified: Secondary | ICD-10-CM

## 2020-12-22 DIAGNOSIS — Z0000 Encounter for general adult medical examination without abnormal findings: Secondary | ICD-10-CM

## 2020-12-22 DIAGNOSIS — M79605 Pain in left leg: Secondary | ICD-10-CM | POA: Insufficient documentation

## 2020-12-22 DIAGNOSIS — E039 Hypothyroidism, unspecified: Secondary | ICD-10-CM

## 2020-12-22 MED ORDER — LEVOTHYROXINE SODIUM 100 MCG PO TABS: ORAL_TABLET | ORAL | 0 refills | Status: DC

## 2020-12-22 MED ORDER — IBUPROFEN 800 MG PO TABS: 800.0000 mg | ORAL_TABLET | Freq: Two times a day (BID) | ORAL | 0 refills | Status: DC

## 2020-12-22 MED ORDER — ALLOPURINOL 100 MG PO TABS: 100.0000 mg | ORAL_TABLET | Freq: Every day | ORAL | 1 refills | Status: DC

## 2020-12-22 MED ORDER — GLIPIZIDE 5 MG PO TABS: 5.0000 mg | ORAL_TABLET | Freq: Every day | ORAL | 2 refills | Status: DC

## 2020-12-22 NOTE — Progress Notes (Signed)
Established Patient Office Visit  Subjective:  Patient ID: Amy Knapp, female    DOB: June 04, 1956  Age: 65 y.o. MRN: [id number]  CC: No chief complaint on file.   HPI Amy Knapp is a 65 y/o female who has a history of hypothyroidism, HDL, OSA, Gerd, Anxiety, COPD, who presents for follow up after ED visit and medication refill. She was seen at the ED on 11/21/20 for Palpitation, chest x ray showed no active cardiopulmonary disease. She had labs done on 12/15/20, Her TSH was 0.355 and she continues to take 112 mcg of levothyroxine. Currently, she continues to experience intermittent palpitation every other day,associated with chest pain , denies dizziness and light headedness.  Her HgbA1c was 7.7%. She states that she's compliant with her medications and continues to make healthy lifestyle choices. She checks her fasting blood glucose daily and states that it's less than 130 mg/dl. She states that her peripheral  Neuropathy is under control with taking gabapentin. She also c/o non traumatic intermittent non radiating pain from her left ankle to her knee after she gets off work. She describes pain as sharp and 8/10 intensity. She states that she's either standing or walking on a hard floor at work for 10 hours Mon-Thursday and 8 hours Friday and Saturday. She admits to experiencing claudication  and tightness to her left shin when walking. Her monofilament exam was normal, but her toe nails are long, thick and clawed, she also has dried callus to the balls of her right foot. She states that she needs to work less ours and take care of herself.  Past Medical History:  Diagnosis Date  . Allergy   . Anxiety   . Arthritis   . Asthma   . Broken leg 2007   Right  . Cataract   . COPD (chronic obstructive pulmonary disease) (HCC)   . Depression   . Diabetes mellitus without complication (HCC)   . Emphysema of lung (HCC)   . GERD (gastroesophageal reflux disease)   . Gout   .  Hyperlipidemia   . Sleep apnea   . Thyroid disease     Past Surgical History:  Procedure Laterality Date  . ABDOMINAL HYSTERECTOMY  2000   Partial  . LEG SURGERY Right 10/08/2005   broken right leg  . TUBAL LIGATION      Family History  Problem Relation Age of Onset  . Cancer Mother   . Heart attack Father   . Heart attack Sister   . Cancer Brother   . Diabetes Daughter   . Diabetes Son   . Hypertension Maternal Aunt   . Breast cancer Neg Hx     Social History   Socioeconomic History  . Marital status: Divorced    Spouse name: Not on file  . Number of children: 4  . Years of education: Not on file  . Highest education level: High school graduate  Occupational History  . Occupation: Unemployed  Tobacco Use  . Smoking status: Current Every Day Smoker    Packs/day: 1.00    Years: 49.00    Pack years: 49.00    Types: Cigarettes  . Smokeless tobacco: Never Used  . Tobacco comment: 0.5 PPD 11/01/2020  Vaping Use  . Vaping Use: Never used  Substance and Sexual Activity  . Alcohol use: No  . Drug use: No  . Sexual activity: Not Currently  Other Topics Concern  . Not on file  Social History Narrative  . Not on  file   Social Determinants of Health   Financial Resource Strain: Not on file  Food Insecurity: Not on file  Transportation Needs: Not on file  Physical Activity: Not on file  Stress: Not on file  Social Connections: Not on file  Intimate Partner Violence: Not on file    Outpatient Medications Prior to Visit  Medication Sig Dispense Refill  . albuterol (VENTOLIN HFA) 108 (90 Base) MCG/ACT inhaler Inhale 2 puffs into the lungs every 6 (six) hours as needed for wheezing or shortness of breath. 54 g 2  . atorvastatin (LIPITOR) 10 MG tablet Take 1 tablet (10 mg total) by mouth daily. 30 tablet 2  . gabapentin (NEURONTIN) 400 MG capsule Take 1 capsule (400 mg total) by mouth 2 (two) times daily. 60 capsule 2  . montelukast (SINGULAIR) 10 MG tablet Take 1  tablet (10 mg total) by mouth at bedtime. 30 tablet 1  . omeprazole (PRILOSEC) 20 MG capsule TAKE ONE CAPSULE BY MOUTH EVERY DAY 60 capsule 0  . traZODone (DESYREL) 50 MG tablet Take 1 tablet (50 mg total) by mouth at bedtime. 30 tablet 1  . TRELEGY ELLIPTA 100-62.5-25 MCG/INH AEPB INHALE 1 PUFF INTO THE LUNGS EVERY DAY 1 each 2  . glipiZIDE (GLUCOTROL) 5 MG tablet Take 1 tablet (5 mg total) by mouth daily. 30 tablet 2  . levothyroxine (SYNTHROID) 112 MCG tablet Take 1 pill daily except 2 tabs on Wed and Sun 40 tablet 0  . allopurinol (ZYLOPRIM) 100 MG tablet Take 1 tablet (100 mg total) by mouth daily. (Patient not taking: Reported on 12/22/2020) 90 tablet 3   No facility-administered medications prior to visit.    Allergies  Allergen Reactions  . Celery Oil     itching  . Demerol [Meperidine]     Burning, feels hot  . Morphine And Related Other (See Comments)    Muscle Spasms  . Penicillins     blisters    ROS Review of Systems  Constitutional: Negative.   Eyes: Negative.   Respiratory: Negative.   Cardiovascular: Negative.   Endocrine: Negative.   Musculoskeletal: Positive for arthralgias (left leg pain.).  Skin: Negative.   Neurological: Positive for numbness.  Psychiatric/Behavioral: Negative.       Objective:    Physical Exam HENT:     Head: Normocephalic and atraumatic.  Eyes:     Extraocular Movements: Extraocular movements intact.     Conjunctiva/sclera: Conjunctivae normal.     Pupils: Pupils are equal, round, and reactive to light.  Cardiovascular:     Rate and Rhythm: Normal rate and regular rhythm.     Pulses: Normal pulses.     Heart sounds: Normal heart sounds.  Pulmonary:     Effort: Pulmonary effort is normal.     Breath sounds: Normal breath sounds.  Musculoskeletal:        General: Tenderness (palpation of left leg from ankle to below the knee.) present.  Skin:    Comments: Callus to ball of right foot  Neurological:     General: No focal  deficit present.     Mental Status: She is alert and oriented to person, place, and time. Mental status is at baseline.  Psychiatric:        Mood and Affect: Mood normal.        Behavior: Behavior normal.        Thought Content: Thought content normal.        Judgment: Judgment normal.     BP [name])  143/86 (BP Location: Left Arm, Patient Position: Sitting, Cuff Size: Small)   Pulse 94   Temp 98.2 F (36.8 C) (Oral)   Resp 16   Ht 5\' 1"  (1.549 m)   Wt 171 lb 4.8 oz (77.7 kg)   SpO2 90%   BMI 32.37 kg/m  Wt Readings from Last 3 Encounters:  12/22/20 171 lb 4.8 oz (77.7 kg)  11/21/20 176 lb (79.8 kg)  11/17/20 176 lb (79.8 kg)     Health Maintenance Due  Topic Date Due  . Hepatitis C Screening  Never done  . PNEUMOCOCCAL POLYSACCHARIDE VACCINE AGE 66-64 HIGH RISK  Never done  . OPHTHALMOLOGY EXAM  Never done  . COVID-19 Vaccine (1) Never done  . HIV Screening  Never done  . TETANUS/TDAP  Never done  . PAP SMEAR-Modifier  Never done  . COLONOSCOPY (Pts 45-33yrs Insurance coverage will need to be confirmed)  Never done  . MAMMOGRAM  12/14/2020    There are no preventive care reminders to display for this patient.  Lab Results  Component Value Date   TSH 0.355 (L) 12/15/2020   Lab Results  Component Value Date   WBC 8.2 11/21/2020   HGB 14.6 11/21/2020   HCT 43.7 11/21/2020   MCV 90.5 11/21/2020   PLT 276 11/21/2020   Lab Results  Component Value Date   NA 137 11/21/2020   K 3.7 11/21/2020   CO2 25 11/21/2020   GLUCOSE 258 (H) 11/21/2020   BUN 11 11/21/2020   CREATININE 0.97 11/21/2020   BILITOT 0.3 07/28/2020   ALKPHOS 108 07/28/2020   AST 28 07/28/2020   ALT 37 (H) 07/28/2020   PROT 6.8 07/28/2020   ALBUMIN 4.4 07/28/2020   CALCIUM 9.3 11/21/2020   ANIONGAP 11 11/21/2020   Lab Results  Component Value Date   CHOL 179 07/28/2020   Lab Results  Component Value Date   HDL 46 07/28/2020   Lab Results  Component Value Date   LDLCALC 99 07/28/2020    Lab Results  Component Value Date   TRIG 199 (H) 07/28/2020   Lab Results  Component Value Date   CHOLHDL 4.0 03/16/2020   Lab Results  Component Value Date   HGBA1C 7.7 (H) 12/15/2020      Assessment & Plan:   1. Gout of multiple sites, unspecified cause, unspecified chronicity - Her gout is under control, will continue on current medication - allopurinol (ZYLOPRIM) 100 MG tablet; Take 1 tablet (100 mg total) by mouth daily.  Dispense: 90 tablet; Refill: 1  2. Hypothyroidism, unspecified type - Her TSH was 0.355, her Levothyroxine was decreased to 100 mcg due to palpitation, she was encouraged to take it 45 minutes prior to eating breakfast and will recheck TSH in 6 weeks. She was advised to go to the ED with worsening symptoms. - levothyroxine (SYNTHROID) 100 MCG tablet; Take 1 tablet 45 minutes before breakfast  Dispense: 60 tablet; Refill: 0 - TSH; Future  3. Type 2 diabetes mellitus with diabetic neuropathy, without long-term current use of insulin (HCC) - Her HgbA1c was 7.7%, her goal should be 7%, she will continue on current medication, low fat/non concentrated sweet diet. - Ambulatory referral to Ophthalmology - glipiZIDE (GLUCOTROL) 5 MG tablet; Take 1 tablet (5 mg total) by mouth daily.  Dispense: 30 tablet; Refill: 2 - Ambulatory referral to Podiatry  4. Acute leg pain, left - No edema, erythema to left leg, will take Ibuprofen as needed and encouraged to complete [facility] financial  application for Vascular surgery referral. - ibuprofen (ADVIL) 800 MG tablet; Take 1 tablet (800 mg total) by mouth 2 (two) times daily.  Dispense: 14 tablet; Refill: 0 - Ambulatory referral to Vascular Surgery  5. Healthcare maintenance  - Ambulatory referral to Gastroenterology for Colonoscopy and was provided with [facility] flyer for Mammogram and Pap smear.     Follow-up: Return in about 7 weeks (around 02/09/2021), or if symptoms worsen or fail to improve.    Amy Knapp Trellis PaganiniE Yvett Rossel,  NP

## 2020-12-22 NOTE — Patient Instructions (Signed)

## 2021-01-09 ENCOUNTER — Other Ambulatory Visit: Payer: Self-pay

## 2021-01-16 ENCOUNTER — Other Ambulatory Visit: Payer: Self-pay | Admitting: Gerontology

## 2021-01-16 ENCOUNTER — Other Ambulatory Visit: Payer: Self-pay

## 2021-01-16 ENCOUNTER — Other Ambulatory Visit: Payer: Self-pay | Admitting: Obstetrics and Gynecology

## 2021-01-16 DIAGNOSIS — J441 Chronic obstructive pulmonary disease with (acute) exacerbation: Secondary | ICD-10-CM

## 2021-01-16 DIAGNOSIS — E785 Hyperlipidemia, unspecified: Secondary | ICD-10-CM

## 2021-01-16 DIAGNOSIS — K219 Gastro-esophageal reflux disease without esophagitis: Secondary | ICD-10-CM

## 2021-01-16 DIAGNOSIS — G4700 Insomnia, unspecified: Secondary | ICD-10-CM

## 2021-01-16 DIAGNOSIS — E039 Hypothyroidism, unspecified: Secondary | ICD-10-CM

## 2021-01-16 MED FILL — Gabapentin Cap 400 MG: ORAL | 30 days supply | Qty: 60 | Fill #0 | Status: CN

## 2021-01-17 ENCOUNTER — Other Ambulatory Visit: Payer: Self-pay

## 2021-01-17 MED ORDER — TRAZODONE HCL 50 MG PO TABS
ORAL_TABLET | Freq: Every day | ORAL | 1 refills | Status: DC
  Filled 2021-01-17: qty 30, 30d supply, fill #0

## 2021-01-17 MED ORDER — LEVOTHYROXINE SODIUM 100 MCG PO TABS
ORAL_TABLET | ORAL | 0 refills | Status: DC
  Filled 2021-01-17: qty 60, 60d supply, fill #0

## 2021-01-17 MED ORDER — OMEPRAZOLE 20 MG PO CPDR
DELAYED_RELEASE_CAPSULE | Freq: Every day | ORAL | 0 refills | Status: DC
  Filled 2021-01-17: qty 60, 60d supply, fill #0
  Filled 2021-02-10: qty 14, 14d supply, fill #0
  Filled 2021-03-08: qty 30, 30d supply, fill #1
  Filled 2021-04-07: qty 16, 16d supply, fill #2

## 2021-01-17 MED ORDER — ATORVASTATIN CALCIUM 10 MG PO TABS
ORAL_TABLET | Freq: Every day | ORAL | 2 refills | Status: DC
Start: 2021-01-17 — End: 2021-02-09
  Filled 2021-01-17: qty 30, 90d supply, fill #0

## 2021-01-18 ENCOUNTER — Other Ambulatory Visit: Payer: Self-pay

## 2021-01-19 ENCOUNTER — Other Ambulatory Visit: Payer: Self-pay

## 2021-01-25 ENCOUNTER — Encounter: Payer: Self-pay | Admitting: *Deleted

## 2021-01-26 ENCOUNTER — Telehealth: Payer: Self-pay | Admitting: Pulmonary Disease

## 2021-01-26 ENCOUNTER — Other Ambulatory Visit: Payer: Self-pay

## 2021-01-26 DIAGNOSIS — E039 Hypothyroidism, unspecified: Secondary | ICD-10-CM

## 2021-01-27 ENCOUNTER — Ambulatory Visit: Payer: Self-pay | Admitting: Podiatry

## 2021-01-27 LAB — TSH: TSH: 2.79 u[IU]/mL (ref 0.450–4.500)

## 2021-01-30 ENCOUNTER — Telehealth: Payer: Self-pay | Admitting: Pulmonary Disease

## 2021-01-30 NOTE — Telephone Encounter (Signed)
ATC x1, left vm to return call. 

## 2021-01-30 NOTE — Telephone Encounter (Signed)
Pt appt rescheduled for 6/20 @ 4pm

## 2021-01-31 ENCOUNTER — Ambulatory Visit (INDEPENDENT_AMBULATORY_CARE_PROVIDER_SITE_OTHER): Payer: Self-pay | Admitting: Podiatry

## 2021-01-31 ENCOUNTER — Other Ambulatory Visit: Payer: Self-pay

## 2021-01-31 DIAGNOSIS — M79675 Pain in left toe(s): Secondary | ICD-10-CM

## 2021-01-31 DIAGNOSIS — M79674 Pain in right toe(s): Secondary | ICD-10-CM

## 2021-01-31 DIAGNOSIS — B351 Tinea unguium: Secondary | ICD-10-CM

## 2021-01-31 DIAGNOSIS — E0843 Diabetes mellitus due to underlying condition with diabetic autonomic (poly)neuropathy: Secondary | ICD-10-CM

## 2021-01-31 NOTE — Progress Notes (Signed)
   SUBJECTIVE Patient with a history of diabetes mellitus presents to office today complaining of elongated, thickened nails that cause pain while ambulating in shoes.  She is unable to trim her own nails. Patient is here for further evaluation and treatment.   Past Medical History:  Diagnosis Date  . Allergy   . Anxiety   . Arthritis   . Asthma   . Broken leg 2007   Right  . Cataract   . COPD (chronic obstructive pulmonary disease) (HCC)   . Depression   . Diabetes mellitus without complication (HCC)   . Emphysema of lung (HCC)   . GERD (gastroesophageal reflux disease)   . Gout   . Hyperlipidemia   . Sleep apnea   . Thyroid disease     OBJECTIVE General Patient is awake, alert, and oriented x 3 and in no acute distress. Derm Skin is dry and supple bilateral. Negative open lesions or macerations. Remaining integument unremarkable. Nails are tender, long, thickened and dystrophic with subungual debris, consistent with onychomycosis, 1-5 bilateral. No signs of infection noted. Vasc  DP and PT pedal pulses palpable bilaterally. Temperature gradient within normal limits.  Neuro Epicritic and protective threshold sensation diminished bilaterally.  Musculoskeletal Exam No symptomatic pedal deformities noted bilateral. Muscular strength within normal limits.  ASSESSMENT 1. Diabetes Mellitus w/ peripheral neuropathy 2. Onychomycosis of nail due to dermatophyte bilateral 3. Pain in foot bilateral  PLAN OF CARE 1. Patient evaluated today. 2. Instructed to maintain good pedal hygiene and foot care. Stressed importance of controlling blood sugar.  3. Mechanical debridement of nails 1-5 bilaterally performed using a nail nipper. Filed with dremel without incident.  4. Return to clinic in 3 mos.     Felecia Shelling, DPM [facility]  Dr. Felecia Shelling, DPM    [address] [place], [place] [address]                Office [phone]  Fax [phone]

## 2021-02-02 ENCOUNTER — Other Ambulatory Visit: Payer: Self-pay

## 2021-02-02 ENCOUNTER — Ambulatory Visit
Admission: RE | Admit: 2021-02-02 | Discharge: 2021-02-02 | Disposition: A | Discharge status: Home / Self Care | Payer: Self-pay | Source: Ambulatory Visit | Attending: Obstetrics and Gynecology | Admitting: Obstetrics and Gynecology

## 2021-02-02 DIAGNOSIS — E039 Hypothyroidism, unspecified: Secondary | ICD-10-CM | POA: Insufficient documentation

## 2021-02-02 DIAGNOSIS — R1310 Dysphagia, unspecified: Secondary | ICD-10-CM | POA: Insufficient documentation

## 2021-02-07 ENCOUNTER — Ambulatory Visit: Payer: Self-pay | Admitting: Pulmonary Disease

## 2021-02-09 ENCOUNTER — Ambulatory Visit: Payer: Self-pay | Admitting: Physician Assistant

## 2021-02-09 ENCOUNTER — Other Ambulatory Visit: Payer: Self-pay

## 2021-02-09 DIAGNOSIS — E039 Hypothyroidism, unspecified: Secondary | ICD-10-CM

## 2021-02-09 DIAGNOSIS — E1140 Type 2 diabetes mellitus with diabetic neuropathy, unspecified: Secondary | ICD-10-CM

## 2021-02-09 DIAGNOSIS — J449 Chronic obstructive pulmonary disease, unspecified: Secondary | ICD-10-CM

## 2021-02-09 DIAGNOSIS — M25562 Pain in left knee: Secondary | ICD-10-CM

## 2021-02-09 DIAGNOSIS — G4700 Insomnia, unspecified: Secondary | ICD-10-CM

## 2021-02-09 DIAGNOSIS — E785 Hyperlipidemia, unspecified: Secondary | ICD-10-CM

## 2021-02-09 MED ORDER — MONTELUKAST SODIUM 10 MG PO TABS
ORAL_TABLET | Freq: Every day | ORAL | 1 refills | Status: DC
  Filled 2021-02-09: qty 14, 14d supply, fill #0
  Filled 2021-03-08: qty 30, 30d supply, fill #1

## 2021-02-09 MED ORDER — IBUPROFEN 800 MG PO TABS
ORAL_TABLET | Freq: Two times a day (BID) | ORAL | 0 refills | Status: DC
  Filled 2021-02-09: qty 14, 7d supply, fill #0

## 2021-02-09 MED ORDER — LEVOTHYROXINE SODIUM 100 MCG PO TABS
ORAL_TABLET | ORAL | 0 refills | Status: DC
  Filled 2021-02-10: qty 14, 14d supply, fill #0
  Filled 2021-03-08: qty 30, 30d supply, fill #1

## 2021-02-09 MED ORDER — TRAZODONE HCL 50 MG PO TABS
ORAL_TABLET | Freq: Every day | ORAL | 1 refills | Status: DC
  Filled 2021-02-10: qty 14, 14d supply, fill #0
  Filled 2021-03-08: qty 30, 30d supply, fill #1

## 2021-02-09 MED ORDER — GABAPENTIN 400 MG PO CAPS
400.0000 mg | ORAL_CAPSULE | Freq: Three times a day (TID) | ORAL | 0 refills | Status: DC
  Filled 2021-02-09: qty 42, 14d supply, fill #0
  Filled 2021-03-08: qty 90, 30d supply, fill #1
  Filled 2021-04-07: qty 90, 30d supply, fill #2

## 2021-02-09 MED ORDER — ATORVASTATIN CALCIUM 10 MG PO TABS
ORAL_TABLET | Freq: Every day | ORAL | 2 refills | Status: DC
  Filled 2021-02-10: qty 14, 14d supply, fill #0
  Filled 2021-03-08: qty 30, 30d supply, fill #1

## 2021-02-09 NOTE — Progress Notes (Signed)
Patient: Amy Knapp Female    DOB: 1956-04-15   65 y.o.   MRN: [id number] Visit Date: 02/09/2021  Today's Provider: [facility]   Chief Complaint  Patient presents with  . Medication Refill  . Follow-up    On blood work    Subjective:    Amy Knapp presents for f/u on her lab work (TSH) and for medication refills.   Review of TSH shows vast majority of checks outside acceptable parameters (too high or too low), but most recent reading was within range at 2.79.   ------------------------------------- TSH results for approximately 2 years:  Ref. Range 01/28/2018 18:58 04/24/2018 18:23 07/31/2018 18:58 02/26/2019 18:11 11/04/2019 11:19 12/10/2019 19:04 07/28/2020 18:37 08/23/2020 18:27 11/03/2020 18:52 11/21/2020 20:54 12/15/2020 18:09 01/26/2021 18:05  TSH Latest Ref Range: 0.450 - 4.500 uIU/mL 0.043 (L) 0.377 (L) 33.820 (H) 3.840 20.300 (H) 4.040 6.810 (H) 11.200 (H) 9.120 (H) 7.248 (H) 0.355 (L) 2.790  -------------------------------------  ------------------------------------- U/S of thyroid (02/02/2021):  "No discrete nodules are seen within the thyroid gland.  IMPRESSION: Diffusely heterogeneous shrunken thyroid without discrete nodule.  The above is in keeping with the ACR TI-RADS recommendations - J Am Coll Radiol 2017;14:587-595." -------------------------------------  She describes several symptoms attributable to thyroid dysregulation (e.g., fatigue, weight gain, and hair loss). She does not think these symptoms have gotten better with the recent reduction in her levothyroxine. However, she does note that here previously-reported palpitations w/ associated chest discomfort has resolved since reducing the dose of levothyroxine.  She notes her arthralgic leg pain has flared up again. She was previously given a 2-week course of ibuprofen when this happened, and she reports that helped quite a lot. She also reports some increase in her neuropathy pain,  particularly at night. She notes sometimes the arthralgic and neuropathic pain coincide at night, which is particularly difficult for her.  She is down to smoking 3 cigs/day, for which I offered encouragement with continued counseling re: goal of complete cessation.  She has an appt to see GI re: her screening colo, and she also reported to them some previously-unreported dysphagia, for which they will further evaluate her in addition to her screening colo. She has not heard anything more about her mammo or Pap, but review of chart shows clinician initiated this at the previous visit.      Allergies  Allergen Reactions  . Celery Oil     itching  . Demerol [Meperidine]     Burning, feels hot  . Morphine And Related Other (See Comments)    Muscle Spasms  . Penicillins     blisters   Previous Medications   ALBUTEROL (VENTOLIN HFA) 108 (90 BASE) MCG/ACT INHALER    INHALE 2 PUFFS INTO THE LUNGS EVERY 6 HOURS AS NEEDED FOR WHEEZING OR SHORT OF BREATH   ALLOPURINOL (ZYLOPRIM) 100 MG TABLET    TAKE ONE TABLET BY MOUTH EVERY DAY   FLUTICASONE-UMECLIDIN-VILANT 100-62.5-25 MCG/INH AEPB    INHALE 1 PUFF INTO THE LUNGS EVERY DAY   GLIPIZIDE (GLUCOTROL) 5 MG TABLET    TAKE ONE TABLET BY MOUTH EVERY DAY   OMEPRAZOLE (PRILOSEC) 20 MG CAPSULE    TAKE ONE CAPSULE BY MOUTH EVERY DAY    Review of systems (may be based on changes from baseline unless otherwise stated) See HPI as well General: No fever, chills, diaphoresis, night sweats, anorexia Eyes: No worsening vision or other ocular symptoms (latter asked generically)  Ears/Nose/Throat: No deafness, ringing in ears/tinnitus, epistaxis, or other ENT  symptoms (latter asked generically) Respiratory: No SOB, DOE, cough, wheezing, hemoptysis, orthopnea, or PND Cardiovascular: No chest pain; chest pressure; pain or pressure in neck, arm, jaw, or epigastric area; palpitations; or edema Gastrointestinal: No nausea, vomiting, abdominal pain, diarrhea,  indigestion, or change in bowel habits or stool color Skin: No rash, itching, or new or concerning skin changes Neurologic: No headaches, syncope, numbness, weakness, "dizziness or lightheadedness"   Social History   Tobacco Use  . Smoking status: Current Every Day Smoker    Packs/day: 0.15    Types: Cigarettes  . Smokeless tobacco: Never Used  . Tobacco comment: 3/day 02/09/2021  Substance Use Topics  . Alcohol use: No   Objective:   BP 134/76   Pulse 85   Temp 98.1 F (36.7 C)   Ht 5' (1.524 m)   Wt 173 lb 1.6 oz (78.5 kg)   SpO2 98%   BMI 33.81 kg/m   Physical exam Constitutional: A&Ox3, NAD, pleasant, cooperative HEENT: [place]/AT, EOM grossly intact Respiratory: Breathing unlabored, good and symmetric chest expansion and air entry, lungs clear to auscultation bilaterally Cardiovascular: no JVD noted/JVP does not appear outside normal limits; RRR, S1, S2; no S3 or S4 appreciated; no murmurs, clicks, gallops, rubs Skin: Skin appearance, texture, temperature, and turgor free from worrisome characteristics or unexplained abnormalities Neuro: Nonfocal; sensation and motor function grossly normal, CNs 2-12 grossly intact     Assessment & Plan:     Hypothyroidism -Disc expected course, encouraged on measurement w/in acceptable limits, but need to continue to check closely given volatility of previous measurements -Cont on current dose of levothyroxine for now, with hopeful anticipation of improvement in aforementioned symptoms once her thyroid is consistently regulated  Neuropathic pain -Disc R/B/A of incr in gabapentin to 400 TID, and she would like to try this increased dose -Sent to pharmacy  Arthralgia -Disc R/B/A of another 14-day course of ibuprofen as was offered previously, and she would really like to have another 2 weeks of it to help, and she does not express desire for this to be long-term -If it becomes clear later that she has longer-term needs will need to  consider the fact she has diabetes in crafting a longer-term plan  Health maintenance -Refills on several medicines sent -Will ask front desk to ensure things are in motion for mammo and Pap  General -F/u in 6 weeks for further check of TSH (instructed to have it checked ~2 days prior to visit so it can be discussed during the visit) and symptoms related to thyroid dysfunction, as well as to f/u on neuropathic and arthralgic pain -Discussed s/s warranting further attn; advised to seek further care here or elsewhere via call or in person as warranted if these occurred or if any ?s or concerns       [facility]   [facility]

## 2021-02-10 ENCOUNTER — Other Ambulatory Visit: Payer: Self-pay

## 2021-02-10 ENCOUNTER — Telehealth: Payer: Self-pay | Admitting: Pulmonary Disease

## 2021-02-10 NOTE — Telephone Encounter (Addendum)
ATC patient for reminder of covid test prior to PFT.  Unable to leave vm. Received recording x2 that I was unable to  eave vm due to bad connection.  02/13/2021 at 1:[facility]

## 2021-02-13 ENCOUNTER — Other Ambulatory Visit
Admission: RE | Admit: 2021-02-13 | Discharge: 2021-02-13 | Disposition: A | Discharge status: Home / Self Care | Payer: Self-pay | Source: Ambulatory Visit | Attending: Pulmonary Disease | Admitting: Pulmonary Disease

## 2021-02-13 ENCOUNTER — Other Ambulatory Visit: Payer: Self-pay

## 2021-02-13 DIAGNOSIS — Z20822 Contact with and (suspected) exposure to covid-19: Secondary | ICD-10-CM | POA: Insufficient documentation

## 2021-02-13 DIAGNOSIS — Z01812 Encounter for preprocedural laboratory examination: Secondary | ICD-10-CM | POA: Insufficient documentation

## 2021-02-13 LAB — SARS CORONAVIRUS 2 (TAT 6-24 HRS): SARS Coronavirus 2: NEGATIVE

## 2021-02-13 NOTE — Telephone Encounter (Signed)
Patient is aware of date/time of covid test prior to PFT.  

## 2021-02-14 ENCOUNTER — Ambulatory Visit: Payer: Self-pay | Attending: Pulmonary Disease

## 2021-02-14 DIAGNOSIS — J449 Chronic obstructive pulmonary disease, unspecified: Secondary | ICD-10-CM | POA: Insufficient documentation

## 2021-02-14 MED ORDER — ALBUTEROL SULFATE (2.5 MG/3ML) 0.083% IN NEBU
2.5000 mg | INHALATION_SOLUTION | Freq: Once | RESPIRATORY_TRACT | Status: AC
  Administered 2021-02-14: 2.5 mg via RESPIRATORY_TRACT
  Filled 2021-02-14: qty 3

## 2021-03-08 ENCOUNTER — Other Ambulatory Visit: Payer: Self-pay

## 2021-03-16 ENCOUNTER — Other Ambulatory Visit: Payer: Self-pay

## 2021-03-16 ENCOUNTER — Encounter (INDEPENDENT_AMBULATORY_CARE_PROVIDER_SITE_OTHER): Payer: Self-pay

## 2021-03-16 ENCOUNTER — Telehealth: Payer: Self-pay | Admitting: Licensed Clinical Social Worker

## 2021-03-16 DIAGNOSIS — E039 Hypothyroidism, unspecified: Secondary | ICD-10-CM

## 2021-03-16 NOTE — Telephone Encounter (Signed)
Called the patient to schedule a 60 minute telephone follow-up visit. No answer; left voicemail with clinic contact information  

## 2021-03-17 LAB — TSH: TSH: 1.87 u[IU]/mL (ref 0.450–4.500)

## 2021-03-21 ENCOUNTER — Ambulatory Visit (INDEPENDENT_AMBULATORY_CARE_PROVIDER_SITE_OTHER): Payer: Self-pay | Admitting: Gastroenterology

## 2021-03-21 ENCOUNTER — Encounter: Payer: Self-pay | Admitting: Gastroenterology

## 2021-03-21 ENCOUNTER — Other Ambulatory Visit: Payer: Self-pay

## 2021-03-21 VITALS — BP 135/72 | HR 80 | Ht 61.0 in | Wt 172.8 lb

## 2021-03-21 DIAGNOSIS — R1310 Dysphagia, unspecified: Secondary | ICD-10-CM

## 2021-03-21 DIAGNOSIS — R1319 Other dysphagia: Secondary | ICD-10-CM

## 2021-03-21 DIAGNOSIS — R197 Diarrhea, unspecified: Secondary | ICD-10-CM

## 2021-03-21 MED ORDER — PEG 3350-KCL-NABCB-NACL-NASULF 236 G PO SOLR
ORAL | 0 refills | Status: DC
  Filled 2021-03-21 – 2021-03-31 (×2): qty 4000, fill #0
  Filled 2021-04-07: qty 4000, 1d supply, fill #0

## 2021-03-21 NOTE — Progress Notes (Signed)
This patient reports that she h   Gastroenterology Consultation  Referring Provider:     Iloabachie, Chioma E, NP Primary Care Physician:  System, Provider Not In Primary Gastroenterologist:  Dr. Annalis Kaczmarczyk     Reason for Consultation:     dysphagia and diarrhea        HPI:   Amy Knapp is a 64 y.o. y/o female referred for consultation & management of dysphagia and diarrhea by Dr. System, Provider Not In.  This patient reports that she is been having problems with dysphagia and diarrhea.  The patient states that this has been going on for at least 5 months.  She does report that she had an EGD and colonoscopy over 15 years ago. She denies any unexplained weight loss fevers chills nausea or vomiting.  She reports that the dysphagia is mostly to bulky foods.  She denies any black stools or bloody stools but does state that when she has a lot of diarrhea issue can have some resulting bright red blood on the toilet paper.  There is no family history of any first-degree relatives with colon cancer colon polyps but she does report a distant relative with colon cancer in her 70s. The patient was seen by her primary care provider back in March and given allopurinol for gout. The patient does report some lower abdominal pain on both sides.  Past Medical History:  Diagnosis Date   Allergy    Anxiety    Arthritis    Asthma    Broken leg 2007   Right   Cataract    COPD (chronic obstructive pulmonary disease) (HCC)    Depression    Diabetes mellitus without complication (HCC)    Emphysema of lung (HCC)    GERD (gastroesophageal reflux disease)    Gout    Hyperlipidemia    Sleep apnea    Thyroid disease     Past Surgical History:  Procedure Laterality Date   ABDOMINAL HYSTERECTOMY  2000   Partial   LEG SURGERY Right 10/08/2005   broken right leg   TUBAL LIGATION      Prior to Admission medications   Medication Sig Start Date End Date Taking? Authorizing Provider  albuterol  (VENTOLIN HFA) 108 (90 Base) MCG/ACT inhaler INHALE 2 PUFFS INTO THE LUNGS EVERY 6 HOURS AS NEEDED FOR WHEEZING OR SHORT OF BREATH 08/09/20 08/09/21 Yes Abate, Desta A, NP  allopurinol (ZYLOPRIM) 100 MG tablet TAKE ONE TABLET BY MOUTH EVERY DAY 12/22/20 05/07/21 Yes Iloabachie, Chioma E, NP  atorvastatin (LIPITOR) 10 MG tablet TAKE ONE TABLET BY MOUTH EVERY DAY 02/09/21 01/07/22 Yes Mayer, Martin G, PA-C  Fluticasone-Umeclidin-Vilant 100-62.5-25 MCG/INH AEPB INHALE 1 PUFF INTO THE LUNGS EVERY DAY 12/20/20 12/20/21 Yes Iloabachie, Chioma E, NP  gabapentin (NEURONTIN) 400 MG capsule Take 1 capsule (400 mg total) by mouth 3 (three) times daily. 02/09/21 05/10/21 Yes Mayer, Martin G, PA-C  glipiZIDE (GLUCOTROL) 5 MG tablet TAKE ONE TABLET BY MOUTH EVERY DAY 12/22/20 12/22/21 Yes Iloabachie, Chioma E, NP  ibuprofen (ADVIL) 800 MG tablet TAKE ONE TABLET BY MOUTH 2 TIMES A DAY 02/09/21 02/09/22 Yes Mayer, Martin G, PA-C  levothyroxine (SYNTHROID) 100 MCG tablet TAKE ONE TABLET BY MOUTH 45 MINUTES BEFORE BREAKFAST. 02/09/21 07/26/21 Yes Mayer, Martin G, PA-C  montelukast (SINGULAIR) 10 MG tablet TAKE ONE TABLET BY MOUTH AT BEDTIME 02/09/21 02/09/22 Yes Mayer, Martin G, PA-C  omeprazole (PRILOSEC) 20 MG capsule TAKE ONE CAPSULE BY MOUTH EVERY DAY 01/17/21 08/01/21 Yes Iloabachie, Chioma E,   NP  traZODone (DESYREL) 50 MG tablet TAKE ONE TABLET BY MOUTH AT BEDTIME 02/09/21 04/13/21 Yes Mariane Duval, PA-C    Family History  Problem Relation Age of Onset   Cancer Mother    Heart attack Father    Heart attack Sister    Cancer Brother    Diabetes Daughter    Diabetes Son    Hypertension Maternal Aunt    Breast cancer Neg Hx      Social History   Tobacco Use   Smoking status: Every Day    Packs/day: 0.15    Pack years: 0.00    Types: Cigarettes   Smokeless tobacco: Never   Tobacco comments:    3/day 02/09/2021  Vaping Use   Vaping Use: Never used  Substance Use Topics   Alcohol use: No   Drug use: No    Allergies as of  03/21/2021 - Review Complete 03/21/2021  Allergen Reaction Noted   Celery oil  02/04/2014   Demerol [meperidine]  12/09/2014   Morphine and related Other (See Comments) 12/09/2014   Penicillins  02/21/2016    Review of Systems:    All systems reviewed and negative except where noted in HPI.   Physical Exam:  BP 135/72   Pulse 80   Ht 5\' 1"  (1.549 m)   Wt 172 lb 12.8 oz (78.4 kg)   BMI 32.65 kg/m  No LMP recorded. Patient is postmenopausal. General:   Alert,  Well-developed, well-nourished, pleasant and cooperative in NAD Head:  Normocephalic and atraumatic. Eyes:  Sclera clear, no icterus.   Conjunctiva pink. Ears:  Normal auditory acuity. Neck:  Supple; no masses or thyromegaly. Lungs:  Respirations even and unlabored.  Clear throughout to auscultation.   No wheezes, crackles, or rhonchi. No acute distress. Heart:  Regular rate and rhythm; no murmurs, clicks, rubs, or gallops. Abdomen:  Normal bowel sounds.  No bruits.  Soft, Mild tenderness in the lower abdomen and non-distended without masses, hepatosplenomegaly or hernias noted.  No guarding or rebound tenderness.  Negative Carnett sign.   Rectal:  Deferred.  Pulses:  Normal pulses noted. Extremities:  No clubbing or edema.  No cyanosis. Neurologic:  Alert and oriented x3;  grossly normal neurologically. Skin:  Intact without significant lesions or rashes.  No jaundice. Lymph Nodes:  No significant cervical adenopathy. Psych:  Alert and cooperative. Normal mood and affect.  Imaging Studies: No results found.  Assessment and Plan:   Amy Knapp is a 65 y.o. y/o female who comes in today with a history of dysphagia and diarrhea. The patient will be set up for an EGD and colonoscopy. This will be done due to her  history of dysphagia and diarrhea.  The patient has been explained the plan and agrees with it.    70, MD. Midge Minium    Note: This dictation was prepared with Dragon dictation along with  smaller phrase technology. Any transcriptional errors that result from this process are unintentional.

## 2021-03-21 NOTE — H&P (View-Only) (Signed)
This patient reports that she h   Gastroenterology Consultation  Referring Provider:     Rolm Gala, NP Primary Care Physician:  System, Provider Not In Primary Gastroenterologist:  Dr. Servando Snare     Reason for Consultation:     dysphagia and diarrhea        HPI:   Amy Knapp is a 65 y.o. y/o female referred for consultation & management of dysphagia and diarrhea by Dr. Cynda Familia, Provider Not In.  This patient reports that she is been having problems with dysphagia and diarrhea.  The patient states that this has been going on for at least 5 months.  She does report that she had an EGD and colonoscopy over 15 years ago. She denies any unexplained weight loss fevers chills nausea or vomiting.  She reports that the dysphagia is mostly to bulky foods.  She denies any black stools or bloody stools but does state that when she has a lot of diarrhea issue can have some resulting bright red blood on the toilet paper.  There is no family history of any first-degree relatives with colon cancer colon polyps but she does report a distant relative with colon cancer in her 2s. The patient was seen by her primary care provider back in March and given allopurinol for gout. The patient does report some lower abdominal pain on both sides.  Past Medical History:  Diagnosis Date   Allergy    Anxiety    Arthritis    Asthma    Broken leg 2007   Right   Cataract    COPD (chronic obstructive pulmonary disease) (HCC)    Depression    Diabetes mellitus without complication (HCC)    Emphysema of lung (HCC)    GERD (gastroesophageal reflux disease)    Gout    Hyperlipidemia    Sleep apnea    Thyroid disease     Past Surgical History:  Procedure Laterality Date   ABDOMINAL HYSTERECTOMY  2000   Partial   LEG SURGERY Right 10/08/2005   broken right leg   TUBAL LIGATION      Prior to Admission medications   Medication Sig Start Date End Date Taking? Authorizing Provider  albuterol  (VENTOLIN HFA) 108 (90 Base) MCG/ACT inhaler INHALE 2 PUFFS INTO THE LUNGS EVERY 6 HOURS AS NEEDED FOR WHEEZING OR SHORT OF BREATH 08/09/20 08/09/21 Yes Abate, Desta A, NP  allopurinol (ZYLOPRIM) 100 MG tablet TAKE ONE TABLET BY MOUTH EVERY DAY 12/22/20 05/07/21 Yes Iloabachie, Chioma E, NP  atorvastatin (LIPITOR) 10 MG tablet TAKE ONE TABLET BY MOUTH EVERY DAY 02/09/21 01/07/22 Yes Mariane Duval, PA-C  Fluticasone-Umeclidin-Vilant 100-62.5-25 MCG/INH AEPB INHALE 1 PUFF INTO THE LUNGS EVERY DAY 12/20/20 12/20/21 Yes Iloabachie, Chioma E, NP  gabapentin (NEURONTIN) 400 MG capsule Take 1 capsule (400 mg total) by mouth 3 (three) times daily. 02/09/21 05/10/21 Yes Mariane Duval, PA-C  glipiZIDE (GLUCOTROL) 5 MG tablet TAKE ONE TABLET BY MOUTH EVERY DAY 12/22/20 12/22/21 Yes Iloabachie, Chioma E, NP  ibuprofen (ADVIL) 800 MG tablet TAKE ONE TABLET BY MOUTH 2 TIMES A DAY 02/09/21 02/09/22 Yes Mariane Duval, PA-C  levothyroxine (SYNTHROID) 100 MCG tablet TAKE ONE TABLET BY MOUTH 45 MINUTES BEFORE BREAKFAST. 02/09/21 07/26/21 Yes Mariane Duval, PA-C  montelukast (SINGULAIR) 10 MG tablet TAKE ONE TABLET BY MOUTH AT BEDTIME 02/09/21 02/09/22 Yes Mariane Duval, PA-C  omeprazole (PRILOSEC) 20 MG capsule TAKE ONE CAPSULE BY MOUTH EVERY DAY 01/17/21 08/01/21 Yes Iloabachie, Chioma E,  NP  traZODone (DESYREL) 50 MG tablet TAKE ONE TABLET BY MOUTH AT BEDTIME 02/09/21 04/13/21 Yes Mariane Duval, PA-C    Family History  Problem Relation Age of Onset   Cancer Mother    Heart attack Father    Heart attack Sister    Cancer Brother    Diabetes Daughter    Diabetes Son    Hypertension Maternal Aunt    Breast cancer Neg Hx      Social History   Tobacco Use   Smoking status: Every Day    Packs/day: 0.15    Pack years: 0.00    Types: Cigarettes   Smokeless tobacco: Never   Tobacco comments:    3/day 02/09/2021  Vaping Use   Vaping Use: Never used  Substance Use Topics   Alcohol use: No   Drug use: No    Allergies as of  03/21/2021 - Review Complete 03/21/2021  Allergen Reaction Noted   Celery oil  02/04/2014   Demerol [meperidine]  12/09/2014   Morphine and related Other (See Comments) 12/09/2014   Penicillins  02/21/2016    Review of Systems:    All systems reviewed and negative except where noted in HPI.   Physical Exam:  BP 135/72   Pulse 80   Ht 5\' 1"  (1.549 m)   Wt 172 lb 12.8 oz (78.4 kg)   BMI 32.65 kg/m  No LMP recorded. Patient is postmenopausal. General:   Alert,  Well-developed, well-nourished, pleasant and cooperative in NAD Head:  Normocephalic and atraumatic. Eyes:  Sclera clear, no icterus.   Conjunctiva pink. Ears:  Normal auditory acuity. Neck:  Supple; no masses or thyromegaly. Lungs:  Respirations even and unlabored.  Clear throughout to auscultation.   No wheezes, crackles, or rhonchi. No acute distress. Heart:  Regular rate and rhythm; no murmurs, clicks, rubs, or gallops. Abdomen:  Normal bowel sounds.  No bruits.  Soft, Mild tenderness in the lower abdomen and non-distended without masses, hepatosplenomegaly or hernias noted.  No guarding or rebound tenderness.  Negative Carnett sign.   Rectal:  Deferred.  Pulses:  Normal pulses noted. Extremities:  No clubbing or edema.  No cyanosis. Neurologic:  Alert and oriented x3;  grossly normal neurologically. Skin:  Intact without significant lesions or rashes.  No jaundice. Lymph Nodes:  No significant cervical adenopathy. Psych:  Alert and cooperative. Normal mood and affect.  Imaging Studies: No results found.  Assessment and Plan:   Amy Knapp is a 65 y.o. y/o female who comes in today with a history of dysphagia and diarrhea. The patient will be set up for an EGD and colonoscopy. This will be done due to her  history of dysphagia and diarrhea.  The patient has been explained the plan and agrees with it.    68, MD. Midge Minium    Note: This dictation was prepared with Dragon dictation along with  smaller phrase technology. Any transcriptional errors that result from this process are unintentional.

## 2021-03-23 ENCOUNTER — Ambulatory Visit: Payer: Self-pay | Admitting: Gerontology

## 2021-03-23 ENCOUNTER — Other Ambulatory Visit: Payer: Self-pay

## 2021-03-23 VITALS — BP 119/77 | HR 77 | Resp 16 | Wt 172.4 lb

## 2021-03-23 DIAGNOSIS — M25562 Pain in left knee: Secondary | ICD-10-CM

## 2021-03-23 DIAGNOSIS — E1140 Type 2 diabetes mellitus with diabetic neuropathy, unspecified: Secondary | ICD-10-CM

## 2021-03-23 DIAGNOSIS — M25561 Pain in right knee: Secondary | ICD-10-CM | POA: Insufficient documentation

## 2021-03-23 DIAGNOSIS — G8929 Other chronic pain: Secondary | ICD-10-CM

## 2021-03-23 DIAGNOSIS — E785 Hyperlipidemia, unspecified: Secondary | ICD-10-CM

## 2021-03-23 DIAGNOSIS — J449 Chronic obstructive pulmonary disease, unspecified: Secondary | ICD-10-CM

## 2021-03-23 DIAGNOSIS — E039 Hypothyroidism, unspecified: Secondary | ICD-10-CM

## 2021-03-23 DIAGNOSIS — G4700 Insomnia, unspecified: Secondary | ICD-10-CM

## 2021-03-23 DIAGNOSIS — K219 Gastro-esophageal reflux disease without esophagitis: Secondary | ICD-10-CM

## 2021-03-23 LAB — GLUCOSE, POCT (MANUAL RESULT ENTRY): POC Glucose: 116 mg/dl — AB (ref 70–99)

## 2021-03-23 LAB — POCT GLYCOSYLATED HEMOGLOBIN (HGB A1C)
HbA1c POC (<> result, manual entry): 7.1 % (ref 4.0–5.6)
Hemoglobin A1C: 7.1 % — AB (ref 4.0–5.6)

## 2021-03-23 MED ORDER — MONTELUKAST SODIUM 10 MG PO TABS
ORAL_TABLET | Freq: Every day | ORAL | 1 refills | Status: DC
  Filled 2021-03-23: qty 30, fill #0
  Filled 2021-04-07: qty 60, 60d supply, fill #0

## 2021-03-23 MED ORDER — MONTELUKAST SODIUM 10 MG PO TABS
ORAL_TABLET | Freq: Every day | ORAL | 1 refills | Status: DC
  Filled 2021-03-23: qty 30, fill #0

## 2021-03-23 MED ORDER — ATORVASTATIN CALCIUM 10 MG PO TABS
ORAL_TABLET | Freq: Every day | ORAL | 2 refills | Status: DC
  Filled 2021-03-23: qty 30, fill #0
  Filled 2021-04-07: qty 90, 90d supply, fill #0

## 2021-03-23 MED ORDER — GLIPIZIDE 5 MG PO TABS
ORAL_TABLET | Freq: Every day | ORAL | 2 refills | Status: DC
  Filled 2021-03-23: qty 30, fill #0
  Filled 2021-03-24: qty 30, 30d supply, fill #0
  Filled 2021-05-12: qty 30, 30d supply, fill #1
  Filled 2021-06-16: qty 30, 30d supply, fill #2

## 2021-03-23 MED ORDER — TRAZODONE HCL 50 MG PO TABS
ORAL_TABLET | Freq: Every day | ORAL | 1 refills | Status: DC
  Filled 2021-03-23: qty 30, fill #0
  Filled 2021-04-07: qty 30, 30d supply, fill #0
  Filled 2021-05-12: qty 30, 30d supply, fill #1

## 2021-03-23 MED ORDER — IBUPROFEN 800 MG PO TABS
ORAL_TABLET | Freq: Two times a day (BID) | ORAL | 0 refills | Status: DC
  Filled 2021-03-23: qty 28, 14d supply, fill #0

## 2021-03-23 MED ORDER — LEVOTHYROXINE SODIUM 100 MCG PO TABS
ORAL_TABLET | ORAL | 3 refills | Status: DC
  Filled 2021-03-23: qty 60, fill #0
  Filled 2021-04-07: qty 30, 30d supply, fill #0
  Filled 2021-05-12: qty 30, 30d supply, fill #1
  Filled 2021-06-16: qty 30, 30d supply, fill #2
  Filled 2021-07-19: qty 30, 30d supply, fill #3
  Filled 2021-08-18: qty 30, 30d supply, fill #4

## 2021-03-23 NOTE — Progress Notes (Signed)
Established Patient Office Visit  Subjective:  Patient ID: Amy Knapp, female    DOB: Feb 07, 1956  Age: 65 y.o. MRN: [id number]  CC: No chief complaint on file.   HPI Amy Knapp is a 65 y/o female who has history of  Anxiety, Arthritis, Asthma, COPD, Depression, T2DM, GERD, Gout, Hyperlipidemia, Hypothyroidism, presents for routine follow up, lab review and medication refill. Her TSH done on 03/16/21 was 1.870, she continues on 100 mcg of Levothyroxine, and she denies cold/heat intolerance, constipation and weight gain. Her HgbA1c done today decreased from 7.7% to 7.1%, she reports checking her fasting blood glucose daily and it was 126 mg/dl this morning. Her blood glucose was 116 mg/dl during visit. She's compliant with her medications and continues to adhere to ADA diet. She denies hypo/hyperglycemic symptoms, and peripheral neuropathy is under control with taking gabapentin and she performs daily foot checks. She reports that she continues to experience constant non traumatic non radiating sharp 9/10 pain to  her right knee. She reports that it's chronic but it's worsening. She reports that walking and standing aggravates symptoms and 800 mg Ibuprofen minimally relieve symptoms. Overall, she states that she's doing well, but concerned about her right knee pain.  Past Medical History:  Diagnosis Date   Allergy    Anxiety    Arthritis    Asthma    Broken leg 2007   Right   Cataract    COPD (chronic obstructive pulmonary disease) (HCC)    Depression    Diabetes mellitus without complication (HCC)    Emphysema of lung (HCC)    GERD (gastroesophageal reflux disease)    Gout    Hyperlipidemia    Sleep apnea    Thyroid disease     Past Surgical History:  Procedure Laterality Date   ABDOMINAL HYSTERECTOMY  2000   Partial   LEG SURGERY Right 10/08/2005   broken right leg   TUBAL LIGATION      Family History  Problem Relation Age of Onset   Cancer Mother     Heart attack Father    Heart attack Sister    Cancer Brother    Diabetes Daughter    Diabetes Son    Hypertension Maternal Aunt    Breast cancer Neg Hx     Social History   Socioeconomic History   Marital status: Divorced    Spouse name: Not on file   Number of children: 4   Years of education: Not on file   Highest education level: High school graduate  Occupational History   Occupation: Unemployed  Tobacco Use   Smoking status: Every Day    Packs/day: 0.15    Pack years: 0.00    Types: Cigarettes   Smokeless tobacco: Never   Tobacco comments:    3/day 02/09/2021  Vaping Use   Vaping Use: Never used  Substance and Sexual Activity   Alcohol use: No   Drug use: No   Sexual activity: Not Currently  Other Topics Concern   Not on file  Social History Narrative   Not on file   Social Determinants of Health   Financial Resource Strain: Not on file  Food Insecurity: Not on file  Transportation Needs: Not on file  Physical Activity: Not on file  Stress: Not on file  Social Connections: Not on file  Intimate Partner Violence: Not on file    Outpatient Medications Prior to Visit  Medication Sig Dispense Refill   albuterol (VENTOLIN HFA) 108 (90  Base) MCG/ACT inhaler INHALE 2 PUFFS INTO THE LUNGS EVERY 6 HOURS AS NEEDED FOR WHEEZING OR SHORT OF BREATH 54 g 2   allopurinol (ZYLOPRIM) 100 MG tablet TAKE ONE TABLET BY MOUTH EVERY DAY 90 tablet 1   Fluticasone-Umeclidin-Vilant 100-62.5-25 MCG/INH AEPB INHALE 1 PUFF INTO THE LUNGS EVERY DAY 60 each 3   gabapentin (NEURONTIN) 400 MG capsule Take 1 capsule (400 mg total) by mouth 3 (three) times daily. 270 capsule 0   omeprazole (PRILOSEC) 20 MG capsule TAKE ONE CAPSULE BY MOUTH EVERY DAY 60 capsule 0   atorvastatin (LIPITOR) 10 MG tablet TAKE ONE TABLET BY MOUTH EVERY DAY 30 tablet 2   glipiZIDE (GLUCOTROL) 5 MG tablet TAKE ONE TABLET BY MOUTH EVERY DAY 30 tablet 2   levothyroxine (SYNTHROID) 100 MCG tablet TAKE ONE TABLET  BY MOUTH 45 MINUTES BEFORE BREAKFAST. 60 tablet 0   montelukast (SINGULAIR) 10 MG tablet TAKE ONE TABLET BY MOUTH AT BEDTIME 30 tablet 1   traZODone (DESYREL) 50 MG tablet TAKE ONE TABLET BY MOUTH AT BEDTIME 30 tablet 1   polyethylene glycol-electrolytes (GAVILYTE-N WITH FLAVOR PACK) 420 g solution Drink one 8 oz glass every 20 mins until entire container is finished starting at 5:00pm on 04/10/21 4000 mL 0   ibuprofen (ADVIL) 800 MG tablet TAKE ONE TABLET BY MOUTH 2 TIMES A DAY (Patient not taking: Reported on 03/23/2021) 14 tablet 0   No facility-administered medications prior to visit.    Allergies  Allergen Reactions   Celery Oil     itching   Demerol [Meperidine]     Burning, feels hot   Morphine And Related Other (See Comments)    Muscle Spasms   Penicillins     blisters    ROS Review of Systems  Constitutional: Negative.   Respiratory: Negative.    Cardiovascular: Negative.   Endocrine: Negative.   Musculoskeletal:  Positive for arthralgias (chronic right knee pain).  Skin: Negative.   Neurological: Negative.   Psychiatric/Behavioral: Negative.       Objective:    Physical Exam HENT:     Head: Normocephalic and atraumatic.  Eyes:     Extraocular Movements: Extraocular movements intact.     Conjunctiva/sclera: Conjunctivae normal.     Pupils: Pupils are equal, round, and reactive to light.  Cardiovascular:     Rate and Rhythm: Normal rate and regular rhythm.     Pulses: Normal pulses.     Heart sounds: Normal heart sounds.  Pulmonary:     Effort: Pulmonary effort is normal.     Breath sounds: Normal breath sounds.  Musculoskeletal:        General: Tenderness (with palpation to right knee) present.  Skin:    General: Skin is warm.  Neurological:     General: No focal deficit present.     Mental Status: She is alert and oriented to person, place, and time. Mental status is at baseline.  Psychiatric:        Mood and Affect: Mood normal.        Behavior:  Behavior normal.        Thought Content: Thought content normal.        Judgment: Judgment normal.    BP 119/77 (BP Location: Left Arm, Patient Position: Sitting, Cuff Size: Large)   Pulse 77   Resp 16   Wt 172 lb 6.4 oz (78.2 kg)   SpO2 93%   BMI 32.57 kg/m  Wt Readings from Last 3 Encounters:  03/23/21 172  lb 6.4 oz (78.2 kg)  03/21/21 172 lb 12.8 oz (78.4 kg)  02/09/21 173 lb 1.6 oz (78.5 kg)   Encouraged weight loss  Health Maintenance Due  Topic Date Due   PNEUMOCOCCAL POLYSACCHARIDE VACCINE AGE 11-64 HIGH RISK  Never done   COVID-19 Vaccine (1) Never done   Pneumococcal Vaccine 790-63 Years old (1 - PCV) Never done   OPHTHALMOLOGY EXAM  Never done   HIV Screening  Never done   Hepatitis C Screening  Never done   TETANUS/TDAP  Never done   Zoster Vaccines- Shingrix (1 of 2) Never done   PAP SMEAR-Modifier  Never done   COLONOSCOPY (Pts 45-2368yrs Insurance coverage will need to be confirmed)  Never done   MAMMOGRAM  12/14/2020    There are no preventive care reminders to display for this patient.  Lab Results  Component Value Date   TSH 1.870 03/16/2021   Lab Results  Component Value Date   WBC 8.2 11/21/2020   HGB 14.6 11/21/2020   HCT 43.7 11/21/2020   MCV 90.5 11/21/2020   PLT 276 11/21/2020   Lab Results  Component Value Date   NA 137 11/21/2020   K 3.7 11/21/2020   CO2 25 11/21/2020   GLUCOSE 258 (H) 11/21/2020   BUN 11 11/21/2020   CREATININE 0.97 11/21/2020   BILITOT 0.3 07/28/2020   ALKPHOS 108 07/28/2020   AST 28 07/28/2020   ALT 37 (H) 07/28/2020   PROT 6.8 07/28/2020   ALBUMIN 4.4 07/28/2020   CALCIUM 9.3 11/21/2020   ANIONGAP 11 11/21/2020   Lab Results  Component Value Date   CHOL 179 07/28/2020   Lab Results  Component Value Date   HDL 46 07/28/2020   Lab Results  Component Value Date   LDLCALC 99 07/28/2020   Lab Results  Component Value Date   TRIG 199 (H) 07/28/2020   Lab Results  Component Value Date   CHOLHDL 4.0  03/16/2020   Lab Results  Component Value Date   HGBA1C 7.1 (A) 03/23/2021   HGBA1C 7.1 03/23/2021      Assessment & Plan:    1. Hyperlipidemia, unspecified hyperlipidemia type -She will continue current medication, low fat/cholesterol diet. - atorvastatin (LIPITOR) 10 MG tablet; TAKE ONE TABLET BY MOUTH EVERY DAY  Dispense: 30 tablet; Refill: 2  2. Type 2 diabetes mellitus with diabetic neuropathy, without long-term current use of insulin (HCC) -Her Diabetes is improving, Her HgbA1c was 7.1%. She will continue on current medication, low carb/non concentrated sweet diet. - glipiZIDE (GLUCOTROL) 5 MG tablet; TAKE ONE TABLET BY MOUTH EVERY DAY  Dispense: 30 tablet; Refill: 2 -C - Urine Microalbumin w/creat. ratio; Future - Urine Microalbumin w/creat. ratio  3. Arthralgia of left leg -She will continue on Ibuprofen as needed and [facility] Dr Justice RocherFossier. 4. Hypothyroidism, unspecified type -She is Euthyroid, and will continue on 100 mcg Levothyroxine. - levothyroxine (SYNTHROID) 100 MCG tablet; TAKE ONE TABLET BY MOUTH 45 MINUTES BEFORE BREAKFAST.  Dispense: 60 tablet; Refill: 3  5. Chronic obstructive asthma (HCC) -She will continue on current medication. - montelukast (SINGULAIR) 10 MG tablet; TAKE ONE TABLET BY MOUTH AT BEDTIME  Dispense: 30 tablet; Refill: 1   6. Chronic pain of right knee -[facility] Dr Justice RocherFossier. - ibuprofen (ADVIL) 800 MG tablet; TAKE ONE TABLET BY MOUTH 2 TIMES A DAY  Dispense: 28 tablet; Refill: 0  7. Insomnia, unspecified type -She will continue  on current medication and follow up with [facility] Ms. Rhett Bannister. - traZODone (DESYREL) 50 MG tablet; TAKE ONE TABLET BY MOUTH AT BEDTIME  Dispense: 30 tablet; Refill: 1     Follow-up: Return in about 4 weeks (around 04/20/2021), or if symptoms worsen or fail to improve.    Tom Macpherson Trellis Paganini, NP

## 2021-03-23 NOTE — Patient Instructions (Signed)
https://www.diabeteseducator.org/docs/default-source/living-with-diabetes/conquering-the-grocery-store-v1.pdf?sfvrsn=4">  Carbohydrate Counting for Diabetes Mellitus, Adult Carbohydrate counting is a method of keeping track of how many carbohydrates you eat. Eating carbohydrates naturally increases the amount of sugar (glucose) in the blood. Counting how many carbohydrates you eat improves your bloodglucose control, which helps you manage your diabetes. It is important to know how many carbohydrates you can safely have in each meal. This is different for every person. A dietitian can help you make a meal plan and calculate how many carbohydrates you should have at each meal andsnack. What foods contain carbohydrates? Carbohydrates are found in the following foods: Grains, such as breads and cereals. Dried beans and soy products. Starchy vegetables, such as potatoes, peas, and corn. Fruit and fruit juices. Milk and yogurt. Sweets and snack foods, such as cake, cookies, candy, chips, and soft drinks. How do I count carbohydrates in foods? There are two ways to count carbohydrates in food. You can read food labels or learn standard serving sizes of foods. You can use either of the methods or acombination of both. Using the Nutrition Facts label The Nutrition Facts list is included on the labels of almost all packaged foods and beverages in the [place] It includes: The serving size. Information about nutrients in each serving, including the grams (g) of carbohydrate per serving. To use the Nutrition Facts: Decide how many servings you will have. Multiply the number of servings by the number of carbohydrates per serving. The resulting number is the total amount of carbohydrates that you will be having. Learning the standard serving sizes of foods When you eat carbohydrate foods that are not packaged or do not include Nutrition Facts on the label, you need to measure the servings in order to count the  amount of carbohydrates. Measure the foods that you will eat with a food scale or measuring cup, if needed. Decide how many standard-size servings you will eat. Multiply the number of servings by 15. For foods that contain carbohydrates, one serving equals 15 g of carbohydrates. For example, if you eat 2 cups or 10 oz (300 g) of strawberries, you will have eaten 2 servings and 30 g of carbohydrates (2 servings x 15 g = 30 g). For foods that have more than one food mixed, such as soups and casseroles, you must count the carbohydrates in each food that is included. The following list contains standard serving sizes of common carbohydrate-rich foods. Each of these servings has about 15 g of carbohydrates: 1 slice of bread. 1 six-inch (15 cm) tortilla. ? cup or 2 oz (53 g) cooked rice or pasta.  cup or 3 oz (85 g) cooked or canned, drained and rinsed beans or lentils.  cup or 3 oz (85 g) starchy vegetable, such as peas, corn, or squash.  cup or 4 oz (120 g) hot cereal.  cup or 3 oz (85 g) boiled or mashed potatoes, or  or 3 oz (85 g) of a large baked potato.  cup or 4 fl oz (118 mL) fruit juice. 1 cup or 8 fl oz (237 mL) milk. 1 small or 4 oz (106 g) apple.  or 2 oz (63 g) of a medium banana. 1 cup or 5 oz (150 g) strawberries. 3 cups or 1 oz (24 g) popped popcorn. What is an example of carbohydrate counting? To calculate the number of carbohydrates in this sample meal, follow the stepsshown below. Sample meal 3 oz (85 g) chicken breast. ? cup or 4 oz (106 g) brown rice.    cup or 3 oz (85 g) corn. 1 cup or 8 fl oz (237 mL) milk. 1 cup or 5 oz (150 g) strawberries with sugar-free whipped topping. Carbohydrate calculation Identify the foods that contain carbohydrates: Rice. Corn. Milk. Strawberries. Calculate how many servings you have of each food: 2 servings rice. 1 serving corn. 1 serving milk. 1 serving strawberries. Multiply each number of servings by 15 g: 2 servings  rice x 15 g = 30 g. 1 serving corn x 15 g = 15 g. 1 serving milk x 15 g = 15 g. 1 serving strawberries x 15 g = 15 g. Add together all of the amounts to find the total grams of carbohydrates eaten: 30 g + 15 g + 15 g + 15 g = 75 g of carbohydrates total. What are tips for following this plan? Shopping Develop a meal plan and then make a shopping list. Buy fresh and frozen vegetables, fresh and frozen fruit, dairy, eggs, beans, lentils, and whole grains. Look at food labels. Choose foods that have more fiber and less sugar. Avoid processed foods and foods with added sugars. Meal planning Aim to have the same amount of carbohydrates at each meal and for each snack time. Plan to have regular, balanced meals and snacks. Where to find more information American Diabetes Association: www.diabetes.org Centers for Disease Control and Prevention: www.cdc.gov Summary Carbohydrate counting is a method of keeping track of how many carbohydrates you eat. Eating carbohydrates naturally increases the amount of sugar (glucose) in the blood. Counting how many carbohydrates you eat improves your blood glucose control, which helps you manage your diabetes. A dietitian can help you make a meal plan and calculate how many carbohydrates you should have at each meal and snack. This information is not intended to replace advice given to you by your health care provider. Make sure you discuss any questions you have with your healthcare provider. Document Revised: 09/24/2019 Document Reviewed: 09/25/2019 Elsevier Patient Education  2021 Elsevier Inc.  

## 2021-03-24 ENCOUNTER — Other Ambulatory Visit: Payer: Self-pay

## 2021-03-24 LAB — MICROALBUMIN / CREATININE URINE RATIO
Creatinine, Urine: 187 mg/dL
Microalb/Creat Ratio: 16 mg/g creat (ref 0–29)
Microalbumin, Urine: 29.8 ug/mL

## 2021-03-27 ENCOUNTER — Ambulatory Visit: Payer: Self-pay | Admitting: Pulmonary Disease

## 2021-03-30 ENCOUNTER — Ambulatory Visit: Payer: Self-pay | Admitting: Licensed Clinical Social Worker

## 2021-03-31 ENCOUNTER — Other Ambulatory Visit: Payer: Self-pay

## 2021-03-31 MED FILL — Allopurinol Tab 100 MG: ORAL | 90 days supply | Qty: 90 | Fill #0 | Status: AC

## 2021-04-04 ENCOUNTER — Other Ambulatory Visit: Payer: Self-pay

## 2021-04-04 ENCOUNTER — Ambulatory Visit: Payer: Self-pay | Admitting: Specialist

## 2021-04-04 DIAGNOSIS — M25562 Pain in left knee: Secondary | ICD-10-CM

## 2021-04-04 DIAGNOSIS — M25561 Pain in right knee: Secondary | ICD-10-CM

## 2021-04-04 NOTE — Progress Notes (Signed)
Note was reviewed by Dr Justice Rocher and approved to be signed Subjective:     Patient ID: Amy Knapp, female   DOB: 10/23/1955, 65 y.o.   MRN: [id number]  HPI 65 year old has a Hx. Of a Fx right tibial plateau 15 years ago treated with an ORIF. She does work at a job that calls for standing. 2 months ago, without trauma the knee began hurting. Her main c/o is pain with stairs worse with ascending, although she is still able to do them one step at a time.   Review of Systems     Objective:   Physical Exam    On inspection, she has a long surgical scar medial to the midline. I believe I can palpate the plate and screws.  Her gait is essentially normal. She is able to heel/toe walk. She is ale to do a partial squat but complains about an anterior knee pain. Her leg alignment looks normal. In the supine position there is no effusion. Although the knee is puffier on the left side. She has good MOBs. ROM: 0-130 degrees.   In full extension and in 30 degrees of flexion, there is no M/L laxity. The lockman test is negative.   She has varicosities on her right leg that she does not have on the left leg.  Assessment:         Plan:    We need to see X-rays to see the status of the joints.   Standing AP and lateral both knees.

## 2021-04-05 ENCOUNTER — Telehealth: Payer: Self-pay | Admitting: Pharmacist

## 2021-04-05 NOTE — Telephone Encounter (Signed)
Patient approved for medication assistance at [facility] until 01/06/22, as long as eligibility criteria continues to be met.   Amy Knapp [facility] Administrative Assistant 

## 2021-04-06 ENCOUNTER — Ambulatory Visit: Payer: Self-pay | Admitting: Licensed Clinical Social Worker

## 2021-04-06 ENCOUNTER — Other Ambulatory Visit: Payer: Self-pay

## 2021-04-06 DIAGNOSIS — F4323 Adjustment disorder with mixed anxiety and depressed mood: Secondary | ICD-10-CM

## 2021-04-06 NOTE — BH Specialist Note (Signed)
Integrated Behavioral Health Follow Up In-Person Visit  MRN: [id number] Name: Amy Knapp   Total time: 60 minutes  Types of Service: Telephone visit Patient consents to telephone visit and 2 patient identifiers were used to identify patient   Interpretor:No. Interpretor Name and Language: N/A  Subjective: Amy Knapp is a 65 y.o. female accompanied by  herself Patient was referred by Hurman Horn, NP for mental health. Patient reports the following symptoms/concerns: The patient noted that she is doing better than she was during her last follow-up visit.  However, she stated she is not sleeping well. She explained that after she lays down it takes her 90 minutes or more to fall asleep. She noted that she did not wake up often anymore unless it is because of her neighbors. She shared that last night her neighbor was vacuuming at 3:15 AM and woke her up. She stated she has complained to the landlord multiple times about the neighbors stomping, beating on the walls, and making excessive noise, but nothing is being done about it. She discussed other stressors regarding her living arrangements. She reported her son was recently diagnosed with stage 2 colon cancer. The patient noted her son is hesitant to start treatment because his wife is in the hospital on bedrest and is almost seven months pregnant with twins. She explained that she has been trying to encourage her son to look after his health but is concerned he won't take care of himself. The patient discussed work and other situational stressors. She noted she had one panic attack last week when she was pushing a cart that was overloaded up a ramp. She reports she became short of breath and stopped pushing the cart; when she did not recover quickly she started panicking. She explained that overall she feels she is doing okay except for sleeping, and would like to increase the dosage of Trazodone she takes. The patient  denied any suicidal or homicidal thoughts.  Duration of problem: Years; Severity of problem: moderate  Objective: Mood: Euthymic and Affect: Appropriate Risk of harm to self or others: No plan to harm self or others  Life Context: Family and Social: see above School/Work: see above  Self-Care: see above Life Changes: see above  Patient and/or Family's Strengths/Protective Factors: Concrete supports in place (healthy food, safe environments, etc.)  Goals Addressed: Patient will:  Reduce symptoms of: agitation, anxiety, depression, insomnia, and stress   Increase knowledge and/or ability of: coping skills, healthy habits, self-management skills, and stress reduction   Demonstrate ability to: Increase healthy adjustment to current life circumstances  Progress towards Goals: Ongoing  Interventions: Interventions utilized:  CBT Cognitive Behavioral Therapy was utilized by the clinician during today's follow up session. The clinician processed with the patient how they have been doing since the last follow-up session. The clinician provided a space for the patient to ventilate their frustrations regarding their current life circumstances. Clinician measured the patient's anxiety and depression on a numerical scale.  Clinician encouraged the patient to focus on the positives rather than the negatives in her life. Clinician offered to present her case for consultation with Dr. Emilia Beck, MD psychiatric consultant and Hurman Horn, NP on Tuesday 04/11/21 at 11:00 AM. Clinician encouraged the patient to continue to advocate for herself with her landlord. Clinician encouraged the patient to utilize her coping skills to deal with her current life's circumstances.  Standardized Assessments completed: GAD-7 and PHQ 9 PHQ-9      13 GAD-7  14  Patient and/or Family Response: see above  Patient Centered Plan: Patient is on the following Treatment Plan(s): see above Assessment: Patient  currently experiencing see above.   Patient may benefit from see above.  Plan: Follow up with behavioral health clinician on : 04/18/2021 at 5:30 PM  Behavioral recommendations:  Referral(s): [facility] [employer] (In Clinic) "From scale of 1-10, how likely are you to follow plan?":   Judith Part, LCSWA

## 2021-04-07 ENCOUNTER — Other Ambulatory Visit: Payer: Self-pay

## 2021-04-11 ENCOUNTER — Ambulatory Visit
Admission: RE | Admit: 2021-04-11 | Discharge: 2021-04-11 | Disposition: A | Discharge status: Home / Self Care | Payer: Self-pay | Attending: Gastroenterology | Admitting: Gastroenterology

## 2021-04-11 ENCOUNTER — Other Ambulatory Visit: Payer: Self-pay

## 2021-04-11 ENCOUNTER — Encounter
Admission: RE | Disposition: A | Discharge status: Home / Self Care | Payer: Self-pay | Source: Home / Self Care | Attending: Gastroenterology

## 2021-04-11 ENCOUNTER — Encounter: Payer: Self-pay | Admitting: Gastroenterology

## 2021-04-11 ENCOUNTER — Ambulatory Visit: Payer: Self-pay

## 2021-04-11 DIAGNOSIS — Z560 Unemployment, unspecified: Secondary | ICD-10-CM | POA: Insufficient documentation

## 2021-04-11 DIAGNOSIS — Z885 Allergy status to narcotic agent status: Secondary | ICD-10-CM | POA: Insufficient documentation

## 2021-04-11 DIAGNOSIS — M109 Gout, unspecified: Secondary | ICD-10-CM | POA: Insufficient documentation

## 2021-04-11 DIAGNOSIS — F17210 Nicotine dependence, cigarettes, uncomplicated: Secondary | ICD-10-CM | POA: Insufficient documentation

## 2021-04-11 DIAGNOSIS — Z800 Family history of malignant neoplasm of digestive organs: Secondary | ICD-10-CM | POA: Insufficient documentation

## 2021-04-11 DIAGNOSIS — R1310 Dysphagia, unspecified: Secondary | ICD-10-CM

## 2021-04-11 DIAGNOSIS — Z880 Allergy status to penicillin: Secondary | ICD-10-CM | POA: Insufficient documentation

## 2021-04-11 DIAGNOSIS — Z7984 Long term (current) use of oral hypoglycemic drugs: Secondary | ICD-10-CM | POA: Insufficient documentation

## 2021-04-11 DIAGNOSIS — Z79890 Hormone replacement therapy (postmenopausal): Secondary | ICD-10-CM | POA: Insufficient documentation

## 2021-04-11 DIAGNOSIS — Z91018 Allergy to other foods: Secondary | ICD-10-CM | POA: Insufficient documentation

## 2021-04-11 DIAGNOSIS — K449 Diaphragmatic hernia without obstruction or gangrene: Secondary | ICD-10-CM | POA: Insufficient documentation

## 2021-04-11 DIAGNOSIS — K529 Noninfective gastroenteritis and colitis, unspecified: Secondary | ICD-10-CM | POA: Insufficient documentation

## 2021-04-11 DIAGNOSIS — R197 Diarrhea, unspecified: Secondary | ICD-10-CM

## 2021-04-11 DIAGNOSIS — K5730 Diverticulosis of large intestine without perforation or abscess without bleeding: Secondary | ICD-10-CM | POA: Insufficient documentation

## 2021-04-11 DIAGNOSIS — Z79899 Other long term (current) drug therapy: Secondary | ICD-10-CM | POA: Insufficient documentation

## 2021-04-11 HISTORY — PX: ESOPHAGOGASTRODUODENOSCOPY: SHX5428

## 2021-04-11 HISTORY — PX: COLONOSCOPY WITH PROPOFOL: SHX5780

## 2021-04-11 LAB — GLUCOSE, CAPILLARY: Glucose-Capillary: 152 mg/dL — ABNORMAL HIGH (ref 70–99)

## 2021-04-11 SURGERY — COLONOSCOPY WITH PROPOFOL
Anesthesia: General

## 2021-04-11 MED ORDER — PROPOFOL 500 MG/50ML IV EMUL
INTRAVENOUS | Status: AC
  Filled 2021-04-11: qty 50

## 2021-04-11 MED ORDER — PROPOFOL 500 MG/50ML IV EMUL
INTRAVENOUS | Status: DC | PRN
  Administered 2021-04-11: 75 ug/kg/min via INTRAVENOUS
  Administered 2021-04-11: 70 mg via INTRAVENOUS
  Administered 2021-04-11: 30 mg via INTRAVENOUS

## 2021-04-11 MED ORDER — LIDOCAINE HCL (PF) 2 % IJ SOLN
INTRAMUSCULAR | Status: AC
  Filled 2021-04-11: qty 5

## 2021-04-11 MED ORDER — PROPOFOL 10 MG/ML IV BOLUS
INTRAVENOUS | Status: AC
  Filled 2021-04-11: qty 20

## 2021-04-11 MED ORDER — PHENYLEPHRINE HCL (PRESSORS) 10 MG/ML IV SOLN
INTRAVENOUS | Status: AC
  Filled 2021-04-11: qty 1

## 2021-04-11 MED ORDER — SODIUM CHLORIDE 0.9 % IV SOLN: INTRAVENOUS | Status: DC

## 2021-04-11 MED ORDER — LIDOCAINE HCL (CARDIAC) PF 100 MG/5ML IV SOSY
PREFILLED_SYRINGE | INTRAVENOUS | Status: DC | PRN
  Administered 2021-04-11: 50 mg via INTRAVENOUS

## 2021-04-11 NOTE — Op Note (Signed)
[facility] Gastroenterology Patient Name: Amy Knapp Procedure Date: 04/11/2021 7:56 AM MRN: [id number] Account #: [id number] Date of Birth: [birth year] Admit Type: Outpatient Age: 65 Room: [facility] ENDO ROOM 4 Gender: Female Note Status: Finalized Procedure:             Upper GI endoscopy Indications:           Dysphagia Providers:             Midge Minium MD, MD Referring MD:          No Local Md, MD (Referring MD) Medicines:             Propofol per Anesthesia Complications:         No immediate complications. Procedure:             Pre-Anesthesia Assessment:                        - Prior to the procedure, a History and Physical was                         performed, and patient medications and allergies were                         reviewed. The patient's tolerance of previous                         anesthesia was also reviewed. The risks and benefits                         of the procedure and the sedation options and risks                         were discussed with the patient. All questions were                         answered, and informed consent was obtained. Prior                         Anticoagulants: The patient has taken no previous                         anticoagulant or antiplatelet agents. ASA Grade                         Assessment: II - A patient with mild systemic disease.                         After reviewing the risks and benefits, the patient                         was deemed in satisfactory condition to undergo the                         procedure.                        After obtaining informed consent, the endoscope was  passed under direct vision. Throughout the procedure,                         the patient's blood pressure, pulse, and oxygen                         saturations were monitored continuously. The Endoscope                         was introduced through the mouth, and advanced to the                          second part of duodenum. The upper GI endoscopy was                         accomplished without difficulty. The patient tolerated                         the procedure well. Findings:      A small hiatal hernia was present.      The stomach was normal.      The examined duodenum was normal.      Two biopsies were obtained with cold forceps for histology in the middle       third of the esophagus. A TTS dilator was passed through the scope.       Dilation with a 15-16.5-18 mm balloon dilator was performed to 18 mm in       the entire esophagus. Impression:            - Small hiatal hernia.                        - Normal stomach.                        - Normal examined duodenum.                        - Biopsy performed in the middle third of the                         esophagus.                        - Dilation performed in the entire esophagus. Recommendation:        - Discharge patient to home.                        - Resume previous diet.                        - Continue present medications.                        - Await pathology results.                        - Perform a colonoscopy today. Procedure Code(s):     --- Professional ---                        [phone], Esophagogastroduodenoscopy, flexible,  transoral; with transendoscopic balloon dilation of                         esophagus (less than 30 mm diameter)                        43239, 59, Esophagogastroduodenoscopy, flexible,                         transoral; with biopsy, single or multiple Diagnosis Code(s):     --- Professional ---                        R13.10, Dysphagia, unspecified                        K44.9, Diaphragmatic hernia without obstruction or                         gangrene CPT copyright 2019 American Medical Association. All rights reserved. The codes documented in this report are preliminary and upon coder review may  be revised to meet current  compliance requirements. Midge Minium MD, MD 04/11/2021 8:08:45 AM This report has been signed electronically. Number of Addenda: 0 Note Initiated On: 04/11/2021 7:56 AM      [facility]

## 2021-04-11 NOTE — Op Note (Signed)
[facility] Gastroenterology Patient Name: Amy Knapp Procedure Date: 04/11/2021 7:55 AM MRN: [id number] Account #: [id number] Date of Birth: Jan 18, 1956 Admit Type: Outpatient Age: 65 Room: [facility] ENDO ROOM 4 Gender: Female Note Status: Finalized Procedure:             Colonoscopy Indications:           Chronic diarrhea Providers:             Midge Minium MD, MD Referring MD:          No Local Md, MD (Referring MD) Medicines:             Propofol per Anesthesia Complications:         No immediate complications. Procedure:             Pre-Anesthesia Assessment:                        - Prior to the procedure, a History and Physical was                         performed, and patient medications and allergies were                         reviewed. The patient's tolerance of previous                         anesthesia was also reviewed. The risks and benefits                         of the procedure and the sedation options and risks                         were discussed with the patient. All questions were                         answered, and informed consent was obtained. Prior                         Anticoagulants: The patient has taken no previous                         anticoagulant or antiplatelet agents. ASA Grade                         Assessment: II - A patient with mild systemic disease.                         After reviewing the risks and benefits, the patient                         was deemed in satisfactory condition to undergo the                         procedure.                        After obtaining informed consent, the colonoscope was  passed under direct vision. Throughout the procedure,                         the patient's blood pressure, pulse, and oxygen                         saturations were monitored continuously. The                         Colonoscope was introduced through the anus and                          advanced to the the terminal ileum. The colonoscopy                         was performed without difficulty. The patient                         tolerated the procedure well. The quality of the bowel                         preparation was adequate to identify polyps. Findings:      The perianal and digital rectal examinations were normal.      The terminal ileum appeared normal. Biopsies were taken with a cold       forceps for histology.      Multiple small-mouthed diverticula were found in the sigmoid colon.      Random biopsies were obtained with cold forceps for histology in the       entire colon. Impression:            - The examined portion of the ileum was normal.                         Biopsied.                        - Diverticulosis in the sigmoid colon.                        - Random biopsies were obtained in the entire colon. Recommendation:        - Discharge patient to home.                        - Resume previous diet.                        - Continue present medications.                        - Await pathology results.                        - Repeat colonoscopy in 10 years for surveillance. Procedure Code(s):     --- Professional ---                        [phone], Colonoscopy, flexible; with biopsy, single or                         multiple Diagnosis Code(s):     ---  Professional ---                        K52.9, Noninfective gastroenteritis and colitis,                         unspecified CPT copyright 2019 American Medical Association. All rights reserved. The codes documented in this report are preliminary and upon coder review may  be revised to meet current compliance requirements. Midge Minium MD, MD 04/11/2021 8:33:13 AM This report has been signed electronically. Number of Addenda: 0 Note Initiated On: 04/11/2021 7:55 AM Scope Withdrawal Time: 0 hours 16 minutes 28 seconds  Total Procedure Duration: 0 hours 21 minutes 24 seconds  Estimated Blood  Loss:  Estimated blood loss: none.      [facility]

## 2021-04-11 NOTE — Transfer of Care (Signed)
Immediate Anesthesia Transfer of Care Note  Patient: Amy Knapp  Procedure(s) Performed: COLONOSCOPY WITH PROPOFOL ESOPHAGOGASTRODUODENOSCOPY (EGD)  Patient Location: PACU  Anesthesia Type:General  Level of Consciousness: sedated  Airway & Oxygen Therapy: Patient Spontanous Breathing and Patient connected to nasal cannula oxygen  Post-op Assessment: Report given to RN and Post -op Vital signs reviewed and stable  Post vital signs: Reviewed and stable  Last Vitals:  Vitals Value Taken Time  BP 127/74 04/11/21 0834  Temp 35.6 C 04/11/21 0834  Pulse 79 04/11/21 0834  Resp 19 04/11/21 0834  SpO2 100 % 04/11/21 0834  Vitals shown include unvalidated device data.  Last Pain:  Vitals:   04/11/21 0834  TempSrc: Temporal  PainSc: Asleep         Complications: No notable events documented.

## 2021-04-11 NOTE — Anesthesia Preprocedure Evaluation (Signed)
Anesthesia Evaluation  Patient identified by MRN, date of birth, ID band Patient awake    Reviewed: Allergy & Precautions, NPO status , Patient's Chart, lab work & pertinent test results  History of Anesthesia Complications Negative for: history of anesthetic complications  Airway Mallampati: II  TM Distance: >3 FB Neck ROM: Full    Dental  (+) Poor Dentition, Missing   Pulmonary asthma , neg sleep apnea, COPD,  COPD inhaler, Current SmokerPatient did not abstain from smoking.,    breath sounds clear to auscultation- rhonchi (-) wheezing      Cardiovascular (-) hypertension(-) CAD, (-) Past MI, (-) Cardiac Stents and (-) CABG  Rhythm:Regular Rate:Normal - Systolic murmurs and - Diastolic murmurs    Neuro/Psych neg Seizures PSYCHIATRIC DISORDERS Anxiety Depression negative neurological ROS     GI/Hepatic Neg liver ROS, GERD  ,  Endo/Other  diabetes, Oral Hypoglycemic AgentsHypothyroidism   Renal/GU negative Renal ROS     Musculoskeletal  (+) Arthritis ,   Abdominal (+) + obese,   Peds  Hematology negative hematology ROS (+)   Anesthesia Other Findings Past Medical History: No date: Allergy No date: Anxiety No date: Arthritis No date: Asthma 2007: Broken leg     Comment:  Right No date: Cataract No date: COPD (chronic obstructive pulmonary disease) (HCC) No date: Depression No date: Diabetes mellitus without complication (HCC) No date: Emphysema of lung (HCC) No date: GERD (gastroesophageal reflux disease) No date: Gout No date: Hyperlipidemia No date: Sleep apnea No date: Thyroid disease   Reproductive/Obstetrics                             Anesthesia Physical Anesthesia Plan  ASA: 3  Anesthesia Plan: General   Post-op Pain Management:    Induction: Intravenous  PONV Risk Score and Plan: 1 and Propofol infusion  Airway Management Planned: Natural Airway  Additional  Equipment:   Intra-op Plan:   Post-operative Plan:   Informed Consent: I have reviewed the patients History and Physical, chart, labs and discussed the procedure including the risks, benefits and alternatives for the proposed anesthesia with the patient or authorized representative who has indicated his/her understanding and acceptance.     Dental advisory given  Plan Discussed with: CRNA and Anesthesiologist  Anesthesia Plan Comments:         Anesthesia Quick Evaluation

## 2021-04-11 NOTE — Anesthesia Postprocedure Evaluation (Signed)
Anesthesia Post Note  Patient: Amy Knapp  Procedure(s) Performed: COLONOSCOPY WITH PROPOFOL ESOPHAGOGASTRODUODENOSCOPY (EGD)  Patient location during evaluation: PACU Anesthesia Type: General Level of consciousness: awake and alert and oriented Pain management: pain level controlled Vital Signs Assessment: post-procedure vital signs reviewed and stable Respiratory status: spontaneous breathing, nonlabored ventilation and respiratory function stable Cardiovascular status: blood pressure returned to baseline and stable Postop Assessment: no signs of nausea or vomiting Anesthetic complications: no   No notable events documented.   Last Vitals:  Vitals:   04/11/21 0834 04/11/21 0844  BP:  (!) 151/86  Pulse:    Resp:    Temp: (!) 35.6 C   SpO2:      Last Pain:  Vitals:   04/11/21 0834  TempSrc: Temporal  PainSc: Asleep                 Henessy Rohrer

## 2021-04-11 NOTE — Interval H&P Note (Signed)
Midge Minium, MD [facility] [address], [address] [place], [place] [address] [phone] Fax : [phone]  Primary Care Physician:  System, Provider Not In Primary Gastroenterologist:  Dr. Servando Snare  Pre-Procedure History & Physical: HPI:  Amy Knapp is a 65 y.o. female is here for an endoscopy and colonoscopy.   Past Medical History:  Diagnosis Date   Allergy    Anxiety    Arthritis    Asthma    Broken leg 2007   Right   Cataract    COPD (chronic obstructive pulmonary disease) (HCC)    Depression    Diabetes mellitus without complication (HCC)    Emphysema of lung (HCC)    GERD (gastroesophageal reflux disease)    Gout    Hyperlipidemia    Sleep apnea    Thyroid disease     Past Surgical History:  Procedure Laterality Date   ABDOMINAL HYSTERECTOMY  2000   Partial   LEG SURGERY Right 10/08/2005   broken right leg   TUBAL LIGATION      Prior to Admission medications   Medication Sig Start Date End Date Taking? Authorizing Provider  albuterol (VENTOLIN HFA) 108 (90 Base) MCG/ACT inhaler INHALE 2 PUFFS INTO THE LUNGS EVERY 6 HOURS AS NEEDED FOR WHEEZING OR SHORT OF BREATH 08/09/20 08/09/21 Yes Abate, Desta A, NP  allopurinol (ZYLOPRIM) 100 MG tablet TAKE ONE TABLET BY MOUTH EVERY DAY 12/22/20 06/29/21 Yes Iloabachie, Chioma E, NP  atorvastatin (LIPITOR) 10 MG tablet TAKE ONE TABLET BY MOUTH EVERY DAY 03/23/21 02/18/22 Yes Iloabachie, Chioma E, NP  Fluticasone-Umeclidin-Vilant 100-62.5-25 MCG/INH AEPB INHALE 1 PUFF INTO THE LUNGS EVERY DAY 12/20/20 12/20/21 Yes Iloabachie, Chioma E, NP  gabapentin (NEURONTIN) 400 MG capsule Take 1 capsule (400 mg total) by mouth 3 (three) times daily. 02/09/21 05/10/21 Yes Mariane Duval, PA-C  levothyroxine (SYNTHROID) 100 MCG tablet TAKE ONE TABLET BY MOUTH 45 MINUTES BEFORE BREAKFAST. 03/23/21 09/06/21 Yes Iloabachie, Chioma E, NP  montelukast (SINGULAIR) 10 MG tablet TAKE ONE TABLET BY MOUTH AT BEDTIME 03/23/21 03/23/22 Yes  Iloabachie, Chioma E, NP  traZODone (DESYREL) 50 MG tablet TAKE ONE TABLET BY MOUTH AT BEDTIME 03/23/21 05/25/21 Yes Iloabachie, Chioma E, NP  glipiZIDE (GLUCOTROL) 5 MG tablet TAKE ONE TABLET BY MOUTH EVERY DAY 03/23/21 03/23/22  Iloabachie, Chioma E, NP  ibuprofen (ADVIL) 800 MG tablet TAKE ONE TABLET BY MOUTH 2 TIMES A DAY 03/23/21 03/23/22  Iloabachie, Chioma E, NP  omeprazole (PRILOSEC) 20 MG capsule TAKE ONE CAPSULE BY MOUTH EVERY DAY 01/17/21 08/01/21  Iloabachie, Chioma E, NP  polyethylene glycol (GOLYTELY) 236 g solution Drink one 8 oz glass every 20 mins until entire container is finished starting at 5:00pm on 04/10/21 Patient not taking: Reported on 04/11/2021 03/21/21   Midge Minium, MD    Allergies as of 03/22/2021 - Review Complete 03/21/2021  Allergen Reaction Noted   Celery oil  02/04/2014   Demerol [meperidine]  12/09/2014   Morphine and related Other (See Comments) 12/09/2014   Penicillins  02/21/2016    Family History  Problem Relation Age of Onset   Cancer Mother    Heart attack Father    Heart attack Sister    Cancer Brother    Diabetes Daughter    Diabetes Son    Hypertension Maternal Aunt    Breast cancer Neg Hx     Social History   Socioeconomic History   Marital status: Divorced    Spouse name: Not on file   Number of children:  4   Years of education: Not on file   Highest education level: High school graduate  Occupational History   Occupation: Unemployed  Tobacco Use   Smoking status: Every Day    Packs/day: 0.15    Pack years: 0.00    Types: Cigarettes   Smokeless tobacco: Never   Tobacco comments:    Smoked half a cigarette this am 04/11/2021  Vaping Use   Vaping Use: Never used  Substance and Sexual Activity   Alcohol use: No   Drug use: No   Sexual activity: Not Currently  Other Topics Concern   Not on file  Social History Narrative   Not on file   Social Determinants of Health   Financial Resource Strain: Not on file  Food Insecurity: Not  on file  Transportation Needs: Not on file  Physical Activity: Not on file  Stress: Not on file  Social Connections: Not on file  Intimate Partner Violence: Not on file    Review of Systems: See HPI, otherwise negative ROS  Physical Exam: There were no vitals taken for this visit. General:   Alert,  pleasant and cooperative in NAD Head:  Normocephalic and atraumatic. Neck:  Supple; no masses or thyromegaly. Lungs:  Clear throughout to auscultation.    Heart:  Regular rate and rhythm. Abdomen:  Soft, nontender and nondistended. Normal bowel sounds, without guarding, and without rebound.   Neurologic:  Alert and  oriented x4;  grossly normal neurologically.  Impression/Plan: Amy Knapp is here for an endoscopy and colonoscopy to be performed for dysphagia and diarrhea  Risks, benefits, limitations, and alternatives regarding  endoscopy and colonoscopy have been reviewed with the patient.  Questions have been answered.  All parties agreeable.   Midge Minium, MD  04/11/2021, 7:32 AM

## 2021-04-12 ENCOUNTER — Encounter: Payer: Self-pay | Admitting: Gastroenterology

## 2021-04-12 LAB — SURGICAL PATHOLOGY

## 2021-04-13 ENCOUNTER — Other Ambulatory Visit: Payer: Self-pay

## 2021-04-15 ENCOUNTER — Encounter: Payer: Self-pay | Admitting: Gastroenterology

## 2021-04-18 ENCOUNTER — Telehealth: Payer: Self-pay | Admitting: Licensed Clinical Social Worker

## 2021-04-18 ENCOUNTER — Ambulatory Visit: Payer: Self-pay | Admitting: Licensed Clinical Social Worker

## 2021-04-18 ENCOUNTER — Ambulatory Visit: Payer: Self-pay | Admitting: Specialist

## 2021-04-18 NOTE — Telephone Encounter (Signed)
Called the patient twice during today's scheduled appointment; no answer, left a voicemail with the clinic contact information so they may reschedule.   

## 2021-04-20 ENCOUNTER — Other Ambulatory Visit: Payer: Self-pay

## 2021-04-20 ENCOUNTER — Ambulatory Visit: Payer: Self-pay | Admitting: Gerontology

## 2021-04-20 VITALS — BP 148/80 | HR 77 | Temp 98.3°F | Ht 61.0 in | Wt 172.0 lb

## 2021-04-20 DIAGNOSIS — M109 Gout, unspecified: Secondary | ICD-10-CM

## 2021-04-20 DIAGNOSIS — E785 Hyperlipidemia, unspecified: Secondary | ICD-10-CM

## 2021-04-20 DIAGNOSIS — E1140 Type 2 diabetes mellitus with diabetic neuropathy, unspecified: Secondary | ICD-10-CM

## 2021-04-20 DIAGNOSIS — J449 Chronic obstructive pulmonary disease, unspecified: Secondary | ICD-10-CM

## 2021-04-20 DIAGNOSIS — E1159 Type 2 diabetes mellitus with other circulatory complications: Secondary | ICD-10-CM | POA: Insufficient documentation

## 2021-04-20 DIAGNOSIS — I10 Essential (primary) hypertension: Secondary | ICD-10-CM

## 2021-04-20 DIAGNOSIS — K449 Diaphragmatic hernia without obstruction or gangrene: Secondary | ICD-10-CM | POA: Insufficient documentation

## 2021-04-20 DIAGNOSIS — J441 Chronic obstructive pulmonary disease with (acute) exacerbation: Secondary | ICD-10-CM

## 2021-04-20 HISTORY — DX: Essential (primary) hypertension: I10

## 2021-04-20 MED ORDER — MONTELUKAST SODIUM 10 MG PO TABS
ORAL_TABLET | Freq: Every day | ORAL | 1 refills | Status: DC
  Filled 2021-04-20: qty 30, fill #0
  Filled 2021-06-16: qty 30, 30d supply, fill #0
  Filled 2021-07-19: qty 30, 30d supply, fill #1

## 2021-04-20 MED ORDER — ALBUTEROL SULFATE HFA 108 (90 BASE) MCG/ACT IN AERS
INHALATION_SPRAY | RESPIRATORY_TRACT | 2 refills | Status: DC
  Filled 2021-04-20: qty 6.7, 25d supply, fill #0
  Filled 2021-05-26: qty 6.7, 25d supply, fill #1
  Filled 2021-06-16: qty 6.7, 25d supply, fill #2
  Filled 2021-07-19: qty 6.7, 25d supply, fill #3

## 2021-04-20 MED ORDER — ALLOPURINOL 100 MG PO TABS
ORAL_TABLET | Freq: Every day | ORAL | 1 refills | Status: DC
  Filled 2021-04-20: qty 90, fill #0
  Filled 2021-07-19: qty 90, 90d supply, fill #0
  Filled 2021-10-20: qty 60, 60d supply, fill #1

## 2021-04-20 MED ORDER — LOSARTAN POTASSIUM 25 MG PO TABS
25.0000 mg | ORAL_TABLET | Freq: Every day | ORAL | 0 refills | Status: DC
  Filled 2021-04-20: qty 30, 30d supply, fill #0

## 2021-04-20 MED ORDER — GABAPENTIN 400 MG PO CAPS
400.0000 mg | ORAL_CAPSULE | Freq: Three times a day (TID) | ORAL | 0 refills | Status: DC
  Filled 2021-04-20: qty 270, 90d supply, fill #0
  Filled 2021-05-12: qty 90, 30d supply, fill #0
  Filled 2021-06-16: qty 90, 30d supply, fill #1
  Filled 2021-07-19: qty 90, 30d supply, fill #2

## 2021-04-20 MED ORDER — ATORVASTATIN CALCIUM 10 MG PO TABS
ORAL_TABLET | Freq: Every day | ORAL | 2 refills | Status: DC
  Filled 2021-04-20: qty 30, fill #0
  Filled 2021-07-19: qty 30, 30d supply, fill #0
  Filled 2021-08-18: qty 30, 30d supply, fill #1

## 2021-04-20 NOTE — Patient Instructions (Signed)
Cooking With Less Salt Cooking with less salt is one way to reduce the amount of sodium you get from food. Sodium is one of the elements that make up salt. It is found naturally in foods and is also added to certain foods. Depending on your condition and overall health, your health care provider or dietitian may recommend that you reduce your sodium intake. Most people should have less than 2,300 milligrams (mg) of sodium each day. If you have high blood pressure (hypertension), you may need to limit your sodium to 1,500 mg each day. Follow the tipsbelow to help reduce your sodium intake. What are tips for eating less sodium? Reading food labels  Check the food label before buying or using packaged ingredients. Always check the label for the serving size and sodium content. Look for products with no more than 140 mg of sodium in one serving. Check the % Daily Value column to see what percent of the daily recommended amount of sodium is provided in one serving of the product. Foods with 5% or less in this column are considered low in sodium. Foods with 20% or higher are considered high in sodium. Do not choose foods with salt as one of the first three ingredients on the ingredients list. If salt is one of the first three ingredients, it usually means the item is high in sodium.  Shopping Buy sodium-free or low-sodium products. Look for the following words on food labels: Low-sodium. Sodium-free. Reduced-sodium. No salt added. Unsalted. Always check the sodium content even if foods are labeled as low-sodium or no salt added. Buy fresh foods. Cooking Use herbs, seasonings without salt, and spices as substitutes for salt. Use sodium-free baking soda when baking. Grill, braise, or roast foods to add flavor with less salt. Avoid adding salt to pasta, rice, or hot cereals. Drain and rinse canned vegetables, beans, and meat before use. Avoid adding salt when cooking sweets and desserts. Cook with  low-sodium ingredients. What foods are high in sodium? Vegetables Regular canned vegetables (not low-sodium or reduced-sodium). Sauerkraut, pickled vegetables, and relishes. Olives. French fries. Onion rings. Regular canned tomato sauce and paste. Regular tomato and vegetable juice. Frozenvegetables in sauces. Grains Instant hot cereals. Bread stuffing, pancake, and biscuit mixes. Croutons. Seasoned rice or pasta mixes. Noodle soup cups. Boxed or frozen macaroni and cheese. Regular salted crackers. Self-rising flour. Rolls. Bagels. Flourtortillas and wraps. Meats and other proteins Meat or fish that is salted, canned, smoked, cured, spiced, or pickled. This includes bacon, ham, sausages, hot dogs, corned beef, chipped beef, meat loaves, salt pork, jerky, pickled herring, anchovies, regular canned tuna, andsardines. Salted nuts. Dairy Processed cheese and cheese spreads. Cheese curds. Blue cheese. Feta cheese.String cheese. Regular cottage cheese. Buttermilk. Canned milk. The items listed above may not be a complete list of foods high in sodium. Actual amounts of sodium may be different depending on processing. Contact a dietitian for more information. What foods are low in sodium? Fruits Fresh, frozen, or canned fruit with no sauce added. Fruit juice. Vegetables Fresh or frozen vegetables with no sauce added. "No salt added" canned vegetables. "No salt added" tomato sauce and paste. Low-sodium orreduced-sodium tomato and vegetable juice. Grains Noodles, pasta, quinoa, rice. Shredded or puffed wheat or puffed rice. Regular or quick oats (not instant). Low-sodium crackers. Low-sodium bread. Whole-grainbread and whole-grain pasta. Unsalted popcorn. Meats and other proteins Fresh or frozen whole meats, poultry (not injected with sodium), and fish with no sauce added. Unsalted nuts. Dried peas, beans, and   lentils without added salt. Unsalted canned beans. Eggs. Unsalted nut butters. Low-sodium canned  tunaor chicken. Dairy Milk. Soy milk. Yogurt. Low-sodium cheeses, such as Swiss, [address], [place], and [employer]. Sherbet or ice cream (keep to  cup per serving).Cream cheese. Fats and oils Unsalted butter or margarine. Other foods Homemade pudding. Sodium-free baking soda and baking powder. Herbs and spices.Low-sodium seasoning mixes. Beverages Coffee and tea. Carbonated beverages. The items listed above may not be a complete list of foods low in sodium. Actual amounts of sodium may be different depending on processing. Contact a dietitian for more information. What are some salt alternatives when cooking? The following are herbs, seasonings, and spices that can be used instead of salt to flavor your food. Herbs should be fresh or dried. Do not choose packaged mixes. Next to the name of the herb, spice, or seasoning aresome examples of foods you can pair it with. Herbs [place] leaves - Soups, meat and vegetable dishes, and spaghetti sauce. Basil - [employer], soups, pasta, and fish dishes. Cilantro - Meat, poultry, and vegetable dishes. Chili powder - Marinades and Mexican dishes. Chives - Salad dressings and potato dishes. Cumin - Mexican dishes, couscous, and meat dishes. Dill - Fish dishes, sauces, and salads. Fennel - Meat and vegetable dishes, breads, and cookies. Garlic (do not use garlic salt) - [place] dishes, meat dishes, salad dressings, and sauces. Marjoram - Soups, potato dishes, and meat dishes. Oregano - Pizza and spaghetti sauce. Parsley - Salads, soups, pasta, and meat dishes. Rosemary - [place] dishes, salad dressings, soups, and red meats. Saffron - Fish dishes, pasta, and some poultry dishes. Sage - Stuffings and sauces. Tarragon - Fish and [employer]. Thyme - Stuffing, meat, and fish dishes. Seasonings Lemon juice - Fish dishes, poultry dishes, vegetables, and salads. Vinegar - Salad dressings, vegetables, and fish dishes. Spices Cinnamon - Sweet  dishes, such as cakes, cookies, and puddings. Cloves - Gingerbread, puddings, and marinades for meats. Curry - Vegetable dishes, fish and poultry dishes, and stir-fry dishes. Ginger - Vegetable dishes, fish dishes, and stir-fry dishes. Nutmeg - Pasta, vegetables, poultry, fish dishes, and custard. Summary Cooking with less salt is one way to reduce the amount of sodium that you get from food. Buy sodium-free or low-sodium products. Check the food label before using or buying packaged ingredients. Use herbs, seasonings without salt, and spices as substitutes for salt in foods. This information is not intended to replace advice given to you by your health care provider. Make sure you discuss any questions you have with your healthcare provider. Document Revised: 09/16/2019 Document Reviewed: 09/16/2019 Elsevier Patient Education  2022 [employer]. https://www.diabeteseducator.org/docs/default-source/living-with-diabetes/conquering-the-grocery-store-v1.pdf?sfvrsn=4">  Carbohydrate Counting for Diabetes Mellitus, Adult Carbohydrate counting is a method of keeping track of how many carbohydrates you eat. Eating carbohydrates naturally increases the amount of sugar (glucose) in the blood. Counting how many carbohydrates you eat improves your bloodglucose control, which helps you manage your diabetes. It is important to know how many carbohydrates you can safely have in each meal. This is different for every person. A dietitian can help you make a meal plan and calculate how many carbohydrates you should have at each meal andsnack. What foods contain carbohydrates? Carbohydrates are found in the following foods: Grains, such as breads and cereals. Dried beans and soy products. Starchy vegetables, such as potatoes, peas, and corn. Fruit and fruit juices. Milk and yogurt. Sweets and snack foods, such as cake, cookies, candy, chips, and soft drinks. How do I count carbohydrates in  foods? There are  two ways to count carbohydrates in food. You can read food labels or learn standard serving sizes of foods. You can use either of the methods or acombination of both. Using the Nutrition Facts label The Nutrition Facts list is included on the labels of almost all packaged foods and beverages in the [place] It includes: The serving size. Information about nutrients in each serving, including the grams (g) of carbohydrate per serving. To use the Nutrition Facts: Decide how many servings you will have. Multiply the number of servings by the number of carbohydrates per serving. The resulting number is the total amount of carbohydrates that you will be having. Learning the standard serving sizes of foods When you eat carbohydrate foods that are not packaged or do not include Nutrition Facts on the label, you need to measure the servings in order to count the amount of carbohydrates. Measure the foods that you will eat with a food scale or measuring cup, if needed. Decide how many standard-size servings you will eat. Multiply the number of servings by 15. For foods that contain carbohydrates, one serving equals 15 g of carbohydrates. For example, if you eat 2 cups or 10 oz (300 g) of strawberries, you will have eaten 2 servings and 30 g of carbohydrates (2 servings x 15 g = 30 g). For foods that have more than one food mixed, such as soups and casseroles, you must count the carbohydrates in each food that is included. The following list contains standard serving sizes of common carbohydrate-rich foods. Each of these servings has about 15 g of carbohydrates: 1 slice of bread. 1 six-inch (15 cm) tortilla. ? cup or 2 oz (53 g) cooked rice or pasta.  cup or 3 oz (85 g) cooked or canned, drained and rinsed beans or lentils.  cup or 3 oz (85 g) starchy vegetable, such as peas, corn, or squash.  cup or 4 oz (120 g) hot cereal.  cup or 3 oz (85 g) boiled or mashed potatoes, or  or 3 oz (85 g) of a large  baked potato.  cup or 4 fl oz (118 mL) fruit juice. 1 cup or 8 fl oz (237 mL) milk. 1 small or 4 oz (106 g) apple.  or 2 oz (63 g) of a medium banana. 1 cup or 5 oz (150 g) strawberries. 3 cups or 1 oz (24 g) popped popcorn. What is an example of carbohydrate counting? To calculate the number of carbohydrates in this sample meal, follow the stepsshown below. Sample meal 3 oz (85 g) chicken breast. ? cup or 4 oz (106 g) brown rice.  cup or 3 oz (85 g) corn. 1 cup or 8 fl oz (237 mL) milk. 1 cup or 5 oz (150 g) strawberries with sugar-free whipped topping. Carbohydrate calculation Identify the foods that contain carbohydrates: Rice. Corn. Milk. Strawberries. Calculate how many servings you have of each food: 2 servings rice. 1 serving corn. 1 serving milk. 1 serving strawberries. Multiply each number of servings by 15 g: 2 servings rice x 15 g = 30 g. 1 serving corn x 15 g = 15 g. 1 serving milk x 15 g = 15 g. 1 serving strawberries x 15 g = 15 g. Add together all of the amounts to find the total grams of carbohydrates eaten: 30 g + 15 g + 15 g + 15 g = 75 g of carbohydrates total. What are tips for following this plan? Shopping Develop a meal  plan and then make a shopping list. Buy fresh and frozen vegetables, fresh and frozen fruit, dairy, eggs, beans, lentils, and whole grains. Look at food labels. Choose foods that have more fiber and less sugar. Avoid processed foods and foods with added sugars. Meal planning Aim to have the same amount of carbohydrates at each meal and for each snack time. Plan to have regular, balanced meals and snacks. Where to find more information American Diabetes Association: www.diabetes.org Centers for Disease Control and Prevention: [employer].com.br Summary Carbohydrate counting is a method of keeping track of how many carbohydrates you eat. Eating carbohydrates naturally increases the amount of sugar (glucose) in the blood. Counting how  many carbohydrates you eat improves your blood glucose control, which helps you manage your diabetes. A dietitian can help you make a meal plan and calculate how many carbohydrates you should have at each meal and snack. This information is not intended to replace advice given to you by your health care provider. Make sure you discuss any questions you have with your healthcare provider. Document Revised: 09/24/2019 Document Reviewed: 09/25/2019 Elsevier Patient Education  2021 [employer].

## 2021-04-20 NOTE — Progress Notes (Signed)
Established Patient Office Visit  Subjective:  Patient ID: Amy Pickethelma Marlene Housh, female    DOB: Mar 09, 1956  Age: 65 y.o. MRN: [id number]  CC:  Chief Complaint  Patient presents with   Annual Exam   Medication Refill    Trazodone and Ibuprofen    HPI Amy Knapp is a 65 year old female who has history of allergy, anxiety, asthma, COPD, hyperlipidemia ,hypertension, GERD, hypothyroidism presents for routine follow-up visit and medication refill.  She states that her breathing is stable, continues to smoke 4 cigarette daily and admits the desire to quit. She had Upper Endoscopy done and it showed Small hiatal hernia. - Normal stomach. - Normal examined duodenum. - Biopsy performed in the middle third of the esophagus.-Dilation performed in the entire esophagus. She denies dysphagia and odynophagia. Her Colonoscopy was done on 04/11/21 ,the examined portion of the ileum was normal. Biopsied. Diverticulosis in the sigmoid colon. - Random biopsies were obtained in the entire colon. She requests General surgery referral for the evaluation of the small hiatal hernia. Her blood glucose was 173 mg/dl during visit, she denies hypo. Hyperglycemic symptoms, performs daily foot checks and adheres to ADA diet. Overall, she states that she's doing well and offers no further complaint.    Past Medical History:  Diagnosis Date   Allergy    Anxiety    Arthritis    Asthma    Broken leg 2007   Right   Cataract    COPD (chronic obstructive pulmonary disease) (HCC)    Depression    Diabetes mellitus without complication (HCC)    Emphysema of lung (HCC)    Essential hypertension 04/20/2021   GERD (gastroesophageal reflux disease)    Gout    Hyperlipidemia    Sleep apnea    Thyroid disease     Past Surgical History:  Procedure Laterality Date   ABDOMINAL HYSTERECTOMY  2000   Partial   COLONOSCOPY WITH PROPOFOL N/A 04/11/2021   Procedure: COLONOSCOPY WITH PROPOFOL;  Surgeon: Midge MiniumWohl,  Darren, MD;  Location: [facility] ENDOSCOPY;  Service: Endoscopy;  Laterality: N/A;   ESOPHAGOGASTRODUODENOSCOPY N/A 04/11/2021   Procedure: ESOPHAGOGASTRODUODENOSCOPY (EGD);  Surgeon: Midge MiniumWohl, Darren, MD;  [facility];  Service: Endoscopy;  Laterality: N/A;   LEG SURGERY Right 10/08/2005   broken right leg   TUBAL LIGATION      Family History  Problem Relation Age of Onset   Cancer Mother    Heart attack Father    Heart attack Sister    Cancer Brother    Diabetes Daughter    Diabetes Son    Hypertension Maternal Aunt    Breast cancer Neg Hx     Social History   Socioeconomic History   Marital status: Divorced    Spouse name: Not on file   Number of children: 4   Years of education: Not on file   Highest education level: High school graduate  Occupational History   Occupation: Unemployed  Tobacco Use   Smoking status: Every Day    Packs/day: 0.15    Types: Cigarettes   Smokeless tobacco: Never   Tobacco comments:    Smoked half a cigarette this am 04/11/2021  Vaping Use   Vaping Use: Never used  Substance and Sexual Activity   Alcohol use: No   Drug use: No   Sexual activity: Not Currently  Other Topics Concern   Not on file  Social History Narrative   Not on file   Social Determinants of Health   Financial  Resource Strain: Not on file  Food Insecurity: Not on file  Transportation Needs: Not on file  Physical Activity: Not on file  Stress: Not on file  Social Connections: Not on file  Intimate Partner Violence: Not on file    Outpatient Medications Prior to Visit  Medication Sig Dispense Refill   Fluticasone-Umeclidin-Vilant 100-62.5-25 MCG/INH AEPB INHALE 1 PUFF INTO THE LUNGS EVERY DAY 60 each 3   glipiZIDE (GLUCOTROL) 5 MG tablet TAKE ONE TABLET BY MOUTH EVERY DAY 30 tablet 2   ibuprofen (ADVIL) 800 MG tablet TAKE ONE TABLET BY MOUTH 2 TIMES A DAY 28 tablet 0   levothyroxine (SYNTHROID) 100 MCG tablet TAKE ONE TABLET BY MOUTH 45 MINUTES BEFORE  BREAKFAST. 60 tablet 3   omeprazole (PRILOSEC) 20 MG capsule TAKE ONE CAPSULE BY MOUTH EVERY DAY 60 capsule 0   traZODone (DESYREL) 50 MG tablet TAKE ONE TABLET BY MOUTH AT BEDTIME 30 tablet 1   albuterol (VENTOLIN HFA) 108 (90 Base) MCG/ACT inhaler INHALE 2 PUFFS INTO THE LUNGS EVERY 6 HOURS AS NEEDED FOR WHEEZING OR SHORT OF BREATH 54 g 2   allopurinol (ZYLOPRIM) 100 MG tablet TAKE ONE TABLET BY MOUTH EVERY DAY 90 tablet 1   atorvastatin (LIPITOR) 10 MG tablet TAKE ONE TABLET BY MOUTH EVERY DAY 30 tablet 2   gabapentin (NEURONTIN) 400 MG capsule Take 1 capsule (400 mg total) by mouth 3 (three) times daily. 270 capsule 0   montelukast (SINGULAIR) 10 MG tablet TAKE ONE TABLET BY MOUTH AT BEDTIME 30 tablet 1   polyethylene glycol (GOLYTELY) 236 g solution Drink one 8 oz glass every 20 mins until entire container is finished starting at 5:00pm on 04/10/21 (Patient not taking: Reported on 04/11/2021) 4000 mL 0   No facility-administered medications prior to visit.    Allergies  Allergen Reactions   Celery Oil     itching   Demerol [Meperidine]     Burning, feels hot   Morphine And Related Other (See Comments)    Muscle Spasms   Penicillins     blisters    ROS Review of Systems  Constitutional: Negative.   HENT: Negative.    Respiratory: Negative.    Cardiovascular: Negative.   Gastrointestinal:  Negative for abdominal pain.  Endocrine: Negative.   Neurological: Negative.   Psychiatric/Behavioral: Negative.       Objective:    Physical Exam HENT:     Head: Normocephalic and atraumatic.     Mouth/Throat:     Mouth: Mucous membranes are moist.  Eyes:     Extraocular Movements: Extraocular movements intact.     Conjunctiva/sclera: Conjunctivae normal.     Pupils: Pupils are equal, round, and reactive to light.  Cardiovascular:     Rate and Rhythm: Normal rate and regular rhythm.     Pulses: Normal pulses.     Heart sounds: Normal heart sounds.  Pulmonary:     Effort:  Pulmonary effort is normal.     Breath sounds: Normal breath sounds.  Skin:    General: Skin is warm.  Neurological:     General: No focal deficit present.     Mental Status: She is alert and oriented to person, place, and time. Mental status is at baseline.  Psychiatric:        Mood and Affect: Mood normal.        Behavior: Behavior normal.        Thought Content: Thought content normal.        Judgment:  Judgment normal.    BP (!) 148/80 (BP Location: Right Arm)   Pulse 77   Temp 98.3 F (36.8 C)   Ht 5\' 1"  (1.549 m)   Wt 172 lb (78 kg)   SpO2 (!) 87%   BMI 32.50 kg/m  Wt Readings from Last 3 Encounters:  04/20/21 172 lb (78 kg)  04/11/21 170 lb (77.1 kg)  03/23/21 172 lb 6.4 oz (78.2 kg)   Encouraged weight loss  Health Maintenance Due  Topic Date Due   PNEUMOCOCCAL POLYSACCHARIDE VACCINE AGE 5-64 HIGH RISK  Never done   COVID-19 Vaccine (1) Never done   Pneumococcal Vaccine 8-39 Years old (1 - PCV) Never done   OPHTHALMOLOGY EXAM  Never done   HIV Screening  Never done   Hepatitis C Screening  Never done   TETANUS/TDAP  Never done   Zoster Vaccines- Shingrix (1 of 2) Never done   PAP SMEAR-Modifier  Never done   MAMMOGRAM  12/14/2020    There are no preventive care reminders to display for this patient.  Lab Results  Component Value Date   TSH 1.870 03/16/2021   Lab Results  Component Value Date   WBC 8.2 11/21/2020   HGB 14.6 11/21/2020   HCT 43.7 11/21/2020   MCV 90.5 11/21/2020   PLT 276 11/21/2020   Lab Results  Component Value Date   NA 137 11/21/2020   K 3.7 11/21/2020   CO2 25 11/21/2020   GLUCOSE 258 (H) 11/21/2020   BUN 11 11/21/2020   CREATININE 0.97 11/21/2020   BILITOT 0.3 07/28/2020   ALKPHOS 108 07/28/2020   AST 28 07/28/2020   ALT 37 (H) 07/28/2020   PROT 6.8 07/28/2020   ALBUMIN 4.4 07/28/2020   CALCIUM 9.3 11/21/2020   ANIONGAP 11 11/21/2020   Lab Results  Component Value Date   CHOL 179 07/28/2020   Lab Results   Component Value Date   HDL 46 07/28/2020   Lab Results  Component Value Date   LDLCALC 99 07/28/2020   Lab Results  Component Value Date   TRIG 199 (H) 07/28/2020   Lab Results  Component Value Date   CHOLHDL 4.0 03/16/2020   Lab Results  Component Value Date   HGBA1C 7.1 (A) 03/23/2021   HGBA1C 7.1 03/23/2021      Assessment & Plan:    1. Hiatal hernia She will follow up with- Ambulatory referral to General Surgery for evaluation of hiatal hernia.  2. Type 2 diabetes mellitus with diabetic neuropathy, without long-term current use of insulin (HCC) -Her hemoglobin A1c done on 03/23/2021 was 7.1%, she will continue on current medication, low-carb/no concentrated sweet diet and exercise as tolerated. - POCT Glucose (CBG); Future - gabapentin (NEURONTIN) 400 MG capsule; Take 1 capsule (400 mg total) by mouth 3 (three) times daily.  Dispense: 270 capsule; Refill: 0  3. Chronic obstructive pulmonary disease with acute exacerbation (HCC) -Her breathing is stable and she will continue on current medication.  She was encouraged on smoking cessation, and provided with [place] quit line information. - albuterol (VENTOLIN HFA) 108 (90 Base) MCG/ACT inhaler; INHALE 2 PUFFS INTO THE LUNGS EVERY 6 HOURS AS NEEDED FOR WHEEZING OR SHORT OF BREATH  Dispense: 54 g; Refill: 2  4. Gout of multiple sites, unspecified cause, unspecified chronicity -Her gout is under control,and she will continue on current medication.   - allopurinol (ZYLOPRIM) 100 MG tablet; TAKE ONE TABLET BY MOUTH EVERY DAY  Dispense: 90 tablet; Refill: 1  5.  Hyperlipidemia, unspecified hyperlipidemia type -She will continue on current medication, low-fat/cholesterol diet and exercise as tolerated. - atorvastatin (LIPITOR) 10 MG tablet; TAKE ONE TABLET BY MOUTH EVERY DAY  Dispense: 30 tablet; Refill: 2  6. Chronic obstructive asthma (HCC) -Her breathing is stable and she will continue on current medication.  She was encouraged  on smoking cessation, and provided with [place] quit line information - montelukast (SINGULAIR) 10 MG tablet; TAKE ONE TABLET BY MOUTH AT BEDTIME  Dispense: 30 tablet; Refill: 1  7. Essential hypertension -Her blood pressure was elevated and she was started on losartan 25 mg daily.  She was educated on medication side effects and advised to notify the clinic.  She will continue on DASH diet and exercise as tolerated. - losartan (COZAAR) 25 MG tablet; Take 1 tablet (25 mg total) by mouth once daily.  Dispense: 30 tablet; Refill: 0     Follow-up: Return in about 4 weeks (around 05/18/2021), or if symptoms worsen or fail to improve.    Clara Herbison Trellis Paganini, NP

## 2021-04-21 ENCOUNTER — Other Ambulatory Visit: Payer: Self-pay

## 2021-04-26 ENCOUNTER — Ambulatory Visit: Payer: Self-pay | Admitting: Licensed Clinical Social Worker

## 2021-04-26 ENCOUNTER — Other Ambulatory Visit: Payer: Self-pay

## 2021-04-26 ENCOUNTER — Telehealth: Payer: Self-pay

## 2021-04-26 DIAGNOSIS — F4323 Adjustment disorder with mixed anxiety and depressed mood: Secondary | ICD-10-CM

## 2021-04-26 NOTE — Telephone Encounter (Signed)
Social worker called patient to let her know that she could advocate for her in regard to her landlord once the social worker receives a release for her to do so, signed by the patient.  Left clinic phone number for her to call back.
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
## 2021-04-27 NOTE — Telephone Encounter (Signed)
Social worker called patient and left message letting her know that she could assist patient in regards to her landlord, if she was interested and left clinic phone number.

## 2021-05-02 ENCOUNTER — Ambulatory Visit: Payer: Self-pay | Admitting: Specialist

## 2021-05-04 ENCOUNTER — Other Ambulatory Visit: Payer: Self-pay

## 2021-05-04 ENCOUNTER — Ambulatory Visit: Payer: Self-pay

## 2021-05-04 ENCOUNTER — Ambulatory Visit: Payer: Self-pay | Admitting: Licensed Clinical Social Worker

## 2021-05-04 DIAGNOSIS — Z789 Other specified health status: Secondary | ICD-10-CM

## 2021-05-04 DIAGNOSIS — F4323 Adjustment disorder with mixed anxiety and depressed mood: Secondary | ICD-10-CM

## 2021-05-04 NOTE — BH Specialist Note (Signed)
Integrated Behavioral Health Follow Up In-Person Visit  MRN: [id number] Name: Amy Knapp   Total time: 60 minutes  Types of Service: Individual psychotherapy  Interpretor:No. Interpretor Name and Language: N/A  Subjective: Amy Knapp is a 65 y.o. female accompanied by  herself Patient was referred by Hurman Horn, NP for mental health. Patient reports the following symptoms/concerns: The patient notes she has had some hard days since her last follow-up session. She explained that she gave a female boss several gift cards and a Christmas card because she liked him and thought he liked her too and on Valentines Day she gave him several cards and things and he asked her why she was doing that and she told him not to worry about it if she did not want to give them to him she wouldn't. She notes that after some time she got the point he was just leading her on and then she found out he had a significant criminal history and that he was dangerous. She explained she Googled his name and his record came up. The patient shared that last week she found out a friend at work was dating him so she went to her and showed her the information she found online and her friend told her she didn't believe her. She explained that she was very distressed and she knows he is bad news. She stated she feels lucky to have found out before things went to far. She discussed financial stressors impacting her life and stressors in her current living situation. The patient denied any suicidal or homicidal thoughts.  Duration of problem: Years; Severity of problem: moderate  Objective: Mood: Irritable and Affect: Appropriate Risk of harm to self or others: No plan to harm self or others  Life Context: Family and Social: see above School/Work: see above Self-Care: see above Life Changes: see above  Patient and/or Family's Strengths/Protective Factors: Concrete supports in place (healthy food,  safe environments, etc.)  Goals Addressed: Patient will:  Reduce symptoms of: agitation, anxiety, depression, insomnia, and stress   Increase knowledge and/or ability of: coping skills, healthy habits, self-management skills, and stress reduction   Demonstrate ability to: Increase healthy adjustment to current life circumstances  Progress towards Goals: Ongoing  Interventions: Interventions utilized:  CBT Cognitive Behavioral Therapy was utilized by the clinician during today's follow up session. The clinician processed with the patient how they have been doing since the last follow-up session. The clinician provided a space for the patient to ventilate their frustrations regarding their current life circumstances. Clinician measured the patient's anxiety and depression on a numerical scale.  The clinician encouraged the patient to utilize their coping skills to deal with their current life circumstances. Clinician introduced distress tolerance skills to the patient and explained that negative emotions will usually lessen in intensity and pass over time. Clinician referred patient to social worker at [employer] for resources. Standardized Assessments completed: GAD-7 and PHQ 9 GAD-7      14 PHQ-9      13   Assessment: Patient currently experiencing see above.   Patient may benefit from see above.  Plan: Follow up with behavioral health clinician on : 05/18/2021 at 6:00 PM  Behavioral recommendations:  Referral(s): [facility] [employer] (In Clinic) "From scale of 1-10, how likely are you to follow plan?":   Judith Part, LCSWA

## 2021-05-04 NOTE — Progress Notes (Signed)
Patient came in for her appointment, signed a medical release for social worker to speak with her landlord about certain issues and patient was provided with Vocational Rehabilitation and Social Security information.  Social worker encouraged patient to call if she experiences any trouble navigating the resources that were provided to her.

## 2021-05-12 ENCOUNTER — Other Ambulatory Visit: Payer: Self-pay

## 2021-05-12 ENCOUNTER — Other Ambulatory Visit: Payer: Self-pay | Admitting: Gerontology

## 2021-05-12 DIAGNOSIS — K219 Gastro-esophageal reflux disease without esophagitis: Secondary | ICD-10-CM

## 2021-05-12 MED ORDER — OMEPRAZOLE 20 MG PO CPDR
DELAYED_RELEASE_CAPSULE | Freq: Every day | ORAL | 0 refills | Status: DC
  Filled 2021-05-12: qty 60, 60d supply, fill #0

## 2021-05-14 ENCOUNTER — Other Ambulatory Visit: Payer: Self-pay

## 2021-05-14 ENCOUNTER — Encounter: Payer: Self-pay | Admitting: Emergency Medicine

## 2021-05-14 ENCOUNTER — Emergency Department
Admission: EM | Admit: 2021-05-14 | Discharge: 2021-05-14 | Disposition: A | Discharge status: Home / Self Care | Payer: Self-pay | Attending: Emergency Medicine | Admitting: Emergency Medicine

## 2021-05-14 DIAGNOSIS — E1140 Type 2 diabetes mellitus with diabetic neuropathy, unspecified: Secondary | ICD-10-CM | POA: Insufficient documentation

## 2021-05-14 DIAGNOSIS — J449 Chronic obstructive pulmonary disease, unspecified: Secondary | ICD-10-CM | POA: Insufficient documentation

## 2021-05-14 DIAGNOSIS — F17210 Nicotine dependence, cigarettes, uncomplicated: Secondary | ICD-10-CM | POA: Insufficient documentation

## 2021-05-14 DIAGNOSIS — E039 Hypothyroidism, unspecified: Secondary | ICD-10-CM | POA: Insufficient documentation

## 2021-05-14 DIAGNOSIS — J45909 Unspecified asthma, uncomplicated: Secondary | ICD-10-CM | POA: Insufficient documentation

## 2021-05-14 DIAGNOSIS — E1136 Type 2 diabetes mellitus with diabetic cataract: Secondary | ICD-10-CM | POA: Insufficient documentation

## 2021-05-14 DIAGNOSIS — Z7951 Long term (current) use of inhaled steroids: Secondary | ICD-10-CM | POA: Insufficient documentation

## 2021-05-14 DIAGNOSIS — Z79899 Other long term (current) drug therapy: Secondary | ICD-10-CM | POA: Insufficient documentation

## 2021-05-14 DIAGNOSIS — E1169 Type 2 diabetes mellitus with other specified complication: Secondary | ICD-10-CM | POA: Insufficient documentation

## 2021-05-14 DIAGNOSIS — H6982 Other specified disorders of Eustachian tube, left ear: Secondary | ICD-10-CM | POA: Insufficient documentation

## 2021-05-14 DIAGNOSIS — E785 Hyperlipidemia, unspecified: Secondary | ICD-10-CM | POA: Insufficient documentation

## 2021-05-14 DIAGNOSIS — Z7984 Long term (current) use of oral hypoglycemic drugs: Secondary | ICD-10-CM | POA: Insufficient documentation

## 2021-05-14 MED ORDER — CEPHALEXIN 500 MG PO CAPS: 500.0000 mg | ORAL_CAPSULE | Freq: Three times a day (TID) | ORAL | 0 refills | Status: AC

## 2021-05-14 MED ORDER — PSEUDOEPHEDRINE HCL 30 MG PO TABS: 30.0000 mg | ORAL_TABLET | Freq: Four times a day (QID) | ORAL | 0 refills | Status: DC | PRN

## 2021-05-14 MED ORDER — FLUTICASONE PROPIONATE 50 MCG/ACT NA SUSP: 2.0000 | Freq: Every day | NASAL | 0 refills | Status: DC

## 2021-05-14 NOTE — ED Triage Notes (Signed)
Pt reports pain to left ear for several weeks. Pt states that it started as just itching but the Friday the pain kicked in.

## 2021-05-14 NOTE — ED Provider Notes (Signed)
[facility] Emergency Department Provider Note ____________________________________________  Time seen: Approximately 11:42 AM  I have reviewed the triage vital signs and the nursing notes.   HISTORY  Chief Complaint Otalgia    HPI Amy Knapp is a 65 y.o. female presents to the emergency department for treatment and evaluation of left earache that has been worsening over the past few weeks. Pain acutely worsened over the past couple of days. Somewhat decrease in hearing. No drainage from the ear. No fever.   Past Medical History:  Diagnosis Date   Allergy    Anxiety    Arthritis    Asthma    Broken leg 2007   Right   Cataract    COPD (chronic obstructive pulmonary disease) (HCC)    Depression    Diabetes mellitus without complication (HCC)    Emphysema of lung (HCC)    Essential hypertension 04/20/2021   GERD (gastroesophageal reflux disease)    Gout    Hyperlipidemia    Sleep apnea    Thyroid disease     Patient Active Problem List   Diagnosis Date Noted   Hiatal hernia 04/20/2021   Essential hypertension 04/20/2021   Diarrhea    Dysphagia    Right knee pain 03/23/2021   Acute leg pain, left 12/22/2020   Type 2 diabetes mellitus with diabetic neuropathy, unspecified (HCC) 03/10/2020   Nipple discharge 03/10/2020   Flu vaccine need 11/13/2016   Hyperlipidemia 08/14/2016   Edema extremities 08/14/2016   Lower back pain 12/09/2014   Hypothyroidism 11/11/2014   COPD (chronic obstructive pulmonary disease) (HCC) 11/11/2014   Gout 11/11/2014   Seborrheic keratosis 10/28/2014   Diabetes (HCC) 09/14/2014   Sleep apnea 06/29/2014    Past Surgical History:  Procedure Laterality Date   ABDOMINAL HYSTERECTOMY  2000   Partial   COLONOSCOPY WITH PROPOFOL N/A 04/11/2021   Procedure: COLONOSCOPY WITH PROPOFOL;  Surgeon: Midge Minium, MD;  Location: [facility] ENDOSCOPY;  Service: Endoscopy;  Laterality: N/A;   ESOPHAGOGASTRODUODENOSCOPY  N/A 04/11/2021   Procedure: ESOPHAGOGASTRODUODENOSCOPY (EGD);  Surgeon: Midge Minium, MD;  Location: [facility] ENDOSCOPY;  Service: Endoscopy;  Laterality: N/A;   LEG SURGERY Right 10/08/2005   broken right leg   TUBAL LIGATION      Prior to Admission medications   Medication Sig Start Date End Date Taking? Authorizing Provider  cephALEXin (KEFLEX) 500 MG capsule Take 1 capsule (500 mg total) by mouth 3 (three) times daily for 10 days. 05/14/21 05/24/21 Yes Meily Glowacki B, FNP  fluticasone (FLONASE) 50 MCG/ACT nasal spray Place 2 sprays into both nostrils daily for 14 days. 05/14/21 05/28/21 Yes Elonna Mcfarlane B, FNP  pseudoephedrine (SUDAFED) 30 MG tablet Take 1 tablet (30 mg total) by mouth every 6 (six) hours as needed for congestion. 05/14/21 05/14/22 Yes Chloe Bluett B, FNP  albuterol (VENTOLIN HFA) 108 (90 Base) MCG/ACT inhaler INHALE 2 PUFFS INTO THE LUNGS EVERY 6 HOURS AS NEEDED FOR WHEEZING OR SHORT OF BREATH 04/20/21 04/20/22  Iloabachie, Chioma E, NP  allopurinol (ZYLOPRIM) 100 MG tablet TAKE ONE TABLET BY MOUTH EVERY DAY 04/20/21 09/03/21  Iloabachie, Chioma E, NP  atorvastatin (LIPITOR) 10 MG tablet TAKE ONE TABLET BY MOUTH EVERY DAY 04/20/21 03/18/22  Iloabachie, Chioma E, NP  Fluticasone-Umeclidin-Vilant 100-62.5-25 MCG/INH AEPB INHALE 1 PUFF INTO THE LUNGS EVERY DAY 12/20/20 12/20/21  Iloabachie, Chioma E, NP  gabapentin (NEURONTIN) 400 MG capsule Take 1 capsule (400 mg total) by mouth 3 (three) times daily. 04/20/21 07/19/21  Iloabachie, Chioma E,  NP  glipiZIDE (GLUCOTROL) 5 MG tablet TAKE ONE TABLET BY MOUTH EVERY DAY 03/23/21 03/23/22  Iloabachie, Chioma E, NP  ibuprofen (ADVIL) 800 MG tablet TAKE ONE TABLET BY MOUTH 2 TIMES A DAY 03/23/21 03/23/22  Iloabachie, Chioma E, NP  levothyroxine (SYNTHROID) 100 MCG tablet TAKE ONE TABLET BY MOUTH 45 MINUTES BEFORE BREAKFAST. 03/23/21 09/06/21  Iloabachie, Chioma E, NP  losartan (COZAAR) 25 MG tablet Take 1 tablet (25 mg total) by mouth once daily. 04/20/21    Iloabachie, Chioma E, NP  montelukast (SINGULAIR) 10 MG tablet TAKE ONE TABLET BY MOUTH AT BEDTIME 04/20/21 04/20/22  Iloabachie, Chioma E, NP  omeprazole (PRILOSEC) 20 MG capsule TAKE ONE CAPSULE BY MOUTH EVERY DAY 05/12/21 11/24/21  Iloabachie, Chioma E, NP  traZODone (DESYREL) 50 MG tablet TAKE ONE TABLET BY MOUTH AT BEDTIME 03/23/21 06/11/21  Iloabachie, Chioma E, NP    Allergies Celery oil, Demerol [meperidine], Morphine and related, and Penicillins  Family History  Problem Relation Age of Onset   Cancer Mother    Heart attack Father    Heart attack Sister    Cancer Brother    Diabetes Daughter    Diabetes Son    Hypertension Maternal Aunt    Breast cancer Neg Hx     Social History Social History   Tobacco Use   Smoking status: Every Day    Packs/day: 0.15    Types: Cigarettes   Smokeless tobacco: Never   Tobacco comments:    Smoked half a cigarette this am 04/11/2021  Vaping Use   Vaping Use: Never used  Substance Use Topics   Alcohol use: No   Drug use: No    Review of Systems Constitutional: Negative for fever. Positive for decreased ability to hear from left ear(s). Eyes: Negative for discharge or drainage. ENT:       Positive for otalgia in left ear(s).      Negative for rhinorrhea or congestion.      Negative for sore throat. Gastrointestinal: Negative for nausea, vomiting, or diarrhea. Musculoskeletal: Negative for myalgias. Skin: Negative for rash, lesions, or wounds. Neurological: Negative for paresthesias. ____________________________________________   PHYSICAL EXAM:  VITAL SIGNS: ED Triage Vitals  Enc Vitals Group     BP 05/14/21 0916 (!) 160/89     Pulse Rate 05/14/21 0916 85     Resp 05/14/21 0916 18     Temp 05/14/21 0916 98.2 F (36.8 C)     Temp Source 05/14/21 0916 Oral     SpO2 05/14/21 0916 95 %     Weight 05/14/21 0847 165 lb (74.8 kg)     Height 05/14/21 0847 5\' 5"  (1.651 m)     Head Circumference --      Peak Flow --      Pain Score  05/14/21 0847 9     Pain Loc --      Pain Edu? --      Excl. in GC? --     Constitutional: Overall well appearing. Eyes: Conjunctivae are clear without discharge or drainage. Ears:       Right TM: Normal.      Left TM: Middle ear fluid, retracted TM, mild erythema. Head: Atraumatic. Nose: No rhinorrhea or sinus pain on percussion. Mouth/Throat: Oropharynx normal. Tonsils without exudate. Hematological/Lymphatic/Immunilogical: No palpable anterior cervical lymphadenopathy. Cardiovascular: Heart rate and rhythm are regular without murmur, gallop, or rub appreciated. Respiratory: Breath sounds are clear throughout to auscultation.  Neurologic:  Alert and oriented x 4. Skin: Intact and  without rash, lesion, or wound on exposed skin surfaces. ____________________________________________   LABS (all labs ordered are listed, but only abnormal results are displayed)  Labs Reviewed - No data to display ____________________________________________   RADIOLOGY  Not indicated ____________________________________________   PROCEDURES  Procedure(s) performed:   Procedures  ____________________________________________   INITIAL IMPRESSION / ASSESSMENT AND PLAN / ED COURSE  65 year old female presenting to the emergency department for treatment and evaluation of left otalgia.  See HPI and exam findings for details.  Exam is consistent with eustachian tube dysfunction.  She will be treated as such.  Although no true purulent otitis media identified on exam today, patient is very intent on receiving antibiotic therapy.  Keflex prescribed as she is allergic to penicillins.  Patient was encouraged to follow-up with her primary care provider for symptoms that are not improving over the next 48 hours.  She was encouraged to return to the emergency department for symptoms of change or worsen if she is unable to schedule appointment.  Pertinent labs & imaging results that were available  during my care of the patient were reviewed by me and considered in my medical decision making (see chart for details). ____________________________________________   FINAL CLINICAL IMPRESSION(S) / ED DIAGNOSES  Final diagnoses:  Acute dysfunction of Eustachian tube, left    ED Discharge Orders          Ordered    fluticasone (FLONASE) 50 MCG/ACT nasal spray  Daily        05/14/21 1155    cephALEXin (KEFLEX) 500 MG capsule  3 times daily        05/14/21 1155    pseudoephedrine (SUDAFED) 30 MG tablet  Every 6 hours PRN        05/14/21 1155            If controlled substance prescribed during this visit, 12 month history viewed on the [facility] prior to issuing an initial prescription for Schedule II or III opiod.   Note:  This document was prepared using Dragon voice recognition software and may include unintentional dictation errors.     Chinita Pester, FNP 05/14/21 1414    Sharyn Creamer, MD 05/14/21 2038

## 2021-05-14 NOTE — ED Notes (Signed)
See triage note   Presents with left ear pain  States pain started several days ago  No fever  Started out with itching

## 2021-05-15 ENCOUNTER — Other Ambulatory Visit: Payer: Self-pay

## 2021-05-18 ENCOUNTER — Ambulatory Visit: Payer: Self-pay

## 2021-05-18 ENCOUNTER — Ambulatory Visit: Payer: Self-pay | Admitting: Licensed Clinical Social Worker

## 2021-05-18 ENCOUNTER — Ambulatory Visit: Payer: Self-pay | Admitting: Gerontology

## 2021-05-18 ENCOUNTER — Other Ambulatory Visit: Payer: Self-pay

## 2021-05-18 VITALS — BP 119/71 | HR 70 | Temp 98.1°F | Ht 61.0 in | Wt 163.0 lb

## 2021-05-18 DIAGNOSIS — E1140 Type 2 diabetes mellitus with diabetic neuropathy, unspecified: Secondary | ICD-10-CM

## 2021-05-18 DIAGNOSIS — K449 Diaphragmatic hernia without obstruction or gangrene: Secondary | ICD-10-CM

## 2021-05-18 DIAGNOSIS — F4310 Post-traumatic stress disorder, unspecified: Secondary | ICD-10-CM

## 2021-05-18 DIAGNOSIS — R222 Localized swelling, mass and lump, trunk: Secondary | ICD-10-CM | POA: Insufficient documentation

## 2021-05-18 DIAGNOSIS — I10 Essential (primary) hypertension: Secondary | ICD-10-CM

## 2021-05-18 MED ORDER — LOSARTAN POTASSIUM 25 MG PO TABS
25.0000 mg | ORAL_TABLET | Freq: Every day | ORAL | 2 refills | Status: DC
  Filled 2021-05-18 – 2021-05-26 (×2): qty 30, 30d supply, fill #0
  Filled 2021-07-19: qty 30, 30d supply, fill #1
  Filled 2021-08-18: qty 30, 30d supply, fill #2

## 2021-05-18 NOTE — Progress Notes (Signed)
Established Patient Office Visit  Subjective:  Patient ID: Amy Knapp, female    DOB: [birth year]  Age: 65 y.o. MRN: [id number]  CC:  Chief Complaint  Patient presents with   Follow-up    Patient states this is a regular follow up and would also like to discuss her back pain. She states she has a "hernia" and it is causing her a lot of discomfort.     HPI Amy Knapp is a 65 year old female who has history of allergy, anxiety, arthritis, asthma, type 2 diabetes mellitus, COPD, hypertension, hyperlipidemia presents for follow-up of hypertension. She was started on Losartan 25 mg daily for elevated blood pressure, she states that she tolerated medication and denies side effects. She was seen at the ED on 05/14/21 for Otalgia and was treated for  Acute Eustachian dysfunction and was prescribed Keflex. She had Upper Endoscopy that showed small hertal hernia and states that she's experiencing intermittent mid quadrant abdominal pain that has been going on for one week. She describes the pain as a dull sharp 5/10, and lifting objects at work aggravates symptoms. She states that sitting relieves symptoms. She requests General Surgery referral for evaluation. She also c/o painful lump that she noticed on her mid back for 2 weeks. It measures 0.8 cm x 0.5cm. It's painful when touched, hard and non mobile.  She denies fever, chills, chest pain, palpitation and dizziness.  Overall, she states that she is doing well and once the hernia removed.  Past Medical History:  Diagnosis Date   Allergy    Anxiety    Arthritis    Asthma    Broken leg 2007   Right   Cataract    COPD (chronic obstructive pulmonary disease) (HCC)    Depression    Diabetes mellitus without complication (HCC)    Emphysema of lung (HCC)    Essential hypertension 04/20/2021   GERD (gastroesophageal reflux disease)    Gout    Hyperlipidemia    Sleep apnea    Thyroid disease     Past Surgical History:   Procedure Laterality Date   ABDOMINAL HYSTERECTOMY  2000   Partial   COLONOSCOPY WITH PROPOFOL N/A 04/11/2021   Procedure: COLONOSCOPY WITH PROPOFOL;  Surgeon: Midge Minium, MD;  Location: [facility] ENDOSCOPY;  Service: Endoscopy;  Laterality: N/A;   ESOPHAGOGASTRODUODENOSCOPY N/A 04/11/2021   Procedure: ESOPHAGOGASTRODUODENOSCOPY (EGD);  Surgeon: Midge Minium, MD;  Location: [facility] ENDOSCOPY;  Service: Endoscopy;  Laterality: N/A;   LEG SURGERY Right 10/08/2005   broken right leg   TUBAL LIGATION      Family History  Problem Relation Age of Onset   Cancer Mother    Heart attack Father    Heart attack Sister    Cancer Brother    Diabetes Daughter    Diabetes Son    Hypertension Maternal Aunt    Breast cancer Neg Hx     Social History   Socioeconomic History   Marital status: Divorced    Spouse name: Not on file   Number of children: 4   Years of education: Not on file   Highest education level: High school graduate  Occupational History   Occupation: Unemployed  Tobacco Use   Smoking status: Every Day    Packs/day: 0.15    Types: Cigarettes   Smokeless tobacco: Never   Tobacco comments:    Down to about 2 cigarettes a day  Vaping Use   Vaping Use: Never used  Substance and Sexual Activity  Alcohol use: No   Drug use: No   Sexual activity: Not Currently  Other Topics Concern   Not on file  Social History Narrative   Not on file   Social Determinants of Health   Financial Resource Strain: Not on file  Food Insecurity: Not on file  Transportation Needs: Not on file  Physical Activity: Not on file  Stress: Not on file  Social Connections: Not on file  Intimate Partner Violence: Not on file    Outpatient Medications Prior to Visit  Medication Sig Dispense Refill   albuterol (VENTOLIN HFA) 108 (90 Base) MCG/ACT inhaler INHALE 2 PUFFS INTO THE LUNGS EVERY 6 HOURS AS NEEDED FOR WHEEZING OR SHORT OF BREATH 54 g 2   allopurinol (ZYLOPRIM) 100 MG tablet TAKE ONE TABLET  BY MOUTH EVERY DAY 90 tablet 1   atorvastatin (LIPITOR) 10 MG tablet TAKE ONE TABLET BY MOUTH EVERY DAY 30 tablet 2   cephALEXin (KEFLEX) 500 MG capsule Take 1 capsule (500 mg total) by mouth 3 (three) times daily for 10 days. 30 capsule 0   fluticasone (FLONASE) 50 MCG/ACT nasal spray Place 2 sprays into both nostrils daily for 14 days. 9.9 mL 0   Fluticasone-Umeclidin-Vilant 100-62.5-25 MCG/INH AEPB INHALE 1 PUFF INTO THE LUNGS EVERY DAY 60 each 3   gabapentin (NEURONTIN) 400 MG capsule Take 1 capsule (400 mg total) by mouth 3 (three) times daily. 270 capsule 0   ibuprofen (ADVIL) 800 MG tablet TAKE ONE TABLET BY MOUTH 2 TIMES A DAY 28 tablet 0   levothyroxine (SYNTHROID) 100 MCG tablet TAKE ONE TABLET BY MOUTH 45 MINUTES BEFORE BREAKFAST. 60 tablet 3   montelukast (SINGULAIR) 10 MG tablet TAKE ONE TABLET BY MOUTH AT BEDTIME 30 tablet 1   pseudoephedrine (SUDAFED) 30 MG tablet Take 1 tablet (30 mg total) by mouth every 6 (six) hours as needed for congestion. 24 tablet 0   traZODone (DESYREL) 50 MG tablet TAKE ONE TABLET BY MOUTH AT BEDTIME 30 tablet 1   losartan (COZAAR) 25 MG tablet Take 1 tablet (25 mg total) by mouth once daily. 30 tablet 0   glipiZIDE (GLUCOTROL) 5 MG tablet TAKE ONE TABLET BY MOUTH EVERY DAY (Patient not taking: Reported on 05/18/2021) 30 tablet 2   omeprazole (PRILOSEC) 20 MG capsule TAKE ONE CAPSULE BY MOUTH ONCE DAILY (Patient not taking: Reported on 05/18/2021) 60 capsule 0   No facility-administered medications prior to visit.    Allergies  Allergen Reactions   Celery Oil     itching   Demerol [Meperidine]     Burning, feels hot   Morphine And Related Other (See Comments)    Muscle Spasms   Penicillins     blisters    ROS Review of Systems  Constitutional: Negative.   Respiratory: Negative.    Cardiovascular: Negative.   Gastrointestinal:  Positive for abdominal pain.  Neurological: Negative.      Objective:    Physical Exam HENT:     Head:  Normocephalic and atraumatic.  Cardiovascular:     Rate and Rhythm: Normal rate and regular rhythm.     Pulses: Normal pulses.     Heart sounds: Normal heart sounds.  Pulmonary:     Effort: Pulmonary effort is normal.     Breath sounds: Normal breath sounds.  Abdominal:     Hernia: A hernia (tenderness with palpation to mid abdominal quadrant.) is present.  Skin:      Neurological:     General: No focal deficit  present.     Mental Status: She is alert and oriented to person, place, and time. Mental status is at baseline.  Psychiatric:        Mood and Affect: Mood normal.        Behavior: Behavior normal.        Thought Content: Thought content normal.        Judgment: Judgment normal.    BP 119/71   Pulse 70   Temp 98.1 F (36.7 C)   Ht 5\' 1"  (1.549 m)   Wt 163 lb (73.9 kg)   SpO2 91%   BMI 30.80 kg/m  Wt Readings from Last 3 Encounters:  05/18/21 163 lb (73.9 kg)  05/14/21 165 lb (74.8 kg)  04/20/21 172 lb (78 kg)   Encouraged weight loss.  Health Maintenance Due  Topic Date Due   PNEUMOCOCCAL POLYSACCHARIDE VACCINE AGE 22-64 HIGH RISK  Never done   COVID-19 Vaccine (1) Never done   Pneumococcal Vaccine 43-57 Years old (1 - PCV) Never done   OPHTHALMOLOGY EXAM  Never done   HIV Screening  Never done   Hepatitis C Screening  Never done   TETANUS/TDAP  Never done   Zoster Vaccines- Shingrix (1 of 2) Never done   PAP SMEAR-Modifier  Never done   MAMMOGRAM  12/14/2020   INFLUENZA VACCINE  05/08/2021    There are no preventive care reminders to display for this patient.  Lab Results  Component Value Date   TSH 1.870 03/16/2021   Lab Results  Component Value Date   WBC 8.2 11/21/2020   HGB 14.6 11/21/2020   HCT 43.7 11/21/2020   MCV 90.5 11/21/2020   PLT 276 11/21/2020   Lab Results  Component Value Date   NA 137 11/21/2020   K 3.7 11/21/2020   CO2 25 11/21/2020   GLUCOSE 258 (H) 11/21/2020   BUN 11 11/21/2020   CREATININE 0.97 11/21/2020   BILITOT  0.3 07/28/2020   ALKPHOS 108 07/28/2020   AST 28 07/28/2020   ALT 37 (H) 07/28/2020   PROT 6.8 07/28/2020   ALBUMIN 4.4 07/28/2020   CALCIUM 9.3 11/21/2020   ANIONGAP 11 11/21/2020   Lab Results  Component Value Date   CHOL 179 07/28/2020   Lab Results  Component Value Date   HDL 46 07/28/2020   Lab Results  Component Value Date   LDLCALC 99 07/28/2020   Lab Results  Component Value Date   TRIG 199 (H) 07/28/2020   Lab Results  Component Value Date   CHOLHDL 4.0 03/16/2020   Lab Results  Component Value Date   HGBA1C 7.1 (A) 03/23/2021   HGBA1C 7.1 03/23/2021      Assessment & Plan:    1. Essential hypertension -Her blood pressure is under control, she will continue on current medication regimen, DASH diet and exercise as tolerated. - losartan (COZAAR) 25 MG tablet; Take 1 tablet (25 mg total) by mouth once daily.  Dispense: 30 tablet; Refill: 2  2. Type 2 diabetes mellitus with diabetic neuropathy, without long-term current use of insulin (HCC) -Her hemoglobin A1c was 7.1%, will recheck in future.  She will continue on current medication, advised on low-carb/no concentrated sweet diet. - HgB A1c; Future  3. Hiatal hernia -She was advised to go to the emergency room for worsening abdominal pain and she will be referred to general surgery.  4. Lump of skin of back -She was advised to go to the emergency room for worsening symptoms and she  will follow-up with - Ambulatory referral to General Surgery    Follow-up: Return in about 5 weeks (around 06/22/2021), or if symptoms worsen or fail to improve.    Amy Kincer Trellis Paganini, NP

## 2021-05-18 NOTE — Patient Instructions (Signed)
https://www.nhlbi.nih.gov/files/docs/public/heart/dash_brief.pdf">  DASH Eating Plan DASH stands for Dietary Approaches to Stop Hypertension. The DASH eating plan is a healthy eating plan that has been shown to: Reduce high blood pressure (hypertension). Reduce your risk for type 2 diabetes, heart disease, and stroke. Help with weight loss. What are tips for following this plan? Reading food labels Check food labels for the amount of salt (sodium) per serving. Choose foods with less than 5 percent of the Daily Value of sodium. Generally, foods with less than 300 milligrams (mg) of sodium per serving fit into this eating plan. To find whole grains, look for the word "whole" as the first word in the ingredient list. Shopping Buy products labeled as "low-sodium" or "no salt added." Buy fresh foods. Avoid canned foods and pre-made or frozen meals. Cooking Avoid adding salt when cooking. Use salt-free seasonings or herbs instead of table salt or sea salt. Check with your health care provider or pharmacist before using salt substitutes. Do not fry foods. Cook foods using healthy methods such as baking, boiling, grilling, roasting, and broiling instead. Cook with heart-healthy oils, such as olive, canola, avocado, soybean, or sunflower oil. Meal planning  Eat a balanced diet that includes: 4 or more servings of fruits and 4 or more servings of vegetables each day. Try to fill one-half of your plate with fruits and vegetables. 6-8 servings of whole grains each day. Less than 6 oz (170 g) of lean meat, poultry, or fish each day. A 3-oz (85-g) serving of meat is about the same size as a deck of cards. One egg equals 1 oz (28 g). 2-3 servings of low-fat dairy each day. One serving is 1 cup (237 mL). 1 serving of nuts, seeds, or beans 5 times each week. 2-3 servings of heart-healthy fats. Healthy fats called omega-3 fatty acids are found in foods such as walnuts, flaxseeds, fortified milks, and eggs.  These fats are also found in cold-water fish, such as sardines, salmon, and mackerel. Limit how much you eat of: Canned or prepackaged foods. Food that is high in trans fat, such as some fried foods. Food that is high in saturated fat, such as fatty meat. Desserts and other sweets, sugary drinks, and other foods with added sugar. Full-fat dairy products. Do not salt foods before eating. Do not eat more than 4 egg yolks a week. Try to eat at least 2 vegetarian meals a week. Eat more home-cooked food and less restaurant, buffet, and fast food.  Lifestyle When eating at a restaurant, ask that your food be prepared with less salt or no salt, if possible. If you drink alcohol: Limit how much you use to: 0-1 drink a day for women who are not pregnant. 0-2 drinks a day for men. Be aware of how much alcohol is in your drink. In the [place] one drink equals one 12 oz bottle of beer (355 mL), one 5 oz glass of wine (148 mL), or one 1 oz glass of hard liquor (44 mL). General information Avoid eating more than 2,300 mg of salt a day. If you have hypertension, you may need to reduce your sodium intake to 1,500 mg a day. Work with your health care provider to maintain a healthy body weight or to lose weight. Ask what an ideal weight is for you. Get at least 30 minutes of exercise that causes your heart to beat faster (aerobic exercise) most days of the week. Activities may include walking, swimming, or biking. Work with your health care provider   or dietitian to adjust your eating plan to your individual calorie needs. What foods should I eat? Fruits All fresh, dried, or frozen fruit. Canned fruit in natural juice (without addedsugar). Vegetables Fresh or frozen vegetables (raw, steamed, roasted, or grilled). Low-sodium or reduced-sodium tomato and vegetable juice. Low-sodium or reduced-sodium tomatosauce and tomato paste. Low-sodium or reduced-sodium canned vegetables. Grains Whole-grain or  whole-wheat bread. Whole-grain or whole-wheat pasta. Brown rice. Oatmeal. Quinoa. Bulgur. Whole-grain and low-sodium cereals. Pita bread.Low-fat, low-sodium crackers. Whole-wheat flour tortillas. Meats and other proteins Skinless chicken or turkey. Ground chicken or turkey. Pork with fat trimmed off. Fish and seafood. Egg whites. Dried beans, peas, or lentils. Unsalted nuts, nut butters, and seeds. Unsalted canned beans. Lean cuts of beef with fat trimmed off. Low-sodium, lean precooked or cured meat, such as sausages or meatloaves. Dairy Low-fat (1%) or fat-free (skim) milk. Reduced-fat, low-fat, or fat-free cheeses. Nonfat, low-sodium ricotta or cottage cheese. Low-fat or nonfatyogurt. Low-fat, low-sodium cheese. Fats and oils Soft margarine without trans fats. Vegetable oil. Reduced-fat, low-fat, or light mayonnaise and salad dressings (reduced-sodium). Canola, safflower, olive, avocado, soybean, andsunflower oils. Avocado. Seasonings and condiments Herbs. Spices. Seasoning mixes without salt. Other foods Unsalted popcorn and pretzels. Fat-free sweets. The items listed above may not be a complete list of foods and beverages you can eat. Contact a dietitian for more information. What foods should I avoid? Fruits Canned fruit in a light or heavy syrup. Fried fruit. Fruit in cream or buttersauce. Vegetables Creamed or fried vegetables. Vegetables in a cheese sauce. Regular canned vegetables (not low-sodium or reduced-sodium). Regular canned tomato sauce and paste (not low-sodium or reduced-sodium). Regular tomato and vegetable juice(not low-sodium or reduced-sodium). Pickles. Olives. Grains Baked goods made with fat, such as croissants, muffins, or some breads. Drypasta or rice meal packs. Meats and other proteins Fatty cuts of meat. Ribs. Fried meat. Bacon. Bologna, salami, and other precooked or cured meats, such as sausages or meat loaves. Fat from the back of a pig (fatback). Bratwurst.  Salted nuts and seeds. Canned beans with added salt. Canned orsmoked fish. Whole eggs or egg yolks. Chicken or turkey with skin. Dairy Whole or 2% milk, cream, and half-and-half. Whole or full-fat cream cheese. Whole-fat or sweetened yogurt. Full-fat cheese. Nondairy creamers. Whippedtoppings. Processed cheese and cheese spreads. Fats and oils Butter. Stick margarine. Lard. Shortening. Ghee. Bacon fat. Tropical oils, suchas coconut, palm kernel, or palm oil. Seasonings and condiments Onion salt, garlic salt, seasoned salt, table salt, and sea salt. Worcestershire sauce. Tartar sauce. Barbecue sauce. Teriyaki sauce. Soy sauce, including reduced-sodium. Steak sauce. Canned and packaged gravies. Fish sauce. Oyster sauce. Cocktail sauce. Store-bought horseradish. Ketchup. Mustard. Meat flavorings and tenderizers. Bouillon cubes. Hot sauces. Pre-made or packaged marinades. Pre-made or packaged taco seasonings. Relishes. Regular saladdressings. Other foods Salted popcorn and pretzels. The items listed above may not be a complete list of foods and beverages you should avoid. Contact a dietitian for more information. Where to find more information National Heart, Lung, and Blood Institute: www.nhlbi.nih.gov American Heart Association: www.heart.org Academy of Nutrition and Dietetics: www.eatright.org National Kidney Foundation: www.kidney.org Summary The DASH eating plan is a healthy eating plan that has been shown to reduce high blood pressure (hypertension). It may also reduce your risk for type 2 diabetes, heart disease, and stroke. When on the DASH eating plan, aim to eat more fresh fruits and vegetables, whole grains, lean proteins, low-fat dairy, and heart-healthy fats. With the DASH eating plan, you should limit salt (sodium) intake to 2,300   mg a day. If you have hypertension, you may need to reduce your sodium intake to 1,500 mg a day. Work with your health care provider or dietitian to adjust  your eating plan to your individual calorie needs. This information is not intended to replace advice given to you by your health care provider. Make sure you discuss any questions you have with your healthcare provider. Document Revised: 08/28/2019 Document Reviewed: 08/28/2019 Elsevier Patient Education  2022 Elsevier Inc.  

## 2021-05-19 ENCOUNTER — Other Ambulatory Visit: Payer: Self-pay

## 2021-05-23 ENCOUNTER — Ambulatory Visit: Payer: Self-pay | Admitting: Specialist

## 2021-05-24 ENCOUNTER — Telehealth: Payer: Self-pay

## 2021-05-24 NOTE — Telephone Encounter (Signed)
Social worker called patient to follow up with her concerning the resources provided to her previously and left a voicemail for patient.

## 2021-05-24 NOTE — BH Specialist Note (Signed)
Integrated Behavioral Health Follow Up In-Person Visit  MRN: [id number] Name: Barbie Croston   Total time: 60 minutes  Types of Service: Individual psychotherapy  Interpretor:No. Interpretor Name and Language: N/A  Subjective: Amy Knapp is a 65 y.o. female accompanied by  herself Patient was referred by Hurman Horn, NP for Mental Health. Patient reports the following symptoms/concerns: The patient reports that she has been having some hard times since her last follow-up session. Therma shared that she feels she is being discriminated against and targeted at work. She shred that she is often talked down to and disrespected by her coworkers and lower level management. She explained that she tries to do her best to use coping skills but her anger boils over pretty quickly and she will snap back at them, but they laugh at her or brush her off. She shared that she has very bad anxiety on her way to work and has experienced several panic attacks while on the job. Meriem discussed  her trauma history that began early in her childhood and perpetuated in various forms throughout her life. She shared she is feeling weary and tired. She explained that her niece recently came to visit and suggested that Chantele take sometime off work and go on a mini vacation. Charrie shared that her cousin in [place] told her something similar over the phone and she is thinking about getting a hotel room for a night to relax away from all the stress. The patient denied any suicidal or homicidal thoughts. Duration of problem: Years; Severity of problem: moderate  Objective: Mood: Euthymic and Affect: Appropriate Risk of harm to self or others: No plan to harm self or others  Life Context: Family and Social: see above School/Work: see above Self-Care: see above Life Changes: see above  Patient and/or Family's Strengths/Protective Factors: Concrete supports in place (healthy food, safe  environments, etc.)  Goals Addressed: Patient will:  Reduce symptoms of: agitation, anxiety, depression, insomnia, and stress   Increase knowledge and/or ability of: coping skills, healthy habits, self-management skills, and stress reduction   Demonstrate ability to: Increase healthy adjustment to current life circumstances  Progress towards Goals: Ongoing  Interventions: Interventions utilized:  CBT Cognitive Behavioral Therapy was utilized by the clinician during today's follow up session.The clinician processed with the patient how they have been doing since the last follow-up session.The clinician provided a space for the patient to ventilate their frustrations regarding their current life circumstances. Clinician measured the patient's anxiety and depression on a numerical scale. Clinician again provided information on self care and the importance of recharging to better be able to cope with the stressors in life. Clinician congratulated the patient on reaching out to her niece and cousin for support when she was feeling overwhelmed and encouraged her to continue to try to use her coping skills to deal with her current life circumstances. The clinician utilized Psycho-education to explain various trauma responses to the patient and relate trauma reactions to her current experiences. Standardized Assessments completed: GAD-7 and PHQ 9 GAD-7     15 PHQ-9     16  Assessment: Patient currently experiencing see above.   Patient may benefit from see above.  Plan: Follow up with behavioral health clinician on : 06/08/2021 at  Behavioral recommendations: Referral(s): [facility] [employer] (In Clinic) "From scale of 1-10, how likely are you to follow plan?":  Judith Part, LCSWA

## 2021-05-26 ENCOUNTER — Other Ambulatory Visit: Payer: Self-pay

## 2021-06-01 ENCOUNTER — Ambulatory Visit: Payer: Self-pay | Admitting: Licensed Clinical Social Worker

## 2021-06-01 ENCOUNTER — Other Ambulatory Visit: Payer: Self-pay

## 2021-06-01 DIAGNOSIS — F4310 Post-traumatic stress disorder, unspecified: Secondary | ICD-10-CM
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
## 2021-06-05 ENCOUNTER — Encounter: Payer: Self-pay | Admitting: Surgery

## 2021-06-05 ENCOUNTER — Ambulatory Visit (INDEPENDENT_AMBULATORY_CARE_PROVIDER_SITE_OTHER): Payer: Self-pay | Admitting: Surgery

## 2021-06-05 ENCOUNTER — Telehealth: Payer: Self-pay

## 2021-06-05 ENCOUNTER — Other Ambulatory Visit: Payer: Self-pay

## 2021-06-05 VITALS — BP 91/65 | HR 94 | Temp 98.2°F | Ht 65.0 in | Wt 165.2 lb

## 2021-06-05 DIAGNOSIS — K449 Diaphragmatic hernia without obstruction or gangrene: Secondary | ICD-10-CM

## 2021-06-05 DIAGNOSIS — K219 Gastro-esophageal reflux disease without esophagitis: Secondary | ICD-10-CM

## 2021-06-05 NOTE — Progress Notes (Unsigned)
  CT Abdomen / Pelvis scheduled 06/21/21 @ 2:45. Please pick up your prep kit 3 days prior to having the scan. Nothing to eat 4 hours prior.  Barium Swallow scheduled 06/29/21 @ 8:30 @ [place]. Nothing to eat 3 hours prior.

## 2021-06-05 NOTE — Patient Instructions (Addendum)
I'll schedule the CT scan and call you tomorrow.    Hiatal Hernia  A hiatal hernia occurs when part of the stomach slides above the muscle that separates the abdomen from the chest (diaphragm). A person can be born with a hiatal hernia (congenital), or it may develop over time. In almost all cases of hiatal hernia, only thetop part of the stomach pushes through the diaphragm. Many people have a hiatal hernia with no symptoms. The larger the hernia, the more likely it is that you will have symptoms. In some cases, a hiatal hernia allows stomach acid to flow back into the tube that carries food from your mouth to your stomach (esophagus). This may cause heartburn symptoms. Severe heartburn symptoms may mean thatyou have developed a condition called gastroesophageal reflux disease (GERD). What are the causes? This condition is caused by a weakness in the opening (hiatus) where the esophagus passes through the diaphragm to attach to the upper part of the stomach. A person may be born with a weakness in the hiatus, or aweakness can develop over time. What increases the risk? This condition is more likely to develop in: Older people. Age is a major risk factor for a hiatal hernia, especially if you are over the age of 31. Pregnant women. People who are overweight. People who have frequent constipation. What are the signs or symptoms? Symptoms of this condition usually develop in the form of GERD symptoms. Symptoms include: Heartburn. Belching. Indigestion. Trouble swallowing. Coughing or wheezing. Sore throat. Hoarseness. Chest pain. Nausea and vomiting. How is this diagnosed? This condition may be diagnosed during testing for GERD. Tests that may be done include: X-rays of your stomach or chest. An upper gastrointestinal (GI) series. This is an X-ray exam of your GI tract that is taken after you swallow a chalky liquid that shows up clearly on the X-ray. Endoscopy. This is a procedure to  look into your stomach using a thin, flexible tube that has a tiny camera and light on the end of it. How is this treated? This condition may be treated by: Dietary and lifestyle changes to help reduce GERD symptoms. Medicines. These may include: Over-the-counter antacids. Medicines that make your stomach empty more quickly. Medicines that block the production of stomach acid (H2 blockers). Stronger medicines to reduce stomach acid (proton pump inhibitors). Surgery to repair the hernia, if other treatments are not helping. If you have no symptoms, you may not need treatment. Follow these instructions at home: Lifestyle and activity Do not use any products that contain nicotine or tobacco, such as cigarettes and e-cigarettes. If you need help quitting, ask your health care provider. Try to achieve and maintain a healthy body weight. Avoid putting pressure on your abdomen. Anything that puts pressure on your abdomen increases the amount of acid that may be pushed up into your esophagus. Avoid bending over, especially after eating. Raise the head of your bed by putting blocks under the legs. This keeps your head and esophagus higher than your stomach. Do not wear tight clothing around your chest or stomach. Try not to strain when having a bowel movement, when urinating, or when lifting heavy objects. Eating and drinking Avoid foods that can worsen GERD symptoms. These may include: Fatty foods, like fried foods. Citrus fruits, like oranges or lemon. Other foods and drinks that contain acid, like orange juice or tomatoes. Spicy food. Chocolate. Eat frequent small meals instead of three large meals a day. This helps prevent your stomach from getting  too full. Eat slowly. Do not lie down right after eating. Do not eat 1-2 hours before bed. Do not drink beverages with caffeine. These include cola, coffee, cocoa, and tea. Do not drink alcohol. General instructions Take over-the-counter and  prescription medicines only as told by your health care provider. Keep all follow-up visits as told by your health care provider. This is important. Contact a health care provider if: Your symptoms are not controlled with medicines or lifestyle changes. You are having trouble swallowing. You have coughing or wheezing that will not go away. Get help right away if: Your pain is getting worse. Your pain spreads to your arms, neck, jaw, teeth, or back. You have shortness of breath. You sweat for no reason. You feel sick to your stomach (nauseous) or you vomit. You vomit blood. You have bright red blood in your stools. You have black, tarry stools. Summary A hiatal hernia occurs when part of the stomach slides above the muscle that separates the abdomen from the chest (diaphragm). A person may be born with a weakness in the hiatus, or a weakness can develop over time. Symptoms of hiatal hernia may include heartburn, trouble swallowing, or sore throat. Management of hiatal hernia includes eating frequent small meals instead of three large meals a day. Get help right away if you vomit blood, have bright red blood in your stools, or have black, tarry stools. This information is not intended to replace advice given to you by your health care provider. Make sure you discuss any questions you have with your healthcare provider. Document Revised: 08/25/2020 Document Reviewed: 08/25/2020 Elsevier Patient Education  2022 [employer].

## 2021-06-05 NOTE — Consult Note (Signed)
Patient ID: Amy Knapp, female   DOB: Jun 07, 1956, 65 y.o.   MRN: [id number]  HPI Amy Knapp is a 65 y.o. female seen in consultation at the request of Mrs. Iloabachie NP for hiatal hernia and reflux.  She reports that she has been having reflux for several months.  Worsening when she lays down and when she eats certain meals.  She did have a recent EGD as well as a colonoscopy and I have reviewed the images.  There is evidence of esophageal stricture that was dilated.  There are also biopsies taken.  No evidence of dysplasia.  Evidence of a small hiatal hernia.  She also had a CT scan of the chest from last year that have personally reviewed showing emphysema and small hiatal hernia.   she quit smoking last week.  She does have COPD and uses inhalers.  She does not wear any oxygen at this time. She did have a history of abdominal hysterectomy and tubal ligation in the past. She is diabetic but is currently well controlled. Last HbA1c 7.1 last CBC and BMP were normal.  SHe is fairly functional he denies any dyspnea on exertion or any angina.  Currently she denies any dysphagia.  She did report that before her esophageal dilation she used to have some mild dysphagia for solids.  Takes PPI w some relief  HPI  Past Medical History:  Diagnosis Date   Allergy    Anxiety    Arthritis    Asthma    Broken leg 2007   Right   Cataract    COPD (chronic obstructive pulmonary disease) (HCC)    Depression    Diabetes mellitus without complication (HCC)    Emphysema of lung (HCC)    Essential hypertension 04/20/2021   GERD (gastroesophageal reflux disease)    Gout    Hyperlipidemia    Sleep apnea    Thyroid disease     Past Surgical History:  Procedure Laterality Date   ABDOMINAL HYSTERECTOMY  2000   Partial   COLONOSCOPY WITH PROPOFOL N/A 04/11/2021   Procedure: COLONOSCOPY WITH PROPOFOL;  Surgeon: Midge Minium, MD;  Location: [facility] ENDOSCOPY;  Service: Endoscopy;   Laterality: N/A;   ESOPHAGOGASTRODUODENOSCOPY N/A 04/11/2021   Procedure: ESOPHAGOGASTRODUODENOSCOPY (EGD);  Surgeon: Midge Minium, MD;  Location: [facility] ENDOSCOPY;  Service: Endoscopy;  Laterality: N/A;   LEG SURGERY Right 10/08/2005   broken right leg   TUBAL LIGATION      Family History  Problem Relation Age of Onset   Cancer Mother    Heart attack Father    Heart attack Sister    Cancer Brother    Diabetes Daughter    Diabetes Son    Hypertension Maternal Aunt    Breast cancer Neg Hx     Social History Social History   Tobacco Use   Smoking status: Every Day    Packs/day: 0.15    Types: Cigarettes   Smokeless tobacco: Former   Tobacco comments:    Down to about 2 cigarettes a day  Vaping Use   Vaping Use: Never used  Substance Use Topics   Alcohol use: No   Drug use: No    Allergies  Allergen Reactions   Celery Oil     itching   Demerol [Meperidine]     Burning, feels hot   Morphine And Related Other (See Comments)    Muscle Spasms   Penicillins     blisters    Current Outpatient Medications  Medication  Sig Dispense Refill   albuterol (VENTOLIN HFA) 108 (90 Base) MCG/ACT inhaler INHALE 2 PUFFS INTO THE LUNGS EVERY 6 HOURS AS NEEDED FOR WHEEZING OR SHORT OF BREATH 54 g 2   allopurinol (ZYLOPRIM) 100 MG tablet TAKE ONE TABLET BY MOUTH EVERY DAY 90 tablet 1   atorvastatin (LIPITOR) 10 MG tablet TAKE ONE TABLET BY MOUTH EVERY DAY 30 tablet 2   Fluticasone-Umeclidin-Vilant 100-62.5-25 MCG/INH AEPB INHALE 1 PUFF INTO THE LUNGS EVERY DAY 60 each 3   gabapentin (NEURONTIN) 400 MG capsule Take 1 capsule (400 mg total) by mouth 3 (three) times daily. 270 capsule 0   glipiZIDE (GLUCOTROL) 5 MG tablet TAKE ONE TABLET BY MOUTH EVERY DAY 30 tablet 2   ibuprofen (ADVIL) 800 MG tablet TAKE ONE TABLET BY MOUTH 2 TIMES A DAY 28 tablet 0   levothyroxine (SYNTHROID) 100 MCG tablet TAKE ONE TABLET BY MOUTH 45 MINUTES BEFORE BREAKFAST. 60 tablet 3   losartan (COZAAR) 25 MG tablet  Take 1 tablet (25 mg total) by mouth once daily. 30 tablet 2   montelukast (SINGULAIR) 10 MG tablet TAKE ONE TABLET BY MOUTH AT BEDTIME 30 tablet 1   pseudoephedrine (SUDAFED) 30 MG tablet Take 1 tablet (30 mg total) by mouth every 6 (six) hours as needed for congestion. 24 tablet 0   traZODone (DESYREL) 50 MG tablet TAKE ONE TABLET BY MOUTH AT BEDTIME 30 tablet 1   fluticasone (FLONASE) 50 MCG/ACT nasal spray Place 2 sprays into both nostrils daily for 14 days. 9.9 mL 0   No current facility-administered medications for this visit.     Review of Systems Full ROS  was asked and was negative except for the information on the HPI  Physical Exam Blood pressure 91/65, pulse 94, temperature 98.2 F (36.8 C), temperature source Oral, height 5\' 5"  (1.651 m), weight 165 lb 3.2 oz (74.9 kg), SpO2 98 %. CONSTITUTIONAL: NAD EYES: Pupils are equal, round, a Sclera are non-icteric. EARS, NOSE, MOUTH AND THROAT: She is wearing a mask. Hearing is intact to voice. LYMPH NODES:  Lymph nodes in the neck are normal. RESPIRATORY:  Lungs are clear. There is normal respiratory effort, with equal breath sounds bilaterally, and without pathologic use of accessory muscles. CARDIOVASCULAR: Heart is regular without murmurs, gallops, or rubs. GI: The abdomen is soft,  and nondistended. Mild tenderness on epigastrium. There are no palpable masses. There is no hepatosplenomegaly. There are normal bowel sounds in all quadrants. GU: Rectal deferred.   MUSCULOSKELETAL: Normal muscle strength and tone. No cyanosis or edema.   SKIN: There is an epidermal inclusion cyst with some serum that is excision.  Measures 3 centimeters.  Is located in the lower thoracic spine midline. NEUROLOGIC: Motor and sensation is grossly normal. Cranial nerves are grossly intact. PSYCH:  Oriented to person, place and time. Affect is normal.  Data Reviewed  I have personally reviewed the patient's imaging, laboratory findings and medical  records.    Assessment/Plan 65 year old female with a sliding hiatal hernia that is causing significant reflux including an esophageal stricture.  She is interested in correction of hiatal hernia and antireflux procedure which I do think that it is indicated in this case.  We will start further work-up with a barium swallow and a CT scan of the abdomen pelvis.  She also had an epidermal inclusion cyst that is symptomatic in the back and she wishes to have it excised.  We will plan to perform excision under local anesthetic at the next follow-up  appointment.  Please note that I discussed with her in detail about her disease process and the rationale for pursuing further surgical intervention.  We briefly discussed the benefits of robotic surgery.  We discussed the operation briefly as well as expectation and postoperative course.  All her questions were answered.  A copy of this report was sent to the referring provider   Sterling Big, MD FACS General Surgeon 06/05/2021, 2:22 PM

## 2021-06-05 NOTE — Telephone Encounter (Signed)
Left message for patient to return call to office.    CT abdomen / Pelvis scheduled 06/21/21 [facility]. Please pick up prep kit 3 days prior.  Nothing to eat 4 hours.   Barrium swallow study scheduled @ [facility] 06/29/21 @ 8:30 am. Nothing to eat 4 hours prior.   Procedure-Excision of back cyst 07/03/21 @ 11am.

## 2021-06-06 ENCOUNTER — Telehealth: Payer: Self-pay

## 2021-06-06 NOTE — Telephone Encounter (Signed)
Left detailed message with the following appointment information.  CT scan scheduled 9/14 @ 3PM at [facility]-needs to pick up prep kit and instructions 2 days prior at  [place] 06/29/21 @ 8:30 am [facility]-enter through [facility] -check in at registration desk and they will show you to Radiology department- Procedure scheduled- Excision of cyst on back 07/03/21 @ 11 am here in the office. Will discuss all test results at this appointment.

## 2021-06-06 NOTE — Telephone Encounter (Signed)
Pt returned call from [facility], [place] & acknowledged understanding of the following:   - Scheduled CT @ [facility] (9/14 @ 3 pm) & to prep kit w/instructions to be picked up 2 days prior  - Barium Swallow @ [place] (9/22 @ 8:30 am); check in @ registration w/in the [place]  - Follow up visit w/Dr. Dahlia Byes on 9/26 @ 11 am for excision of cyst & review test results.  Thank you

## 2021-06-13 ENCOUNTER — Other Ambulatory Visit: Payer: Self-pay | Admitting: Gerontology

## 2021-06-13 ENCOUNTER — Other Ambulatory Visit: Payer: Self-pay

## 2021-06-13 DIAGNOSIS — G4700 Insomnia, unspecified: Secondary | ICD-10-CM

## 2021-06-13 MED ORDER — TRAZODONE HCL 50 MG PO TABS
150.0000 mg | ORAL_TABLET | Freq: Every day | ORAL | 0 refills | Status: DC
  Filled 2021-06-13: qty 90, 30d supply, fill #0

## 2021-06-15 ENCOUNTER — Other Ambulatory Visit: Payer: Self-pay

## 2021-06-15 ENCOUNTER — Ambulatory Visit: Payer: Self-pay | Admitting: Licensed Clinical Social Worker

## 2021-06-16 ENCOUNTER — Other Ambulatory Visit: Payer: Self-pay

## 2021-06-21 ENCOUNTER — Ambulatory Visit
Admission: RE | Admit: 2021-06-21 | Discharge: 2021-06-21 | Disposition: A | Discharge status: Home / Self Care | Payer: Self-pay | Source: Ambulatory Visit | Attending: Surgery | Admitting: Surgery

## 2021-06-21 ENCOUNTER — Other Ambulatory Visit: Payer: Self-pay

## 2021-06-21 DIAGNOSIS — K219 Gastro-esophageal reflux disease without esophagitis: Secondary | ICD-10-CM | POA: Insufficient documentation

## 2021-06-21 DIAGNOSIS — K449 Diaphragmatic hernia without obstruction or gangrene: Secondary | ICD-10-CM | POA: Insufficient documentation

## 2021-06-21 LAB — POCT I-STAT CREATININE: Creatinine, Ser: 0.7 mg/dL (ref 0.44–1.00)

## 2021-06-21 MED ORDER — IOHEXOL 350 MG/ML SOLN
100.0000 mL | Freq: Once | INTRAVENOUS | Status: AC | PRN
  Administered 2021-06-21: 100 mL via INTRAVENOUS

## 2021-06-22 ENCOUNTER — Ambulatory Visit: Payer: Self-pay | Admitting: Family Medicine

## 2021-06-22 ENCOUNTER — Telehealth: Payer: Self-pay

## 2021-06-22 VITALS — BP 139/78 | HR 73 | Temp 98.4°F | Ht 60.5 in | Wt 171.7 lb

## 2021-06-22 DIAGNOSIS — M25561 Pain in right knee: Secondary | ICD-10-CM

## 2021-06-22 DIAGNOSIS — E1140 Type 2 diabetes mellitus with diabetic neuropathy, unspecified: Secondary | ICD-10-CM

## 2021-06-22 DIAGNOSIS — I10 Essential (primary) hypertension: Secondary | ICD-10-CM

## 2021-06-22 DIAGNOSIS — K449 Diaphragmatic hernia without obstruction or gangrene: Secondary | ICD-10-CM

## 2021-06-22 DIAGNOSIS — N281 Cyst of kidney, acquired: Secondary | ICD-10-CM

## 2021-06-22 DIAGNOSIS — J449 Chronic obstructive pulmonary disease, unspecified: Secondary | ICD-10-CM

## 2021-06-22 DIAGNOSIS — Z7689 Persons encountering health services in other specified circumstances: Secondary | ICD-10-CM | POA: Insufficient documentation

## 2021-06-22 DIAGNOSIS — H539 Unspecified visual disturbance: Secondary | ICD-10-CM

## 2021-06-22 MED ORDER — DICLOFENAC SODIUM 1 % EX GEL: 2.0000 g | Freq: Four times a day (QID) | CUTANEOUS | Status: DC

## 2021-06-22 MED ORDER — OMEPRAZOLE 20 MG PO CPDR: 20.0000 mg | DELAYED_RELEASE_CAPSULE | Freq: Every day | ORAL | 0 refills | Status: DC

## 2021-06-22 NOTE — Assessment & Plan Note (Addendum)
Satting around 90-92 at rest Able to maintain >88 with activity when I was in room Needs to follow up with [facility]

## 2021-06-22 NOTE — Assessment & Plan Note (Signed)
2/2 stress, environmental issues with neighbors  Encouraged sleep hygiene, normal sleep schedule as able She's tired now at visit and c/o headache which I think is related to lack of sleep

## 2021-06-22 NOTE — Assessment & Plan Note (Addendum)
Follows with Dr. Everlene [facility] 7/22 with small hiatal hernia No definite hiatal hernia on CT yesterday Has pending barium swallow  Per gen surg Continue prilosec

## 2021-06-22 NOTE — Progress Notes (Signed)
Problem List Items Addressed This Visit       Cardiovascular and Mediastinum   Essential hypertension    Continue losartan         Respiratory   COPD (chronic obstructive pulmonary disease) (HCC)    Satting around 90-92 at rest Able to maintain >88 with activity when I was in room Needs to follow up with [place] [facility]         Endocrine   Diabetes (HCC)     Genitourinary   Complex renal cyst    CT scan with "minimally complex" renal cyst 1.8x3 cm Will need follow up -> discussed with radiology who recommended renal mass protocol MRI (will get this if possible as it's preferred study - if unable, consider follow up CT or renal US in 6 mo to 1 year)      Relevant Orders   MR Abdomen W Wo Contrast     Other   Right knee pain - Primary    Follow plain films  Needs orthopedic follow up  ddx involves OA, meniscus tear, etc  Use voltaren gel rather than oral NSAID given GI issues      Relevant Medications   diclofenac Sodium (VOLTAREN) 1 % topical gel 2 g   Other Relevant Orders   CHG X-RAY KNEE 3 VIEW   AMB referral to orthopedics   Hiatal hernia    Follows with Dr. Everlene Farrier Endoscopy 7/22 with small hiatal hernia No definite hiatal hernia on CT yesterday Has pending barium swallow  Per gen surg Continue prilosec      Sleep concern    2/2 stress, environmental issues with neighbors  Encouraged sleep hygiene, normal sleep schedule as able She's tired now at visit and c/o headache which I think is related to lack of sleep       Vision abnormalities    She requests ophtho referral       Relevant Orders   Ambulatory referral to Ophthalmology   Other Visit Diagnoses     Acquired cyst of kidney       Relevant Orders   MR Abdomen W Wo Contrast       Called to follow up labs/imaging plan - no answer   Established Patient Office Visit  Subjective:  Patient ID: Amy Knapp, female    DOB: [birth year]  Age: 65 y.o. MRN: [id number]  CC:  Chief  Complaint  Patient presents with   Follow-up    HPI Amy Knapp presents for   Fatigue: notes she's tired with work and issues with hiatal hernia  Works at [employer], she's up on her feet, on hard concrete.  Also, issues sleeping due to her neighbors.  She spent night in hotel because she was having such trouble sleeping.  She's noticed HA.  No vision changes, no new deficits (numbness, weakness).    Hiatal hernia following with Dr. Everlene Farrier.  Had CT scan yesterday, still has barium swallow.  She's taking prilosec.    R knee pain.  Worse since working at [employer].  She notes working on [employer].  Broke her leg years ago.  Notices locking, no clicking or popping.    She's not taking glipizide or metformin because of side effects (GI - diarrhea).    Past Medical History:  Diagnosis Date   Allergy    Anxiety    Arthritis    Asthma    Broken leg 2007   Right   Cataract    Complex renal cyst 06/24/2021  COPD (chronic obstructive pulmonary disease) (HCC)    Depression    Diabetes mellitus without complication (HCC)    Emphysema of lung (HCC)    Essential hypertension 04/20/2021   GERD (gastroesophageal reflux disease)    Gout    Hyperlipidemia    Sleep apnea    Thyroid disease     Past Surgical History:  Procedure Laterality Date   ABDOMINAL HYSTERECTOMY  2000   Partial   COLONOSCOPY WITH PROPOFOL N/Angelise Petrich 04/11/2021   Procedure: COLONOSCOPY WITH PROPOFOL;  Surgeon: Midge Minium, MD;  Location: [facility] ENDOSCOPY;  Service: Endoscopy;  Laterality: N/Cosmo Tetreault;   ESOPHAGOGASTRODUODENOSCOPY N/Lolly Glaus 04/11/2021   Procedure: ESOPHAGOGASTRODUODENOSCOPY (EGD);  Surgeon: Midge Minium, MD;  Location: [facility] ENDOSCOPY;  Service: Endoscopy;  Laterality: N/Airika Alkhatib;   LEG SURGERY Right 10/08/2005   broken right leg   TUBAL LIGATION      Family History  Problem Relation Age of Onset   Cancer Mother    Heart attack Father    Heart attack Sister    Cancer Brother    Diabetes Daughter    Diabetes Son     Hypertension Maternal Aunt    Breast cancer Neg Hx     Social History   Socioeconomic History   Marital status: Divorced    Spouse name: Not on file   Number of children: 4   Years of education: Not on file   Highest education level: High school graduate  Occupational History   Occupation: Unemployed  Tobacco Use   Smoking status: Every Day    Packs/day: 0.15    Types: Cigarettes   Smokeless tobacco: Former   Tobacco comments:    Down to about 2 cigarettes Tylyn Stankovich day  Vaping Use   Vaping Use: Never used  Substance and Sexual Activity   Alcohol use: No   Drug use: No   Sexual activity: Not Currently  Other Topics Concern   Not on file  Social History Narrative   Not on file   Social Determinants of Health   Financial Resource Strain: Not on file  Food Insecurity: Not on file  Transportation Needs: Not on file  Physical Activity: Not on file  Stress: Not on file  Social Connections: Not on file  Intimate Partner Violence: Not on file    Outpatient Medications Prior to Visit  Medication Sig Dispense Refill   albuterol (VENTOLIN HFA) 108 (90 Base) MCG/ACT inhaler INHALE 2 PUFFS INTO THE LUNGS EVERY 6 HOURS AS NEEDED FOR WHEEZING OR SHORT OF BREATH 54 g 2   allopurinol (ZYLOPRIM) 100 MG tablet TAKE ONE TABLET BY MOUTH EVERY DAY 90 tablet 1   atorvastatin (LIPITOR) 10 MG tablet TAKE ONE TABLET BY MOUTH EVERY DAY 30 tablet 2   fluticasone (FLONASE) 50 MCG/ACT nasal spray Place 2 sprays into both nostrils daily for 14 days. 9.9 mL 0   Fluticasone-Umeclidin-Vilant 100-62.5-25 MCG/INH AEPB INHALE 1 PUFF INTO THE LUNGS EVERY DAY 60 each 3   gabapentin (NEURONTIN) 400 MG capsule Take 1 capsule (400 mg total) by mouth 3 (three) times daily. 270 capsule 0   levothyroxine (SYNTHROID) 100 MCG tablet TAKE ONE TABLET BY MOUTH 45 MINUTES BEFORE BREAKFAST. 60 tablet 3   losartan (COZAAR) 25 MG tablet Take 1 tablet (25 mg total) by mouth once daily. 30 tablet 2   montelukast (SINGULAIR) 10  MG tablet TAKE ONE TABLET BY MOUTH AT BEDTIME 30 tablet 1   pseudoephedrine (SUDAFED) 30 MG tablet Take 1 tablet (30 mg total) by mouth every 6 (  six) hours as needed for congestion. 24 tablet 0   traZODone (DESYREL) 50 MG tablet Take 3 tablets (150 mg total) by mouth once daily at bedtime. 90 tablet 0   glipiZIDE (GLUCOTROL) 5 MG tablet TAKE ONE TABLET BY MOUTH EVERY DAY 30 tablet 2   ibuprofen (ADVIL) 800 MG tablet TAKE ONE TABLET BY MOUTH 2 TIMES Evoleht Hovatter DAY (Patient not taking: Reported on 06/22/2021) 28 tablet 0   No facility-administered medications prior to visit.    Allergies  Allergen Reactions   Celery Oil     itching   Demerol [Meperidine]     Burning, feels hot   Morphine And Related Other (See Comments)    Muscle Spasms   Penicillins     blisters    ROS Review of Systems    Objective:    Physical Exam Constitutional:      Appearance: Normal appearance.     Comments: Looks tired, sleepy  HENT:     Head: Normocephalic and atraumatic.  Cardiovascular:     Rate and Rhythm: Normal rate and regular rhythm.  Pulmonary:     Effort: Pulmonary effort is normal.     Breath sounds: Normal breath sounds. No wheezing.  Musculoskeletal:     Cervical back: Normal range of motion.     Comments: Pain with range of motion of right lower extremity.   TTP to joint line  Negative anterior and posterior drawer   Skin:    General: Skin is warm and dry.  Neurological:     General: No focal deficit present.     Mental Status: She is alert.  Psychiatric:        Mood and Affect: Mood normal.   BP 139/78 (BP Location: Left Arm)   Pulse 73   Temp 98.4 F (36.9 C)   Ht 5' 0.5" (1.537 m)   Wt 171 lb 11.2 oz (77.9 kg)   SpO2 90%   BMI 32.98 kg/m  Wt Readings from Last 3 Encounters:  06/22/21 171 lb 11.2 oz (77.9 kg)  06/05/21 165 lb 3.2 oz (74.9 kg)  05/18/21 163 lb (73.9 kg)     Health Maintenance Due  Topic Date Due   COVID-19 Vaccine (1) Never done   OPHTHALMOLOGY EXAM   Never done   HIV Screening  Never done   Hepatitis C Screening  Never done   TETANUS/TDAP  Never done   Zoster Vaccines- Shingrix (1 of 2) Never done   PAP SMEAR-Modifier  Never done   MAMMOGRAM  12/14/2020   INFLUENZA VACCINE  05/08/2021    There are no preventive care reminders to display for this patient.  Lab Results  Component Value Date   TSH 1.870 03/16/2021   Lab Results  Component Value Date   WBC 8.2 11/21/2020   HGB 14.6 11/21/2020   HCT 43.7 11/21/2020   MCV 90.5 11/21/2020   PLT 276 11/21/2020   Lab Results  Component Value Date   NA 137 11/21/2020   K 3.7 11/21/2020   CO2 25 11/21/2020   GLUCOSE 258 (H) 11/21/2020   BUN 11 11/21/2020   CREATININE 0.70 06/21/2021   BILITOT 0.3 07/28/2020   ALKPHOS 108 07/28/2020   AST 28 07/28/2020   ALT 37 (H) 07/28/2020   PROT 6.8 07/28/2020   ALBUMIN 4.4 07/28/2020   CALCIUM 9.3 11/21/2020   ANIONGAP 11 11/21/2020   Lab Results  Component Value Date   CHOL 179 07/28/2020   Lab Results  Component Value Date  HDL 46 07/28/2020   Lab Results  Component Value Date   LDLCALC 99 07/28/2020   Lab Results  Component Value Date   TRIG 199 (H) 07/28/2020   Lab Results  Component Value Date   CHOLHDL 4.0 03/16/2020   Lab Results  Component Value Date   HGBA1C 7.2 (H) 06/22/2021      Assessment & Plan:   Problem List Items Addressed This Visit       Cardiovascular and Mediastinum   Essential hypertension    Continue losartan         Respiratory   COPD (chronic obstructive pulmonary disease) (HCC)    Satting around 90-92 at rest Able to maintain >88 with activity when I was in room Needs to follow up with [place] pul[facility]         Endocrine   Diabetes (HCC)     Genitourinary   Complex renal cyst    CT scan with "minimally complex" renal cyst 1.8x3 cm Will need follow up -> discussed with radiology who recommended renal mass protocol MRI (will get this if possible as it's preferred study - if  unable, consider follow up CT or renal US in 6 mo to 1 year)      Relevant Orders   MR Abdomen W Wo Contrast     Other   Right knee pain - Primary    Follow plain films  Needs orthopedic follow up  ddx involves OA, meniscus tear, etc  Use voltaren gel rather than oral NSAID given GI issues      Relevant Medications   diclofenac Sodium (VOLTAREN) 1 % topical gel 2 g   Other Relevant Orders   CHG X-RAY KNEE 3 VIEW   AMB referral to orthopedics   Hiatal hernia    Follows with Dr. Everlene Farrier Endoscopy 7/22 with small hiatal hernia No definite hiatal hernia on CT yesterday Has pending barium swallow  Per gen surg Continue prilosec      Sleep concern    2/2 stress, environmental issues with neighbors  Encouraged sleep hygiene, normal sleep schedule as able She's tired now at visit and c/o headache which I think is related to lack of sleep       Vision abnormalities    She requests ophtho referral       Relevant Orders   Ambulatory referral to Ophthalmology   Other Visit Diagnoses     Acquired cyst of kidney       Relevant Orders   MR Abdomen W Wo Contrast       Meds ordered this encounter  Medications   omeprazole (PRILOSEC) 20 MG capsule    Sig: Take 1 capsule (20 mg total) by mouth daily.    Dispense:  30 capsule    Refill:  0   diclofenac Sodium (VOLTAREN) 1 % topical gel 2 g    Follow-up: Return in about 4 weeks (around 07/20/2021).    Lacretia Nicks, MD

## 2021-06-22 NOTE — Telephone Encounter (Signed)
Left message stating CT looks good no surprises per Dr.Pabon -reminded patient of procedure appointment 07/03/21.

## 2021-06-22 NOTE — Patient Instructions (Addendum)
I will see if I can get you into orthopedics for your knee.  We'll start with x ray.  Try voltaren for your knee.    Check your blood sugars.  I'd like to check an A1c again for you (either today or in the next few weeks).   Continue your prilosec (omeprazole) for reflux related to the hernia.  Follow with Dr. Everlene Farrier for next steps for the hernia.    Try to prioritize Amy Knapp sleep routine (I know with your neighbors it's hard).  I think this may help your headaches and daily fatigue.    Follow up with [place] [facility].  [phone]

## 2021-06-22 NOTE — Assessment & Plan Note (Addendum)
Follow plain films  Needs orthopedic follow up  ddx involves OA, meniscus tear, etc  Use voltaren gel rather than oral NSAID given GI issues

## 2021-06-22 NOTE — Assessment & Plan Note (Signed)
Continue losartan. 

## 2021-06-22 NOTE — Assessment & Plan Note (Signed)
She notes she's stopped all her meds (metformin and glipizide)  Follow A1c

## 2021-06-23 DIAGNOSIS — H539 Unspecified visual disturbance: Secondary | ICD-10-CM | POA: Insufficient documentation

## 2021-06-23 LAB — HEMOGLOBIN A1C
Est. average glucose Bld gHb Est-mCnc: 160 mg/dL
Hgb A1c MFr Bld: 7.2 % — ABNORMAL HIGH (ref 4.8–5.6)

## 2021-06-23 NOTE — Assessment & Plan Note (Signed)
She requests ophtho referral

## 2021-06-24 DIAGNOSIS — N281 Cyst of kidney, acquired: Secondary | ICD-10-CM

## 2021-06-24 HISTORY — DX: Cyst of kidney, acquired: N28.1

## 2021-06-24 NOTE — Assessment & Plan Note (Addendum)
CT scan with "minimally complex" renal cyst 1.8x3 cm Will need follow up -> discussed with radiology who recommended renal mass protocol MRI (will get this if possible as it's preferred study - if unable, consider follow up CT or renal US in 6 mo to 1 year)

## 2021-06-29 ENCOUNTER — Ambulatory Visit
Admission: RE | Admit: 2021-06-29 | Discharge: 2021-06-29 | Disposition: A | Discharge status: Home / Self Care | Payer: Self-pay | Source: Ambulatory Visit | Attending: Surgery | Admitting: Surgery

## 2021-06-29 ENCOUNTER — Other Ambulatory Visit: Payer: Self-pay | Admitting: Surgery

## 2021-06-29 ENCOUNTER — Other Ambulatory Visit: Payer: Self-pay

## 2021-06-29 DIAGNOSIS — K449 Diaphragmatic hernia without obstruction or gangrene: Secondary | ICD-10-CM

## 2021-06-29 DIAGNOSIS — K219 Gastro-esophageal reflux disease without esophagitis: Secondary | ICD-10-CM

## 2021-06-30 ENCOUNTER — Telehealth: Payer: Self-pay | Admitting: Surgery

## 2021-06-30 NOTE — Telephone Encounter (Signed)
Patient called and is asking if her results are in from her Barium Swallow. Please call patient and advise.

## 2021-06-30 NOTE — Telephone Encounter (Signed)
Barium swallow results good per DR.Pabon-reflux no surprises- keep scheduled appointment 07/03/21 for Procedure.

## 2021-06-30 NOTE — Telephone Encounter (Signed)
Left message letting patient know we would call her with Barium swallow results once Dr.Pabon has a chance to review.

## 2021-07-03 ENCOUNTER — Other Ambulatory Visit: Payer: Self-pay

## 2021-07-03 ENCOUNTER — Other Ambulatory Visit: Payer: Self-pay | Admitting: Surgery

## 2021-07-03 ENCOUNTER — Encounter: Payer: Self-pay | Admitting: Surgery

## 2021-07-03 ENCOUNTER — Ambulatory Visit (INDEPENDENT_AMBULATORY_CARE_PROVIDER_SITE_OTHER): Payer: Self-pay | Admitting: Surgery

## 2021-07-03 VITALS — BP 158/82 | HR 109 | Temp 98.4°F | Ht 60.05 in | Wt 172.0 lb

## 2021-07-03 DIAGNOSIS — L729 Follicular cyst of the skin and subcutaneous tissue, unspecified: Secondary | ICD-10-CM

## 2021-07-03 MED ORDER — IBUPROFEN 600 MG PO TABS
600.0000 mg | ORAL_TABLET | Freq: Four times a day (QID) | ORAL | 0 refills | Status: DC | PRN
  Filled 2021-07-03: qty 30, 8d supply, fill #0

## 2021-07-03 NOTE — Patient Instructions (Addendum)
Follow up here in 2 weeks.   We have removed a Cyst in our office today.  You have sutures under the skin that will dissolve and also dermabond (skin glue) on top of your skin which will come off on it's own in 10-14 days.  You may shower Wednesday, do not scrub at the area. Do not submerge until the area is fully healed.   You may use Tylenol or Ibuprofen as needed for discomfort.   Avoid Strenuous activities that will make you sweat during the next 48 hours to avoid the glue coming off prematurely. Avoid activities that will place pressure to this area of the body for 1-2 weeks to avoid re-injury to incision site.  Please see your follow-up appointment provided. We will see you back in office to make sure this area is healed and to review the final pathology. If you have any questions or concerns prior to this appointment, call our office and speak with a nurse.    Excision of Skin Cysts or Lesions Excision of a skin lesion refers to the removal of a section of skin by making small cuts (incisions) in the skin. This procedure may be done to remove a cancerous (malignant) or noncancerous (benign) growth on the skin. It is typically done to treat or prevent cancer or infection. It may also be done to improve cosmetic appearance. The procedure may be done to remove: Cancerous growths, such as basal cell carcinoma, squamous cell carcinoma, or melanoma. Noncancerous growths, such as a cyst or lipoma. Growths, such as moles or skin tags, which may be removed for cosmetic reasons.  Various excision or surgical techniques may be used depending on your condition, the location of the lesion, and your overall health. Tell a health care provider about: Any allergies you have. All medicines you are taking, including vitamins, herbs, eye drops, creams, and over-the-counter medicines. Any problems you or family members have had with anesthetic medicines. Any blood disorders you have. Any surgeries  you have had. Any medical conditions you have. Whether you are pregnant or may be pregnant. What are the risks? Generally, this is a safe procedure. However, problems may occur, including: Bleeding. Infection. Scarring. Recurrence of the cyst, lipoma, or cancer. Changes in skin sensation or appearance, such as discoloration or swelling. Reaction to the anesthetics. Allergic reaction to surgical materials or ointments. Damage to nerves, blood vessels, muscles, or other structures. Continued pain.  What happens before the procedure? Ask your health care provider about: Changing or stopping your regular medicines. This is especially important if you are taking diabetes medicines or blood thinners. Taking medicines such as aspirin and ibuprofen. These medicines can thin your blood. Do not take these medicines before your procedure if your health care provider instructs you not to. You may be asked to take certain medicines. You may be asked to stop smoking. You may have an exam or testing. Plan to have someone take you home after the procedure. Plan to have someone help you with activities during recovery. What happens during the procedure? To reduce your risk of infection: Your health care team will wash or sanitize their hands. Your skin will be washed with soap. You will be given a medicine to numb the area (local anesthetic). One of the following excision techniques will be performed. At the end of any of these procedures, antibiotic ointment will be applied as needed. Each of the following techniques may vary among health care providers and hospitals. Complete Surgical Excision The  area of skin that needs to be removed will be marked with a pen. Using a small scalpel or scissors, the surgeon will gently cut around and under the lesion until it is completely removed. The lesion will be placed in a fluid and sent to the lab for examination. If necessary, bleeding will be controlled  with a device that delivers heat (electrocautery). The edges of the wound may be stitched (sutured) together, and a bandage (dressing) will be applied. This procedure may be performed to treat a cancerous growth or a noncancerous cyst or lesion. Excision of a Cyst The surgeon will make an incision on the cyst. The entire cyst will be removed through the incision. The incision may be closed with sutures.  What happens after the procedure? Return to your normal activities as told by your health care provider. Talk with your health care provider to discuss any test results, treatment options, and if necessary, the need for more tests. This information is not intended to replace advice given to you by your health care provider. Make sure you discuss any questions you have with your health care provider. Document Released: 12/19/2009 Document Revised: 03/01/2016 Document Reviewed: 11/10/2014 Elsevier Interactive Patient Education  [employer].

## 2021-07-04 ENCOUNTER — Ambulatory Visit: Payer: Self-pay | Admitting: Licensed Clinical Social Worker

## 2021-07-04 DIAGNOSIS — G4700 Insomnia, unspecified: Secondary | ICD-10-CM

## 2021-07-04 DIAGNOSIS — F4310 Post-traumatic stress disorder, unspecified: Secondary | ICD-10-CM

## 2021-07-11 ENCOUNTER — Encounter: Payer: Self-pay | Admitting: Surgery

## 2021-07-11 ENCOUNTER — Other Ambulatory Visit: Payer: Self-pay

## 2021-07-11 NOTE — Progress Notes (Signed)
Procedure Note  Excision 12 mm EIC back Intermediate closure of 22mm wound on the back  Anesthesia: Lidocaine 1% with epinephrine  EBL: Minimal  After informed consent the patient was placed in a prone position and she was prepped and draped in the standard fashion.  Local anesthetic was infiltrated and lesion was identified.  Using elliptical incision were able to obtain a good margin of resection.  The lesion and skin were removed using Metzenbaum scissors.  Pressure was applied to obtain hemostasis.  The wound was closed in a 2 layer fashion with interrupted dermal 3-0 Vicryl and a subcuticular 4 Monocryl suture.  Dermabond was applied no complications

## 2021-07-17 ENCOUNTER — Telehealth: Payer: Self-pay | Admitting: Pharmacist

## 2021-07-17 NOTE — Telephone Encounter (Signed)
--   Rhetta Mura - Monday, July 17, 2021 3:31 PM --Jeanene Erb [employer] spoke with Juliet-according to her patient enrollment ended Feb 08, 2021--therefore the refill I placed 03/03/21 was cancelled. I have printed [employer] renewal-put script in folder for Lanora Manis to sign, also mailing patient her portion of application to sign & return, requesting current poi.

## 2021-07-18 ENCOUNTER — Telehealth: Payer: Self-pay | Admitting: Pharmacist

## 2021-07-18 ENCOUNTER — Telehealth: Payer: Self-pay | Admitting: Licensed Clinical Social Worker

## 2021-07-18 NOTE — Telephone Encounter (Signed)
Called to make referral for resources. -BG

## 2021-07-18 NOTE — Telephone Encounter (Signed)
--   Amy Knapp - Tuesday, July 18, 2021 1:44 PM --I have received the signed script for Trelegy Ellipta from provider, holding for patient to return their portion--mailed to patient 07/17/21.

## 2021-07-19 ENCOUNTER — Other Ambulatory Visit: Payer: Self-pay | Admitting: Gerontology

## 2021-07-19 ENCOUNTER — Other Ambulatory Visit: Payer: Self-pay

## 2021-07-19 DIAGNOSIS — G4700 Insomnia, unspecified: Secondary | ICD-10-CM

## 2021-07-20 ENCOUNTER — Other Ambulatory Visit: Payer: Self-pay

## 2021-07-20 ENCOUNTER — Ambulatory Visit: Payer: Self-pay | Admitting: Gerontology

## 2021-07-20 VITALS — BP 155/83 | HR 77 | Temp 98.0°F | Resp 16 | Wt 173.3 lb

## 2021-07-20 DIAGNOSIS — I10 Essential (primary) hypertension: Secondary | ICD-10-CM

## 2021-07-20 DIAGNOSIS — E1140 Type 2 diabetes mellitus with diabetic neuropathy, unspecified: Secondary | ICD-10-CM

## 2021-07-20 DIAGNOSIS — N281 Cyst of kidney, acquired: Secondary | ICD-10-CM

## 2021-07-20 DIAGNOSIS — K219 Gastro-esophageal reflux disease without esophagitis: Secondary | ICD-10-CM

## 2021-07-20 LAB — GLUCOSE, POCT (MANUAL RESULT ENTRY): POC Glucose: 85 mg/dl (ref 70–99)

## 2021-07-20 MED ORDER — OMEPRAZOLE 20 MG PO CPDR: 20.0000 mg | DELAYED_RELEASE_CAPSULE | Freq: Every day | ORAL | 0 refills | Status: DC

## 2021-07-20 MED ORDER — BLOOD GLUCOSE MONITOR KIT
PACK | 0 refills | Status: DC
  Filled 2021-07-20: qty 1, 1d supply, fill #0

## 2021-07-20 MED FILL — Trazodone HCl Tab 50 MG: ORAL | 30 days supply | Qty: 90 | Fill #0 | Status: AC

## 2021-07-20 NOTE — Progress Notes (Signed)
Established Patient Office Visit  Subjective:  Patient ID: Amy Knapp, female    DOB: [birth year]  Age: 65 y.o. MRN: [id number]  CC: No chief complaint on file.   HPI Amy Knapp is a 65 y/o female who has history of Allergy, Anxiety, Arthritis, Asthma, COPD, Depression, Diabetes, presents for lab review and routine follow up visit. She states that she's compliant with her medications and continues to make healthy lifestyle changes. She states that she lost her glucometer and have not being checking her blood glucose. Her HgbA1c done on 06/22/21 was 7.2% and her blood glucose was 85 mg/dl during visit. She denies hypoglycemia, and states that her peripheral neuropathy is under control with taking gabapentin. She had a 12 mm cyst removed from her back on 07/03/21 by Dr Dahlia Byes, and site has completely healed. Currently, she states that she regurgitates her food and will follow up with [facility] Dr Dahlia Byes with regards to her hiatal hernia on 07/31/21. An 8 cm right simple renal cyst is noted. Another 1.8 x 3 cm right renal cyst is noted to have a thin septation and likely represents a minimally complex renal cyst. She requests a referral to evaluate the cyst. She denies flank pain, fever, chills, dysuria, urinary . Overall, she states that she's doing well and offers no further complaint.    Past Medical History:  Diagnosis Date   Allergy    Anxiety    Arthritis    Asthma    Broken leg 2007   Right   Cataract    Complex renal cyst 06/24/2021   COPD (chronic obstructive pulmonary disease) (HCC)    Depression    Diabetes mellitus without complication (HCC)    Emphysema of lung ([place])    Essential hypertension 04/20/2021   GERD (gastroesophageal reflux disease)    Gout    Hyperlipidemia    Sleep apnea    Thyroid disease     Past Surgical History:  Procedure Laterality Date   ABDOMINAL HYSTERECTOMY  2000   Partial   COLONOSCOPY WITH PROPOFOL N/A 04/11/2021    Procedure: COLONOSCOPY WITH PROPOFOL;  Surgeon: Lucilla Lame, MD;  Location: [facility] ENDOSCOPY;  Service: Endoscopy;  Laterality: N/A;   ESOPHAGOGASTRODUODENOSCOPY N/A 04/11/2021   Procedure: ESOPHAGOGASTRODUODENOSCOPY (EGD);  Surgeon: Lucilla Lame, MD;  Location: [facility] ENDOSCOPY;  Service: Endoscopy;  Laterality: N/A;   LEG SURGERY Right 10/08/2005   broken right leg   TUBAL LIGATION      Family History  Problem Relation Age of Onset   Cancer Mother    Heart attack Father    Heart attack Sister    Cancer Brother    Diabetes Daughter    Diabetes Son    Hypertension Maternal Aunt    Breast cancer Neg Hx     Social History   Socioeconomic History   Marital status: Divorced    Spouse name: Not on file   Number of children: 4   Years of education: Not on file   Highest education level: High school graduate  Occupational History   Occupation: Unemployed  Tobacco Use   Smoking status: Every Day    Packs/day: 0.15    Types: Cigarettes   Smokeless tobacco: Former    Quit date: 06/06/2021  Vaping Use   Vaping Use: Never used  Substance and Sexual Activity   Alcohol use: No   Drug use: No   Sexual activity: Not Currently  Other Topics Concern   Not on file  Social History  Narrative   Not on file   Social Determinants of Health   Financial Resource Strain: Low Risk    Difficulty of Paying Living Expenses: Not hard at all  Food Insecurity: No Food Insecurity   Worried About Charity fundraiser in the Last Year: Never true   [place] in the Last Year: Never true  Transportation Needs: No Transportation Needs   Lack of Transportation (Medical): No   Lack of Transportation (Non-Medical): No  Physical Activity: Sufficiently Active   Days of Exercise per Week: 7 days   Minutes of Exercise per Session: 150+ min  Stress: Stress Concern Present   Feeling of Stress : Rather much  Social Connections: Moderately Isolated   Frequency of Communication with Friends and Family:  More than three times a week   Frequency of Social Gatherings with Friends and Family: Once a week   Attends Religious Services: More than 4 times per year   Active Member of [employer] or Organizations: No   Attends Archivist Meetings: Never   Marital Status: Widowed  Human resources officer Violence: Not At Risk   Fear of Current or Ex-Partner: No   Emotionally Abused: No   Physically Abused: No   Sexually Abused: No    Outpatient Medications Prior to Visit  Medication Sig Dispense Refill   albuterol (VENTOLIN HFA) 108 (90 Base) MCG/ACT inhaler INHALE 2 PUFFS INTO THE LUNGS EVERY 6 HOURS AS NEEDED FOR WHEEZING OR SHORT OF BREATH 54 g 2   allopurinol (ZYLOPRIM) 100 MG tablet TAKE ONE TABLET BY MOUTH EVERY DAY 90 tablet 1   atorvastatin (LIPITOR) 10 MG tablet TAKE ONE TABLET BY MOUTH EVERY DAY 30 tablet 2   Fluticasone-Umeclidin-Vilant 100-62.5-25 MCG/INH AEPB INHALE 1 PUFF INTO THE LUNGS EVERY DAY 60 each 3   gabapentin (NEURONTIN) 400 MG capsule Take 1 capsule (400 mg total) by mouth 3 (three) times daily. 270 capsule 0   ibuprofen (ADVIL) 600 MG tablet Take 1 tablet (600 mg total) by mouth every 6 (six) hours as needed. 30 tablet 0   losartan (COZAAR) 25 MG tablet Take 1 tablet (25 mg total) by mouth once daily. 30 tablet 2   montelukast (SINGULAIR) 10 MG tablet TAKE ONE TABLET BY MOUTH AT BEDTIME 30 tablet 1   omeprazole (PRILOSEC) 20 MG capsule Take 1 capsule (20 mg total) by mouth daily. 30 capsule 0   traZODone (DESYREL) 50 MG tablet Take 3 tablets (150 mg total) by mouth once daily at bedtime. 90 tablet 0   fluticasone (FLONASE) 50 MCG/ACT nasal spray Place 2 sprays into both nostrils daily for 14 days. 9.9 mL 0   levothyroxine (SYNTHROID) 100 MCG tablet TAKE ONE TABLET BY MOUTH 45 MINUTES BEFORE BREAKFAST. 60 tablet 3   Facility-Administered Medications Prior to Visit  Medication Dose Route Frequency Provider Last Rate Last Admin   diclofenac Sodium (VOLTAREN) 1 % topical gel 2  g  2 g Topical QID Elodia Florence., MD        Allergies  Allergen Reactions   Celery Oil     itching   Demerol [Meperidine]     Burning, feels hot   Morphine And Related Other (See Comments)    Muscle Spasms   Penicillins     blisters    ROS Review of Systems  Constitutional: Negative.   Respiratory: Negative.    Cardiovascular: Negative.   Gastrointestinal: Negative.   Endocrine: Negative.   Neurological: Negative.   Psychiatric/Behavioral: Negative.  Objective:    Physical Exam HENT:     Head: Normocephalic and atraumatic.     Mouth/Throat:     Mouth: Mucous membranes are moist.  Eyes:     Extraocular Movements: Extraocular movements intact.     Conjunctiva/sclera: Conjunctivae normal.     Pupils: Pupils are equal, round, and reactive to light.  Cardiovascular:     Rate and Rhythm: Normal rate and regular rhythm.     Pulses: Normal pulses.     Heart sounds: Normal heart sounds.  Pulmonary:     Effort: Pulmonary effort is normal.     Breath sounds: Normal breath sounds.  Abdominal:     Tenderness: There is no right CVA tenderness or left CVA tenderness.  Neurological:     General: No focal deficit present.     Mental Status: She is alert and oriented to person, place, and time. Mental status is at baseline.  Psychiatric:        Mood and Affect: Mood normal.        Behavior: Behavior normal.        Thought Content: Thought content normal.        Judgment: Judgment normal.    BP (!) 155/83 (BP Location: Left Arm, Patient Position: Sitting, Cuff Size: Large)   Pulse 77   Temp 98 F (36.7 C)   Resp 16   Wt 173 lb 4.8 oz (78.6 kg)   BMI 33.79 kg/m  Wt Readings from Last 3 Encounters:  07/20/21 173 lb 4.8 oz (78.6 kg)  07/03/21 172 lb (78 kg)  06/22/21 171 lb 11.2 oz (77.9 kg)   Weight loss encouraged  Health Maintenance Due  Topic Date Due   COVID-19 Vaccine (1) Never done   OPHTHALMOLOGY EXAM  Never done   HIV Screening  Never done    Hepatitis C Screening  Never done   TETANUS/TDAP  Never done   Zoster Vaccines- Shingrix (1 of 2) Never done   PAP SMEAR-Modifier  Never done   MAMMOGRAM  12/14/2020   INFLUENZA VACCINE  05/08/2021    There are no preventive care reminders to display for this patient.  Lab Results  Component Value Date   TSH 1.870 03/16/2021   Lab Results  Component Value Date   WBC 8.2 11/21/2020   HGB 14.6 11/21/2020   HCT 43.7 11/21/2020   MCV 90.5 11/21/2020   PLT 276 11/21/2020   Lab Results  Component Value Date   NA 137 11/21/2020   K 3.7 11/21/2020   CO2 25 11/21/2020   GLUCOSE 258 (H) 11/21/2020   BUN 11 11/21/2020   CREATININE 0.70 06/21/2021   BILITOT 0.3 07/28/2020   ALKPHOS 108 07/28/2020   AST 28 07/28/2020   ALT 37 (H) 07/28/2020   PROT 6.8 07/28/2020   ALBUMIN 4.4 07/28/2020   CALCIUM 9.3 11/21/2020   ANIONGAP 11 11/21/2020   Lab Results  Component Value Date   CHOL 179 07/28/2020   Lab Results  Component Value Date   HDL 46 07/28/2020   Lab Results  Component Value Date   LDLCALC 99 07/28/2020   Lab Results  Component Value Date   TRIG 199 (H) 07/28/2020   Lab Results  Component Value Date   CHOLHDL 4.0 03/16/2020   Lab Results  Component Value Date   HGBA1C 7.2 (H) 06/22/2021      Assessment & Plan:     1. Essential hypertension -Her blood pressure is not under control, she was encouraged  to continue on current medication, DASH diet and exercise as tolerated.  2. Complex renal cyst - She will follow up with Nephrology for renal cyst evaluation - Ambulatory referral to Nephrology  3. Gastroesophageal reflux disease, unspecified whether esophagitis present -She reports regurgitating her food, will continue on current medication and follow up with General Surgery. - omeprazole (PRILOSEC) 20 MG capsule; Take 1 capsule (20 mg total) by mouth daily.  Dispense: 30 capsule; Refill: 0  4. Type 2 diabetes mellitus with diabetic neuropathy,  without long-term current use of insulin ([place]) - She will pick up new glucometer from [facility], continue on current medication, low carb/non concentrated sweet diet and exercise as tolerated. - POCT Glucose (CBG); Future - POCT Glucose (CBG) - blood glucose meter kit and supplies KIT; Dispense based on patient and insurance preference. Use up to four times daily as directed. (FOR ICD-9 250.00, 250.01).  Dispense: 1 each; Refill: 0    Follow-up: Return in about 7 weeks (around 09/07/2021), or if symptoms worsen or fail to improve.    Kelten Enochs Jerold Coombe, NP

## 2021-07-20 NOTE — Patient Instructions (Signed)
Carbohydrate Counting for Diabetes Mellitus, Adult Carbohydrate counting is a method of keeping track of how many carbohydrates you eat. Eating carbohydrates naturally increases the amount of sugar (glucose) in the blood. Counting how many carbohydrates you eat improves your blood glucose control, which helps you manage your diabetes. It is important to know how many carbohydrates you can safely have in each meal. This is different for every person. A dietitian can help you make a meal plan and calculate how many carbohydrates you should have at each meal and snack. What foods contain carbohydrates? Carbohydrates are found in the following foods: Grains, such as breads and cereals. Dried beans and soy products. Starchy vegetables, such as potatoes, peas, and corn. Fruit and fruit juices. Milk and yogurt. Sweets and snack foods, such as cake, cookies, candy, chips, and soft drinks. How do I count carbohydrates in foods? There are two ways to count carbohydrates in food. You can read food labels or learn standard serving sizes of foods. You can use either of the methods or a combination of both. Using the Nutrition Facts label The Nutrition Facts list is included on the labels of almost all packaged foods and beverages in the [place] It includes: The serving size. Information about nutrients in each serving, including the grams (g) of carbohydrate per serving. To use the Nutrition Facts: Decide how many servings you will have. Multiply the number of servings by the number of carbohydrates per serving. The resulting number is the total amount of carbohydrates that you will be having. Learning the standard serving sizes of foods When you eat carbohydrate foods that are not packaged or do not include Nutrition Facts on the label, you need to measure the servings in order to count the amount of carbohydrates. Measure the foods that you will eat with a food scale or measuring cup, if needed. Decide how  many standard-size servings you will eat. Multiply the number of servings by 15. For foods that contain carbohydrates, one serving equals 15 g of carbohydrates. For example, if you eat 2 cups or 10 oz (300 g) of strawberries, you will have eaten 2 servings and 30 g of carbohydrates (2 servings x 15 g = 30 g). For foods that have more than one food mixed, such as soups and casseroles, you must count the carbohydrates in each food that is included. The following list contains standard serving sizes of common carbohydrate-rich foods. Each of these servings has about 15 g of carbohydrates: 1 slice of bread. 1 six-inch (15 cm) tortilla. ? cup or 2 oz (53 g) cooked rice or pasta.  cup or 3 oz (85 g) cooked or canned, drained and rinsed beans or lentils.  cup or 3 oz (85 g) starchy vegetable, such as peas, corn, or squash.  cup or 4 oz (120 g) hot cereal.  cup or 3 oz (85 g) boiled or mashed potatoes, or  or 3 oz (85 g) of a large baked potato.  cup or 4 fl oz (118 mL) fruit juice. 1 cup or 8 fl oz (237 mL) milk. 1 small or 4 oz (106 g) apple.  or 2 oz (63 g) of a medium banana. 1 cup or 5 oz (150 g) strawberries. 3 cups or 1 oz (24 g) popped popcorn. What is an example of carbohydrate counting? To calculate the number of carbohydrates in this sample meal, follow the steps shown below. Sample meal 3 oz (85 g) chicken breast. ? cup or 4 oz (106 g) brown   rice.  cup or 3 oz (85 g) corn. 1 cup or 8 fl oz (237 mL) milk. 1 cup or 5 oz (150 g) strawberries with sugar-free whipped topping. Carbohydrate calculation Identify the foods that contain carbohydrates: Rice. Corn. Milk. Strawberries. Calculate how many servings you have of each food: 2 servings rice. 1 serving corn. 1 serving milk. 1 serving strawberries. Multiply each number of servings by 15 g: 2 servings rice x 15 g = 30 g. 1 serving corn x 15 g = 15 g. 1 serving milk x 15 g = 15 g. 1 serving strawberries x 15 g = 15  g. Add together all of the amounts to find the total grams of carbohydrates eaten: 30 g + 15 g + 15 g + 15 g = 75 g of carbohydrates total. What are tips for following this plan? Shopping Develop a meal plan and then make a shopping list. Buy fresh and frozen vegetables, fresh and frozen fruit, dairy, eggs, beans, lentils, and whole grains. Look at food labels. Choose foods that have more fiber and less sugar. Avoid processed foods and foods with added sugars. Meal planning Aim to have the same amount of carbohydrates at each meal and for each snack time. Plan to have regular, balanced meals and snacks. Where to find more information American Diabetes Association: www.diabetes.org Centers for Disease Control and Prevention: www.cdc.gov Summary Carbohydrate counting is a method of keeping track of how many carbohydrates you eat. Eating carbohydrates naturally increases the amount of sugar (glucose) in the blood. Counting how many carbohydrates you eat improves your blood glucose control, which helps you manage your diabetes. A dietitian can help you make a meal plan and calculate how many carbohydrates you should have at each meal and snack. This information is not intended to replace advice given to you by your health care provider. Make sure you discuss any questions you have with your health care provider. Document Revised: 09/24/2019 Document Reviewed: 09/25/2019 Elsevier Patient Education  2021 Elsevier Inc.  

## 2021-07-21 ENCOUNTER — Other Ambulatory Visit: Payer: Self-pay

## 2021-07-25 ENCOUNTER — Ambulatory Visit: Payer: Self-pay | Admitting: Licensed Clinical Social Worker

## 2021-07-25 ENCOUNTER — Other Ambulatory Visit: Payer: Self-pay

## 2021-07-25 ENCOUNTER — Other Ambulatory Visit: Payer: Self-pay | Admitting: Emergency Medicine

## 2021-07-25 DIAGNOSIS — F4310 Post-traumatic stress disorder, unspecified: Secondary | ICD-10-CM

## 2021-07-25 DIAGNOSIS — N281 Cyst of kidney, acquired: Secondary | ICD-10-CM

## 2021-07-25 DIAGNOSIS — G4700 Insomnia, unspecified: Secondary | ICD-10-CM

## 2021-07-25 NOTE — BH Specialist Note (Signed)
Integrated Behavioral Health Follow Up In-Person Visit  MRN: [id number] Name: Amy Knapp  Total time: 60 minutes  Types of Service: Individual psychotherapy  Interpretor:No. Interpretor Name and Language: N/A  Subjective: Amy Knapp is a 65 y.o. female accompanied by  herself Patient was referred by Amy Shadow, NP for Mental Health. Patient reports the following symptoms/concerns: The patient reports that she is about the same since her last follow-up appointment. She explained that she is wore down physically and emotionally from her job. She shared that she works in a very [employer] and that  her coworkers are always creating stress for her. She noted that she has to walk away for situations 3 to 4 times a day in order to not say something that would get her fired. Amy Knapp discussed her son's progress fighting colon cancer and the recent loss of his twin babies. Amy Knapp discussed her losses in life and how she is helping her son through similar painful life events. She discussed other family stressors and previous trauma history. She noted that she feels like her hurt never went away, but she learned to live with it. She shared that she has a reemergence of the intense feelings of grief that she had over the loss of her son. The client stated that she is looking forward to taking a night to focus on her self care and fells that will help. She stated that she is sleeping about the same, but feels her appetite has improved.Amy Knapp denied any suicidal or homicidal thoughts.  Duration of problem: Years; Severity of problem: moderate  Objective: Mood: Euthymic and Affect: Appropriate Risk of harm to self or others: No plan to harm self or others  Life Context: Family and Social: see above School/Work: see above Self-Care: see above Life Changes: see above  Patient and/or Family's Strengths/Protective Factors: Concrete supports in place  (healthy food, safe environments, etc.)  Goals Addressed: Patient will:  Reduce symptoms of: agitation, anxiety, depression, insomnia, and stress   Increase knowledge and/or ability of: coping skills, healthy habits, self-management skills, and stress reduction   Demonstrate ability to: Increase healthy adjustment to current life circumstances and Increase adequate support systems for patient/family  Progress towards Goals: Ongoing  Interventions: Interventions utilized:  CBT Cognitive Behavioral Therapy was utilized by the clinician during today's follow up session. Clinician met with patient to identify needs related to stressors and functioning, and assess and monitor for signs and symptoms of anxiety and depression, and assess safety. The clinician processed with the patient how they have been doing since the last follow-up session. Clinician encouraged the client to continue to use her coping skills to deal with her current life circumstances. Clinician encouraged the client to share her experiences with grief and normalized the memories resurfacing after the loss of her grandchildren. Clinician again offered her condolences to the patient on her loss.  Standardized Assessments completed: PHQ-9 PHQ-9 = 13  Assessment: Patient currently experiencing see above.   Patient may benefit from see above.  Plan: Follow up with behavioral health clinician on : 07/25/2021 Behavioral recommendations:  Referral(s): [place] (In Clinic) "From scale of 1-10, how likely are you to follow plan?":   Amy Knapp, LCSWA
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
## 2021-07-25 NOTE — Telephone Encounter (Signed)
Herbert Seta gave the resource packet to the patient at her in-person appointment on 10/13. I will follow up with her this Thursday.

## 2021-07-31 ENCOUNTER — Other Ambulatory Visit: Payer: Self-pay

## 2021-07-31 ENCOUNTER — Encounter: Payer: Self-pay | Admitting: Urology

## 2021-07-31 ENCOUNTER — Encounter: Payer: Self-pay | Admitting: Surgery

## 2021-07-31 ENCOUNTER — Ambulatory Visit (INDEPENDENT_AMBULATORY_CARE_PROVIDER_SITE_OTHER): Payer: Self-pay | Admitting: Surgery

## 2021-07-31 ENCOUNTER — Ambulatory Visit (INDEPENDENT_AMBULATORY_CARE_PROVIDER_SITE_OTHER): Payer: Self-pay | Admitting: Urology

## 2021-07-31 VITALS — BP 124/68 | HR 80 | Ht 65.0 in | Wt 172.0 lb

## 2021-07-31 VITALS — BP 147/76 | HR 81 | Temp 98.9°F | Ht 61.0 in | Wt 172.8 lb

## 2021-07-31 DIAGNOSIS — N281 Cyst of kidney, acquired: Secondary | ICD-10-CM

## 2021-07-31 DIAGNOSIS — R310 Gross hematuria: Secondary | ICD-10-CM

## 2021-07-31 DIAGNOSIS — K449 Diaphragmatic hernia without obstruction or gangrene: Secondary | ICD-10-CM

## 2021-07-31 NOTE — Patient Instructions (Signed)
We have spoken today about repairing your Hiatal Hernia. Your surgery will be scheduled at [facility] with Dr. Aleen Campi.  Plan to be in the hospital for 1-2 days if the minimally invasive surgery is completed without having to make a bigger incision. If the bigger incision is made, you will most likely need to be in the hospital 4-5 days. You will be on a liquid diet and need to recover for 1-2 weeks following your surgery prior to doing any of your normal activities. At the 2 week mark, we will see you in the office and if you are doing ok we will advance your diet and activity level as you tolerate.  Please see your Blue (Pre-care) Sheet for more information regarding your surgery. Our surgery scheduler will call you to look at surgery dates and to go over information.   Please call our office with any questions or concerns that you have regarding your surgery and recovery.     Laparoscopic Nissen Fundoplication Laparoscopic Nissen fundoplication is surgery to relieve heartburn and other problems caused by gastric fluids flowing up into your esophagus. The esophagus is the tube that carries food and liquid from your throat to your stomach. Normally, the muscle that sits between your stomach and your esophagus (lower esophageal sphincter or LES) keeps stomach fluids in your stomach. In some people, the LES does not work properly, and stomach fluids flow up into the esophagus. This can happen when part of the stomach bulges through the LES (hiatal hernia). The backward flow of stomach fluids can cause a type of severe and long-standing heartburn that is called gastroesophageal reflux disease (GERD). You may need this surgery if other treatments for GERD have not helped. In a laparoscopic Nissen fundoplication, the upper part of your stomach is wrapped around the lower part of your esophagus to strengthen the LES and prevent reflux. If you have a hiatal hernia, it will also be repaired with this surgery. The  procedure is done through several small incisions in your abdomen. It is performed using a thin, telescopic instrument (laparoscope) and other instruments that can pass through the scope or through other small incisions. Tell a health care provider about: Any allergies you have. All medicines you are taking, including vitamins, herbs, eye drops, creams, and over-the-counter medicines. Any problems you or family members have had with anesthetic medicines. Any blood disorders you have. Any surgeries you have had. Any medical conditions you have. What are the risks? Generally, this is a safe procedure. However, problems may occur, including: Difficulty swallowing (dysphagia). Bloating. Nausea or vomiting. Damage to the lung, causing a collapsed lung. Infection or bleeding. What happens before the procedure? Ask your health care provider about: Changing or stopping your regular medicines. This is especially important if you are taking diabetes medicines or blood thinners. Taking medicines such as aspirin and ibuprofen. These medicines can thin your blood. Do not take these medicines before your procedure if your health care provider asks you not to. Follow your health care provider's instructions about eating or drinking restrictions. Plan to have someone take you home after the procedure. What happens during the procedure? An IV tube will be inserted into one of your veins. It will be used to give you fluids and medicines during the procedure. You will be given a medicine that makes you fall asleep (general anesthetic). Your abdomen will be cleaned with a germ-killing solution (antiseptic). The surgeon will make a small incision in your abdomen and insert a tube  through the incision. Your abdomen will be filled with a gas. This helps the surgeon to see your organs more easily and it makes more space to work. The surgeon will insert the laparoscope through the incision. The scope has a camera  that will send pictures to a monitor in the operating room. The surgeon will make several other small incisions in your abdomen to insert the other instruments that are needed during the procedure. Another instrument (dilator) will be passed through your mouth and down your esophagus into the upper part of your stomach. The dilator will prevent your LES from being closed too tightly during surgery. The surgeon will pass the top portion of your stomach behind the lower part of your esophagus and wrap it all the way around. This will be stitched into place. If you have a hiatal hernia, it will be repaired during this procedure. All instruments will be removed, and your incisions will be closed under your skin with stitches (sutures). Skin adhesive strips may also be used. A bandage (dressing) will be placed on your skin over the incisions. The procedure may vary among health care providers and hospitals. What happens after the procedure? You will be moved to a recovery area. Your blood pressure, heart rate, breathing rate, and blood oxygen level will be monitored often until the medicines you were given have worn off. You will be given pain medicine as needed. Your IV tube will be kept in until you are able to drink fluids. This information is not intended to replace advice given to you by your health care provider. Make sure you discuss any questions you have with your health care provider. Document Released: 10/15/2014 Document Revised: 03/01/2016 Document Reviewed: 05/26/2014 Elsevier Interactive Patient Education  2017 [employer]   Laparoscopic Nissen Fundoplication, Care After Refer to this sheet in the next few weeks. These instructions provide you with information about caring for yourself after your procedure. Your health care provider may also give you more specific instructions. Your treatment has been planned according to current medical practices, but problems sometimes occur. Call  your health care provider if you have any problems or questions after your procedure. What can I expect after the procedure? After the procedure, it is common to have: Difficulty swallowing (dysphagia). Excess gas (bloating). Follow these instructions at home: Medicines  Take medicines only as directed by your health care provider. Do not drive or operate heavy machinery while taking pain medicine. Incision care  There are many different ways to close and cover an incision, including stitches (sutures), skin glue, and adhesive strips. Follow your health care provider's instructions about: Incision care. Bandage (dressing) changes and removal. Incision closure removal. Check your incision areas every day for signs of infection. Watch for: Redness, swelling, or pain. Fluid, blood, or pus. Do not take baths, swim, or use a hot tub until your health care provider approves. Take showers as directed by your health care provider. Eating and drinking  Follow your health care provider's instructions about eating. You may need to follow a liquid-only diet for 2 weeks, followed by a diet of soft foods for 2 weeks. You should return to your usual diet gradually. Drink enough fluid to keep your urine clear or pale yellow. Activity  Return to your normal activities as directed by your health care provider. Ask your health care provider what activities are safe for you. Avoid strenuous exercise. Do not lift anything that is heavier than 10 lb (4.5 kg). Ask your  health care provider when you can: Return to sexual activity. Drive. Go back to work. Contact a health care provider if: You have a fever. Your pain gets worse or is not helped by medicine. You have frequent nausea or vomiting. You have continued abdominal bloating. You have an ongoing (persistent) cough. You have redness, swelling, or pain in any incision areas. You have fluid, blood, or pus coming from any incisions. Get help right  away if: You have trouble breathing. You are unable to swallow. You have persistent vomiting. You have blood in your vomit. You have severe abdominal pain. This information is not intended to replace advice given to you by your health care provider. Make sure you discuss any questions you have with your health care provider. Document Released: 05/17/2004 Document Revised: 03/01/2016 Document Reviewed: 05/26/2014 Elsevier Interactive Patient Education  2017 Elsevier Inc.   Diet After Nissen Fundoplication Surgery This diet information is for patients who have recently had Nissen fundoplication surgery to correct reflux disease or to repair various types of hernias, such as hiatal hernia and intrathoracic stomach. This diet may also be used for other gastrointestinal surgeries, such as Heller myotomy and repair of achalasia. The diet will help control diarrhea, excess gas and swallowing problems, which may occur after this type of surgery. Keeping Your Stomach from Stretching Eat small, frequent meals (six to eight per day). This will help you consume the majority of the nutrients you need without causing your stomach to feel full or distended.  Drinking large amounts of fluids with meals can stretch your stomach. You may drink fluids between meals as often as you like, but limit fluids to 1/2 cup (4 fluid ounces) with meals and one cup (8 fluid ounces) with snacks.  Sit upright while eating and stay upright for 30 minutes after each meal. Gravity can help food move through your digestive tract. Do not lie down after eating. Sit upright for 2 hours after your last meal or snack of the day.  Eat very slowly. Take your time when eating.  Take small bites and chew your food well to help aid in swallowing and digestion.  Avoid crusty breads and sticky, gummy foods, such as bananas, fresh doughy breads, rolls and doughnuts. These types of foods become sticky and difficult to swallow.  Toasted breads  tend to be better tolerated.  Lastly, if you eat sweets, consume them at the end of your meal to avoid a group of symptoms referred to as "dumping syndrome". This describes the rapid emptying of foods from the stomach to the small intestine. Sweetened beverages, candy and desserts move more rapidly and dump quickly into the intestines. This can cause symptoms of nausea, weakness, cold sweats, cramps, diarrhea and dizzy spells.  Avoiding Gas Avoid drinking through a straw. Do not chew gum or tobacco. These actions cause you to swallow air, which produces excess gas in your stomach. Chew with your mouth closed.  Avoid any foods that cause stomach gas and distention. These foods include corn, dried beans, peas, lentils, onions, broccoli, cauliflower and any food from the cabbage family.  Avoid carbonated drinks, alcohol, citrus and tomato products.  When will I be able to eat a soft diet? After Nissen fundoplication surgery, your diet will be advanced slowly by your surgeon. Generally, you will be on a clear liquid diet for the first few meals. Then you will advance to the full liquid diet for a meal or two and eventually to a Nissen soft diet.  Please be aware that each patient's tolerance to food is different. Your doctor will advance your diet depending on how well you progress after surgery. Clear Liquid Diet  The first diet after surgery is the clear liquid diet. It includes the following liquids: Apple juice  Cranberry juice  Grape juice  Chicken broth  Beef broth  Flavored gelatin (Jell-O)  Decaf tea and coffee  Caffeinated beverages are permitted based on tolerance  Popsicles  [place] ice Carbonated drinks (sodas) are not allowed for the first six to eight weeks after surgery. After this time you can try them again in small amounts.  Full Liquid Diet The full liquid diet contains anything on the clear liquid diet, plus: Milk, soy, rice and almond (no chocolate)  Cream of wheat, cream  of rice, grits  Strained creamed soups (no tomato or broccoli)  Vanilla and strawberry-flavored ice cream  Sherbet  Blended, custard styled or whipped yogurt (plain or vanilla only)  Vanilla and butterscotch pudding (no chocolate or coconut)  Nutritional drinks including Ensure, Boost, Carnation Instant Breakfast (no chocolate-flavored) Note: Dairy products, such as milk, ice cream and pudding, may cause diarrhea in some people just after surgery. You may need to avoid milk products. If so, substitute them with lactose-free beverages, such as soy, rice, Lactaid or almond milks.  Nissen Soft Diet Food Category Foods to Choose Foods to Avoid  Beverages Milk, such as, whole, 2%, 1%, non-fat, or skim, soy, rice, almond  Caffeinated and decaf tea and coffee  Powdered drink mixes (in moderation)  Non-citrus juices (apple, grape, cranberry or blends of these)  Fruit nectars  Nutritional drinks including Boost, Ensure, Carnation Instant Breakfast Chocolate milk, cocoa or other chocolate-flavored drinks  Carbonated drinks  Alcohol  Citrus juices like orange, grapefruit, lemon and lime  Breads Pancakes, [place] toast and waffles  Crackers (saltine, butter, soda, graham, Gold[place] and Cheese Nips)  Toasted bread Untoasted bread, bagels, [place] and hard rolls, English muffins  Crackers with nuts, seeds, fresh or dried fruit, coconut, or highly seasoned, such as garlic or onion-flavored  Sweet rolls, coffee cake or doughnuts  Cereals Well cooked cereals, such as oatmeal (plain or flavored)  Cold cereal (Cornflakes, Rice Krispies, Cheerios, Special K plain, Rice Chex and puffed rice) Very coarse cereal, such as bran, shredded wheat  Any cereal with fresh or dried fruit, coconut, seeds or nuts  Desserts Eat in moderation and do not eat desserts or sweets by themselves. Plain cakes, cookies and cream-filled pies  Vanilla and butterscotch pudding or custard  Ice cream, ice milk, frozen  yogurt and sherbet  Gelatin made from allowed foods  Fruit ices and popsicles Desserts containing chocolate, coconut, nuts, seeds, fresh or dried fruit, peppermint or spearmint  Eggs  Poached, hard boiled or scrambled Fried eggs and highly seasoned eggs (deviled eggs)  Fats Eat in moderation. Butter and margarine  Mayonnaise and vegetable oils  Mildly seasoned cream sauces and gravies  Plain cream cheese  Sour cream Highly seasoned salad dressings, cream sauces and gravies  Bacon, bacon fat, ham fat, lard and salt pork  Fried foods  Nuts  Fruits Fruit juice  Any canned or cooked fruit except those listed in the AVOID column ALL fresh fruits, such as citrus, bananas and pineapple  Canned pineapple  Dried fruits, such as raisins, berries  Fruits with seeds, such as berries, kiwi and figs  Meat, Fish, Poultry, and [employer] may be ground, minced or chopped to ease swallowing and digestion  Tender, well cooked and moist cuts of beef, chicken, [place] and pork  Veal and lamb  Flaky, cooked fish  Canned tuna  Cottage and ricotta cheeses  Mild cheese, such as American, brick, mozzarella and baby Swiss  Creamy peanut butter  Plain custard or blended fruit yogurt  Moist casseroles, such as macaroni & cheese, tuna noodle  Grilled or toasted cheese sandwich Tough meats with a lot of gristle  Fried, highly seasoned, smoked and fatty meat, fish or poultry, such as frankfurters, luncheon meats, sausage, bacon, spare ribs, beef brisket, sardines, anchovies, duck and goose  Chili and other entrees made with pepper or chili pepper  Shellfish  Strongly flavored cheeses, such as sharp cheese, extra sharp cheddar, cheese containing peppers or other seasonings  Crunchy peanut butter  Any yogurt with nuts, seeds, coconut, strawberries or raspberries  Potatoes and Starches Peeled, mashed or boiled white or sweet potatoes  Oven-baked potatoes without skin  Well cooked white rice, enriched  noodles, barley, spaghetti, macaroni and other pastas Fried potatoes, potato skins and potato chips  Hard and soft taco shells  Fried, brown or wild rice  Soups Mildly flavored meat stocks  Cream soups made from allowed foods Highly seasoned soups and tomato based soups, cream soups made with gas producing vegetables, such as broccoli, cauliflower, onion, etc.  Sweets and Snacks Use in moderation and do not eat large amounts of sweets by themselves. Syrup, honey, jelly and seedless jam  Plain hard candies and plain candies made with allowed ingredients  Molasses  Marshmallows  Other candy made from allowed ingredients  Thin pretzels Jam, marmalade and preserves  Chocolate in any form  Any candy containing nuts, coconut, seeds, peppermint, spearmint or dried or fresh fruit  Popcorn, potato chips, tortilla chips  Soft or hard thick pretzels, such as sourdough  Vegetables Well cooked soft vegetables without seeds or skins, such as asparagus tips, beets, carrots, green and wax beans, chopped spinach, tender canned baby peas, squash and pumpkin Raw vegetables, tomatoes, tomato juice, tomato sauce and V-8 juice  Gas producing vegetables, such as broccoli, Brussel sprouts, cabbage, cauliflower, onions, corn, cucumber, green peppers, rutabagas, turnips, radishes and sauerkraut  Dried beans, peas and lentils  Miscellaneous Salt and spices in moderation  Mustard and vinegar in moderation Fried or highly seasoned foods  Coconut and seeds  Pickles and olives  Chili sauces, ketchup, barbecue sauce, horseradish, black pepper, chili powder and onion and garlic seasonings  Any other strongly flavored seasoning, condiment, spice or herb not tolerated  Any food not tolerated

## 2021-07-31 NOTE — Progress Notes (Signed)
07/31/21 3:07 PM   Amy Knapp [birth year] [id number]  CC: Renal cyst, gross hematuria  HPI: 65 year old female who was recently found to have a minimally complex right-sided cyst on CT in September 2022 performed for a hiatal hernia.  She also reports some bright red gross hematuria in the last few weeks, and some mild dysuria.  Urinalysis today is benign.  She has a 20+ pack year smoking history and quit a few weeks ago.  She previously worked briefly with [employer].   PMH: Past Medical History:  Diagnosis Date   Allergy    Anxiety    Arthritis    Asthma    Broken leg 2007   Right   Cataract    Complex renal cyst 06/24/2021   COPD (chronic obstructive pulmonary disease) (HCC)    Depression    Diabetes mellitus without complication (HCC)    Emphysema of lung (HCC)    Essential hypertension 04/20/2021   GERD (gastroesophageal reflux disease)    Gout    Hyperlipidemia    Sleep apnea    Thyroid disease     Surgical History: Past Surgical History:  Procedure Laterality Date   ABDOMINAL HYSTERECTOMY  2000   Partial   COLONOSCOPY WITH PROPOFOL N/A 04/11/2021   Procedure: COLONOSCOPY WITH PROPOFOL;  Surgeon: Midge Minium, MD;  Location: [facility] ENDOSCOPY;  Service: Endoscopy;  Laterality: N/A;   ESOPHAGOGASTRODUODENOSCOPY N/A 04/11/2021   Procedure: ESOPHAGOGASTRODUODENOSCOPY (EGD);  Surgeon: Midge Minium, MD;  Location: [facility] ENDOSCOPY;  Service: Endoscopy;  Laterality: N/A;   LEG SURGERY Right 10/08/2005   broken right leg   TUBAL LIGATION      Family History: Family History  Problem Relation Age of Onset   Cancer Mother    Heart attack Father    Heart attack Sister    Cancer Brother    Diabetes Daughter    Diabetes Son    Hypertension Maternal Aunt    Breast cancer Neg Hx     Social History:  reports that she has been smoking cigarettes. She has been smoking an average of .15 packs per day. She quit smokeless tobacco use about 7 weeks ago. She reports  that she does not drink alcohol and does not use drugs.  Physical Exam: BP 124/68   Pulse 80   Ht 5\' 5"  (1.651 m)   Wt 172 lb (78 kg)   BMI 28.62 kg/m    Constitutional:  Alert and oriented, No acute distress. Cardiovascular: No clubbing, cyanosis, or edema. Respiratory: Normal respiratory effort, no increased work of breathing. GI: Abdomen is soft, nontender, nondistended, no abdominal masses  Laboratory Data: Reviewed  Pertinent Imaging: I have personally viewed and interpreted the CT dated 06/21/2021 that shows a large 8 cm simple right renal cyst as well as a 3 cm cyst in the right upper pole with a thin septation likely representing a minimally complex renal cyst, but further MRI recommended  Assessment & Plan:   65 year old female with right renal cyst as well as gross hematuria of unclear etiology.  Urinalysis today benign, and no stone seen on CT.  I recommended MRI for further evaluation of her minimally complex right renal cyst, as well as cystoscopy and cytology to complete hematuria work-up  We discussed common possible etiologies of hematuria including malignancy, urolithiasis, medical renal disease, and idiopathic. Standard workup recommended by the [facility] includes imaging with CT urogram to assess the upper tracts, and cystoscopy. Cytology is performed on patient's with gross hematuria to look for  malignant cells in the urine.  MRI abdomen for further evaluation of right renal cyst Cystoscopy for further evaluation of gross hematuria   Legrand Rams, MD 07/31/2021  [facility] [address], [address] [place], [place] [address] [phone]

## 2021-08-01 ENCOUNTER — Telehealth: Payer: Self-pay | Admitting: Surgery

## 2021-08-01 ENCOUNTER — Telehealth: Payer: Self-pay

## 2021-08-01 LAB — MICROSCOPIC EXAMINATION
Bacteria, UA: NONE SEEN
RBC, Urine: NONE SEEN /hpf (ref 0–2)

## 2021-08-01 LAB — URINALYSIS, COMPLETE
Bilirubin, UA: NEGATIVE
Glucose, UA: NEGATIVE
Leukocytes,UA: NEGATIVE
Nitrite, UA: NEGATIVE
RBC, UA: NEGATIVE
Specific Gravity, UA: >1.03 — ABNORMAL HIGH (ref 1.005–1.030)
Urobilinogen, Ur: 0.2 mg/dL (ref 0.2–1.0)
pH, UA: 5.5 (ref 5.0–7.5)

## 2021-08-01 NOTE — Telephone Encounter (Signed)
Outgoing call is made, left message for patient to call.  Please inform patient of  Pre-Admission date/time, COVID Testing date and Surgery date.  Surgery Date: 08/29/21 Preadmission Testing Date: 08/18/21 (phone 8a-1p) Covid Testing Date: 08/25/21 @ 8:20 am - patient [facility] to [facility] ([address] [facility]) between 8a-12:00 pm  Also patient will need to call at [phone], between 1-3:00pm the day before surgery, to find out what time to arrive for surgery.

## 2021-08-01 NOTE — Telephone Encounter (Signed)
-----   Message from Sondra Come, MD sent at 08/01/2021  8:15 AM EDT ----- No infection on UA, please schedule cysto as we discussed in clinic for further workup of her hematuria  Legrand Rams, MD 08/01/2021

## 2021-08-01 NOTE — Telephone Encounter (Signed)
Patient returns call, she is now informed of all dates regarding her surgery and verbalized understanding.  ?

## 2021-08-01 NOTE — Telephone Encounter (Signed)
Called pt no answer. LM for pt informing pt of negative UA. Cysto scheduled.

## 2021-08-02 ENCOUNTER — Encounter: Payer: Self-pay | Admitting: Surgery

## 2021-08-02 NOTE — H&P (View-Only) (Signed)
Outpatient Surgical Follow Up  08/02/2021  Amy Knapp is an 65 y.o. female.   Chief Complaint  Patient presents with   Routine Post Op    Hiatal hernia repair    HPI: Amy Knapp is a 65 y.o. female shx of hiatal hernia and reflux.  She reports that she has been having reflux for several months.  Worsening when she lays down and when she eats certain meals.  She did have a recent EGD as well as a colonoscopy and I have reviewed the images.  There is evidence of esophageal stricture that was dilated.  There are also biopsies taken.  No evidence of dysplasia.  Evidence of a small hiatal hernia.  She also had a CT scan of the chest from last year that have personally reviewed showing emphysema and small hiatal hernia.   she quit smoking several weeks.ago  She does have COPD and uses inhalers.  She does not wear any oxygen at this time. She did have a history of abdominal hysterectomy and tubal ligation in the past. She is diabetic but is currently well controlled. Last HbA1c 7.1 last CBC and BMP were normal.  SHe is fairly functional he denies any dyspnea on exertion or any angina.   Currently she denies any dysphagia.  She did report that before her esophageal dilation she used to have some mild dysphagia for solids.  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   Past Medical History:  Diagnosis Date   Allergy    Anxiety    Arthritis    Asthma    Broken leg 2007   Right   Cataract    Complex renal cyst 06/24/2021   COPD (chronic obstructive pulmonary disease) (HCC)    Depression    Diabetes mellitus without complication (HCC)    Emphysema of lung (HCC)    Essential hypertension 04/20/2021   GERD (gastroesophageal reflux disease)    Gout    Hyperlipidemia    Sleep apnea    Thyroid disease     Past Surgical History:  Procedure  Laterality Date   ABDOMINAL HYSTERECTOMY  2000   Partial   COLONOSCOPY WITH PROPOFOL N/A 04/11/2021   Procedure: COLONOSCOPY WITH PROPOFOL;  Surgeon: Midge Minium, MD;  Location: [facility] ENDOSCOPY;  Service: Endoscopy;  Laterality: N/A;   ESOPHAGOGASTRODUODENOSCOPY N/A 04/11/2021   Procedure: ESOPHAGOGASTRODUODENOSCOPY (EGD);  Surgeon: Midge Minium, MD;  Location: [facility] ENDOSCOPY;  Service: Endoscopy;  Laterality: N/A;   LEG SURGERY Right 10/08/2005   broken right leg   PATELLA FRACTURE SURGERY Right    TUBAL LIGATION      Family History  Problem Relation Age of Onset   Cancer Mother    Heart attack Father    Heart attack Sister    Cancer Brother    Diabetes Daughter    Diabetes Son    Hypertension Maternal Aunt    Breast cancer Neg Hx     Social History:  reports that she has been smoking cigarettes. She has been smoking an average of .15 packs per day. She quit smokeless tobacco use about 8 weeks ago. She reports that she does not drink alcohol and does not use drugs.  Allergies:  Allergies  Allergen Reactions   Celery Oil     itching   Demerol [Meperidine]     Burning, feels hot   Morphine And Related Other (See  Comments)    Muscle Spasms   Penicillins     blisters    Medications reviewed.    ROS Full ROS performed and is otherwise negative other than what is stated in HPI   BP (!) 147/76   Pulse 81   Temp 98.9 F (37.2 C) (Oral)   Ht 5\' 1"  (1.549 m)   Wt 172 lb 12.8 oz (78.4 kg)   SpO2 94%   BMI 32.65 kg/m   Physical Exam CONSTITUTIONAL: NAD EYES: Pupils are equal, Knapp, a Sclera are non-icteric. EARS, NOSE, MOUTH AND THROAT: She is wearing a mask. Hearing is intact to voice. LYMPH NODES:  Lymph nodes in the neck are normal. RESPIRATORY:  Lungs are clear. There is normal respiratory effort, with equal breath sounds bilaterally, and without pathologic use of accessory muscles. CARDIOVASCULAR: Heart is regular without murmurs, gallops, or rubs. GI: The  abdomen is soft,  and nondistended. Mild tenderness on epigastrium. There are no palpable masses. There is no hepatosplenomegaly. There are normal bowel sounds in all quadrants. GU: Rectal deferred.   MUSCULOSKELETAL: Normal muscle strength and tone. No cyanosis or edema.   SKIN: wound from [facility] excision healing well no infection. NEUROLOGIC: Motor and sensation is grossly normal. Cranial nerves are grossly intact. PSYCH:  Oriented to person, place and time. Affect is normal.      No results found for this or any previous visit (from the past 48 hour(s)). No results found.  Assessment/Plan:  65 year old female with history of recalcitrant reflux and a hiatal hernia with prior stricture associated to chronic acid exposure.  Discussed with the patient in detail about her disease process.  She does have significant quality of life issues and chronic cough related to significant acid exposure.  I do think that a fundoplication will benefit especially her pulmonary symptoms.  We discussed at length and in detail about the proposed surgery and I do think that she will be a good candidate for robotic approach.  Procedure discussed with patient detail.  Risk benefits and possible complications including but not limited to infection, esophageal injuries, chronic pain, dysphagia and chronic bloating.  She understands and wished to proceed. Greater than 50% of the 40 minutes  visit was spent in counseling/coordination of care   70, MD [facility] General Surgeon

## 2021-08-02 NOTE — Progress Notes (Signed)
Outpatient Surgical Follow Up  08/02/2021  Amy Knapp is an 65 y.o. female.   Chief Complaint  Patient presents with   Routine Post Op    Hiatal hernia repair    HPI: Amy Knapp is a 66 y.o. female shx of hiatal hernia and reflux.  She reports that she has been having reflux for several months.  Worsening when she lays down and when she eats certain meals.  She did have a recent EGD as well as a colonoscopy and I have reviewed the images.  There is evidence of esophageal stricture that was dilated.  There are also biopsies taken.  No evidence of dysplasia.  Evidence of a small hiatal hernia.  She also had a CT scan of the chest from last year that have personally reviewed showing emphysema and small hiatal hernia.   she quit smoking several weeks.ago  She does have COPD and uses inhalers.  She does not wear any oxygen at this time. She did have a history of abdominal hysterectomy and tubal ligation in the past. She is diabetic but is currently well controlled. Last HbA1c 7.1 last CBC and BMP were normal.  SHe is fairly functional he denies any dyspnea on exertion or any angina.   Currently she denies any dysphagia.  She did report that before her esophageal dilation she used to have some mild dysphagia for solids.  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   Past Medical History:  Diagnosis Date   Allergy    Anxiety    Arthritis    Asthma    Broken leg 2007   Right   Cataract    Complex renal cyst 06/24/2021   COPD (chronic obstructive pulmonary disease) (HCC)    Depression    Diabetes mellitus without complication (HCC)    Emphysema of lung (HCC)    Essential hypertension 04/20/2021   GERD (gastroesophageal reflux disease)    Gout    Hyperlipidemia    Sleep apnea    Thyroid disease     Past Surgical History:  Procedure  Laterality Date   ABDOMINAL HYSTERECTOMY  2000   Partial   COLONOSCOPY WITH PROPOFOL N/A 04/11/2021   Procedure: COLONOSCOPY WITH PROPOFOL;  Surgeon: Midge Minium, MD;  Location: [facility] ENDOSCOPY;  Service: Endoscopy;  Laterality: N/A;   ESOPHAGOGASTRODUODENOSCOPY N/A 04/11/2021   Procedure: ESOPHAGOGASTRODUODENOSCOPY (EGD);  Surgeon: Midge Minium, MD;  Location: [facility] ENDOSCOPY;  Service: Endoscopy;  Laterality: N/A;   LEG SURGERY Right 10/08/2005   broken right leg   PATELLA FRACTURE SURGERY Right    TUBAL LIGATION      Family History  Problem Relation Age of Onset   Cancer Mother    Heart attack Father    Heart attack Sister    Cancer Brother    Diabetes Daughter    Diabetes Son    Hypertension Maternal Aunt    Breast cancer Neg Hx     Social History:  reports that she has been smoking cigarettes. She has been smoking an average of .15 packs per day. She quit smokeless tobacco use about 8 weeks ago. She reports that she does not drink alcohol and does not use drugs.  Allergies:  Allergies  Allergen Reactions   Celery Oil     itching   Demerol [Meperidine]     Burning, feels hot   Morphine And Related Other (See  Comments)    Muscle Spasms   Penicillins     blisters    Medications reviewed.    ROS Full ROS performed and is otherwise negative other than what is stated in HPI   BP (!) 147/76   Pulse 81   Temp 98.9 F (37.2 C) (Oral)   Ht 5\' 1"  (1.549 m)   Wt 172 lb 12.8 oz (78.4 kg)   SpO2 94%   BMI 32.65 kg/m   Physical Exam CONSTITUTIONAL: NAD EYES: Pupils are equal, round, a Sclera are non-icteric. EARS, NOSE, MOUTH AND THROAT: She is wearing a mask. Hearing is intact to voice. LYMPH NODES:  Lymph nodes in the neck are normal. RESPIRATORY:  Lungs are clear. There is normal respiratory effort, with equal breath sounds bilaterally, and without pathologic use of accessory muscles. CARDIOVASCULAR: Heart is regular without murmurs, gallops, or rubs. GI: The  abdomen is soft,  and nondistended. Mild tenderness on epigastrium. There are no palpable masses. There is no hepatosplenomegaly. There are normal bowel sounds in all quadrants. GU: Rectal deferred.   MUSCULOSKELETAL: Normal muscle strength and tone. No cyanosis or edema.   SKIN: wound from [facility] excision healing well no infection. NEUROLOGIC: Motor and sensation is grossly normal. Cranial nerves are grossly intact. PSYCH:  Oriented to person, place and time. Affect is normal.      No results found for this or any previous visit (from the past 48 hour(s)). No results found.  Assessment/Plan:  65 year old female with history of recalcitrant reflux and a hiatal hernia with prior stricture associated to chronic acid exposure.  Discussed with the patient in detail about her disease process.  She does have significant quality of life issues and chronic cough related to significant acid exposure.  I do think that a fundoplication will benefit especially her pulmonary symptoms.  We discussed at length and in detail about the proposed surgery and I do think that she will be a good candidate for robotic approach.  Procedure discussed with patient detail.  Risk benefits and possible complications including but not limited to infection, esophageal injuries, chronic pain, dysphagia and chronic bloating.  She understands and wished to proceed. Greater than 50% of the 40 minutes  visit was spent in counseling/coordination of care   70, MD [facility] General Surgeon

## 2021-08-08 ENCOUNTER — Ambulatory Visit: Payer: Self-pay | Admitting: Licensed Clinical Social Worker

## 2021-08-15 ENCOUNTER — Telehealth: Payer: Self-pay | Admitting: Licensed Clinical Social Worker

## 2021-08-15 NOTE — Telephone Encounter (Signed)
Called to check in re. [facility] referral. Has not had a chance to go yet. Also inquired about cheaper housing options and I said I would get back to her on 11/10 (specifically about [facility]).

## 2021-08-16 ENCOUNTER — Encounter: Payer: Self-pay | Admitting: Urology

## 2021-08-16 ENCOUNTER — Ambulatory Visit (INDEPENDENT_AMBULATORY_CARE_PROVIDER_SITE_OTHER): Payer: Self-pay | Admitting: Urology

## 2021-08-16 ENCOUNTER — Other Ambulatory Visit: Payer: Self-pay

## 2021-08-16 VITALS — BP 155/76 | HR 83 | Ht 65.0 in | Wt 172.0 lb

## 2021-08-16 DIAGNOSIS — R310 Gross hematuria: Secondary | ICD-10-CM

## 2021-08-16 MED ORDER — LIDOCAINE HCL URETHRAL/MUCOSAL 2 % EX GEL
1.0000 "application " | Freq: Once | CUTANEOUS | Status: AC
  Administered 2021-08-16: 1 via URETHRAL

## 2021-08-16 NOTE — Addendum Note (Signed)
Addended by: Frankey Shown on: 08/16/2021 04:07 PM   Modules accepted: Orders

## 2021-08-16 NOTE — Progress Notes (Signed)
Cystoscopy Procedure Note:  Indication: Gross hematuria  After informed consent and discussion of the procedure and its risks, Amy Knapp was positioned and prepped in the standard fashion. Cystoscopy was performed with a flexible cystoscope. The urethra, bladder neck and entire bladder was visualized in a standard fashion. The ureteral orifices were visualized in their normal location and orientation.  The bladder was grossly normal throughout with no abnormalities, no abnormalities on retroflexion.  Cytology was sent.   Findings: Normal cystoscopy  Assessment and Plan: Call with cytology results  Call with MRI results for further evaluation of right renal cyst  Legrand Rams, MD 08/16/2021

## 2021-08-16 NOTE — Addendum Note (Signed)
Addended by: Frankey Shown on: 08/16/2021 03:31 PM   Modules accepted: Orders

## 2021-08-17 ENCOUNTER — Telehealth: Payer: Self-pay | Admitting: Licensed Clinical Social Worker

## 2021-08-17 NOTE — Telephone Encounter (Signed)
Left message with information about [facility] and [facility] applications.

## 2021-08-18 ENCOUNTER — Other Ambulatory Visit: Payer: Self-pay

## 2021-08-18 ENCOUNTER — Encounter
Admission: RE | Admit: 2021-08-18 | Discharge: 2021-08-18 | Disposition: A | Discharge status: Home / Self Care | Payer: Self-pay | Source: Ambulatory Visit | Attending: Surgery | Admitting: Surgery

## 2021-08-18 ENCOUNTER — Other Ambulatory Visit: Payer: Self-pay | Admitting: Gerontology

## 2021-08-18 ENCOUNTER — Other Ambulatory Visit
Admission: RE | Admit: 2021-08-18 | Discharge: 2021-08-18 | Disposition: A | Discharge status: Home / Self Care | Payer: Self-pay | Source: Ambulatory Visit | Attending: Surgery | Admitting: Surgery

## 2021-08-18 ENCOUNTER — Encounter: Payer: Self-pay | Admitting: Urgent Care

## 2021-08-18 VITALS — Ht 60.5 in | Wt 160.0 lb

## 2021-08-18 DIAGNOSIS — J449 Chronic obstructive pulmonary disease, unspecified: Secondary | ICD-10-CM

## 2021-08-18 DIAGNOSIS — Z01812 Encounter for preprocedural laboratory examination: Secondary | ICD-10-CM

## 2021-08-18 DIAGNOSIS — I10 Essential (primary) hypertension: Secondary | ICD-10-CM | POA: Insufficient documentation

## 2021-08-18 DIAGNOSIS — E1140 Type 2 diabetes mellitus with diabetic neuropathy, unspecified: Secondary | ICD-10-CM

## 2021-08-18 DIAGNOSIS — Z01818 Encounter for other preprocedural examination: Secondary | ICD-10-CM | POA: Insufficient documentation

## 2021-08-18 HISTORY — DX: Personal history of other diseases of the digestive system: Z87.19

## 2021-08-18 HISTORY — DX: Hypothyroidism, unspecified: E03.9

## 2021-08-18 LAB — CBC
HCT: 40 % (ref 36.0–46.0)
Hemoglobin: 13.5 g/dL (ref 12.0–15.0)
MCH: 31 pg (ref 26.0–34.0)
MCHC: 33.8 g/dL (ref 30.0–36.0)
MCV: 92 fL (ref 80.0–100.0)
Platelets: 240 10*3/uL (ref 150–400)
RBC: 4.35 MIL/uL (ref 3.87–5.11)
RDW: 12 % (ref 11.5–15.5)
WBC: 5.6 10*3/uL (ref 4.0–10.5)
nRBC: 0 % (ref 0.0–0.2)

## 2021-08-18 LAB — BASIC METABOLIC PANEL
Anion gap: 4 — ABNORMAL LOW (ref 5–15)
BUN: 12 mg/dL (ref 8–23)
CO2: 30 mmol/L (ref 22–32)
Calcium: 9.4 mg/dL (ref 8.9–10.3)
Chloride: 104 mmol/L (ref 98–111)
Creatinine, Ser: 0.82 mg/dL (ref 0.44–1.00)
GFR, Estimated: >60 mL/min (ref >60)
Glucose, Bld: 85 mg/dL (ref 70–99)
Potassium: 3.8 mmol/L (ref 3.5–5.1)
Sodium: 138 mmol/L (ref 135–145)

## 2021-08-18 LAB — CYTOLOGY - NON PAP

## 2021-08-18 NOTE — Patient Instructions (Addendum)
Your procedure is scheduled on: Tuesday, November 22 Report to the Registration Desk on the 1st floor of the [facility] To find out your arrival time, please call [phone] between 1PM - 3PM on: Monday, November 21  REMEMBER: Instructions that are not followed completely may result in serious medical risk, up to and including death; or upon the discretion of your surgeon and anesthesiologist your surgery may need to be rescheduled.  Do not eat food after midnight the night before surgery.  No gum chewing, lozengers or hard candies.  You may however, drink water up to 2 hours before you are scheduled to arrive for your surgery. Do not drink anything within 2 hours of your scheduled arrival time.  TAKE THESE MEDICATIONS THE MORNING OF SURGERY WITH A SIP OF WATER:  Albuterol inhaler Atorvastatin Fluticasone-Umeclidin-Vilant inhaler Gabapentin Levothyroxine Omeprazole - (take one the night before and one on the morning of surgery - helps to prevent nausea after surgery.)  Use inhalers on the day of surgery and bring to the hospital.  One week prior to surgery: starting November 15 Stop Anti-inflammatories (NSAIDS) such as Advil, Aleve, Ibuprofen, Motrin, Naproxen, Naprosyn and Aspirin based products such as Excedrin, Goodys Powder, BC Powder. Stop ANY OVER THE COUNTER supplements until after surgery. You may however, continue to take Tylenol if needed for pain up until the day of surgery.  No Alcohol for 24 hours before or after surgery.  No Smoking including e-cigarettes for 24 hours prior to surgery.  No chewable tobacco products for at least 6 hours prior to surgery.  No nicotine patches on the day of surgery.  Do not use any "recreational" drugs for at least a week prior to your surgery.  Please be advised that the combination of cocaine and anesthesia may have negative outcomes, up to and including death. If you test positive for cocaine, your surgery will be  cancelled.  On the morning of surgery brush your teeth with toothpaste and water, you may rinse your mouth with mouthwash if you wish. Do not swallow any toothpaste or mouthwash.  Use CHG Soap as directed on instruction sheet.  Do not wear jewelry, make-up, hairpins, clips or nail polish.  Do not wear lotions, powders, or perfumes.   Do not shave body from the neck down 48 hours prior to surgery just in case you cut yourself which could leave a site for infection.  Also, freshly shaved skin may become irritated if using the CHG soap.  Contact lenses, hearing aids and dentures may not be worn into surgery.  Do not bring valuables to the hospital. [facility] is not responsible for any missing/lost belongings or valuables.   Notify your doctor if there is any change in your medical condition (cold, fever, infection).  Wear comfortable clothing (specific to your surgery type) to the hospital.  After surgery, you can help prevent lung complications by doing breathing exercises.  Take deep breaths and cough every 1-2 hours. Your doctor may order a device called an Incentive Spirometer to help you take deep breaths.  If you are being discharged the day of surgery, you will not be allowed to drive home. You will need a responsible adult (18 years or older) to drive you home and stay with you that night.   If you are taking public transportation, you will need to have a responsible adult (18 years or older) with you. Please confirm with your physician that it is acceptable to use public transportation.  Please call the Pre-admissions Testing Dept. at [phone] if you have any questions about these instructions.  Surgery Visitation Policy:  Patients undergoing a surgery or procedure may have one family member or support person with them as long as that person is not COVID-19 positive or experiencing its symptoms.  That person may remain in the waiting area during the procedure and  may rotate out with other people.

## 2021-08-21 ENCOUNTER — Telehealth: Payer: Self-pay

## 2021-08-21 ENCOUNTER — Telehealth: Payer: Self-pay | Admitting: Surgery

## 2021-08-21 NOTE — Telephone Encounter (Signed)
Left message on her VM stating labs are all ok.

## 2021-08-21 NOTE — Telephone Encounter (Signed)
Spoke with the patient and she may return to work with restrictions. She is with the understanding that she may not drive if she is still having pain or taking narcotic pain medications. She agrees and will only be sitting at a desk. I will write this up for her and have her letter sitting at the front for her to pick up .

## 2021-08-21 NOTE — Telephone Encounter (Signed)
Incoming call from patient.  She is having hiatal hernia repair with Dr. Everlene Farrier on 08/29/21. She is upset about how long it may take for her recovery. She wants to go back to work the following Monday 09/04/21.  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  Patient states that she can't afford to be out of work long at all, she has to work.  I had explained to her that it can take several weeks for her body to heal.  She is anxious and worried and says she can't afford to be out of work.  Please call patient as she is concerned about the recovery period and the request for returning back to work as soon as possible.  Thank you.

## 2021-08-22 ENCOUNTER — Other Ambulatory Visit: Payer: Self-pay

## 2021-08-22 ENCOUNTER — Telehealth: Payer: Self-pay

## 2021-08-22 MED ORDER — MONTELUKAST SODIUM 10 MG PO TABS
ORAL_TABLET | Freq: Every day | ORAL | 1 refills | Status: DC
Start: 2021-08-22 — End: 2021-10-31
  Filled 2021-08-22: qty 16, 16d supply, fill #0
  Filled 2021-09-25: qty 30, 30d supply, fill #1
  Filled 2021-10-31: qty 14, 14d supply, fill #2

## 2021-08-22 NOTE — Progress Notes (Signed)
Patient pre-screened for BCCCP eligibility due to COVID 19 precautions. Two patient identifiers used for verification that I was speaking to correct patient.  Patient to Present directly to [facility] 08/23/21 for BCCCP screening mammogram.  

## 2021-08-22 NOTE — Telephone Encounter (Signed)
-----   Message from Sondra Come, MD sent at 08/22/2021  8:14 AM EST ----- Good news, no cancer cells on urine specimen.  Still needs MRI for further evaluation of renal lesions, and will call with those results as well  Legrand Rams, MD 08/22/2021

## 2021-08-22 NOTE — Telephone Encounter (Signed)
Called pt no answer. Left detailed message per DPR. Advised pt to call back for questions or concerns.  

## 2021-08-23 ENCOUNTER — Ambulatory Visit: Payer: Self-pay | Attending: Oncology

## 2021-08-23 ENCOUNTER — Ambulatory Visit
Admission: RE | Admit: 2021-08-23 | Discharge: 2021-08-23 | Disposition: A | Discharge status: Home / Self Care | Payer: Self-pay | Source: Ambulatory Visit | Attending: Oncology | Admitting: Oncology

## 2021-08-23 ENCOUNTER — Other Ambulatory Visit: Payer: Self-pay

## 2021-08-23 DIAGNOSIS — Z0000 Encounter for general adult medical examination without abnormal findings: Secondary | ICD-10-CM | POA: Insufficient documentation

## 2021-08-24 ENCOUNTER — Ambulatory Visit: Payer: Self-pay | Admitting: Licensed Clinical Social Worker

## 2021-08-25 ENCOUNTER — Other Ambulatory Visit
Admission: RE | Admit: 2021-08-25 | Discharge: 2021-08-25 | Disposition: A | Discharge status: Home / Self Care | Payer: Self-pay | Source: Ambulatory Visit | Attending: Surgery | Admitting: Surgery

## 2021-08-25 ENCOUNTER — Other Ambulatory Visit: Payer: Self-pay

## 2021-08-25 DIAGNOSIS — Z20822 Contact with and (suspected) exposure to covid-19: Secondary | ICD-10-CM | POA: Insufficient documentation

## 2021-08-26 LAB — SARS CORONAVIRUS 2 (TAT 6-24 HRS): SARS Coronavirus 2: NEGATIVE

## 2021-08-28 MED ORDER — HEPARIN SODIUM (PORCINE) 5000 UNIT/ML IJ SOLN: 5000.0000 [IU] | Freq: Once | INTRAMUSCULAR | Status: AC

## 2021-08-28 MED ORDER — GABAPENTIN 300 MG PO CAPS
300.0000 mg | ORAL_CAPSULE | ORAL | Status: AC
  Administered 2021-08-29: 300 mg via ORAL

## 2021-08-28 MED ORDER — CHLORHEXIDINE GLUCONATE CLOTH 2 % EX PADS
6.0000 | MEDICATED_PAD | Freq: Once | CUTANEOUS | Status: AC
  Administered 2021-08-29: 6 via TOPICAL

## 2021-08-28 MED ORDER — SODIUM CHLORIDE 0.9 % IV SOLN: INTRAVENOUS | Status: DC

## 2021-08-28 MED ORDER — CEFAZOLIN SODIUM-DEXTROSE 2-4 GM/100ML-% IV SOLN
2.0000 g | INTRAVENOUS | Status: AC
  Administered 2021-08-29: 2 g via INTRAVENOUS

## 2021-08-28 MED ORDER — ACETAMINOPHEN 500 MG PO TABS: 1000.0000 mg | ORAL_TABLET | ORAL | Status: AC

## 2021-08-28 MED ORDER — CELECOXIB 200 MG PO CAPS: 200.0000 mg | ORAL_CAPSULE | ORAL | Status: AC

## 2021-08-28 MED ORDER — CHLORHEXIDINE GLUCONATE 0.12 % MT SOLN: 15.0000 mL | Freq: Once | OROMUCOSAL | Status: AC

## 2021-08-28 MED ORDER — ORAL CARE MOUTH RINSE: 15.0000 mL | Freq: Once | OROMUCOSAL | Status: AC

## 2021-08-29 ENCOUNTER — Other Ambulatory Visit: Payer: Self-pay

## 2021-08-29 ENCOUNTER — Encounter: Payer: Self-pay | Admitting: Surgery

## 2021-08-29 ENCOUNTER — Ambulatory Visit: Payer: Self-pay | Admitting: Certified Registered Nurse Anesthetist

## 2021-08-29 ENCOUNTER — Observation Stay
Admission: RE | Admit: 2021-08-29 | Discharge: 2021-08-30 | Disposition: A | Discharge status: Home / Self Care | Payer: Self-pay | Attending: Surgery | Admitting: Surgery

## 2021-08-29 ENCOUNTER — Encounter
Admission: RE | Disposition: A | Discharge status: Home / Self Care | Payer: Self-pay | Source: Home / Self Care | Attending: Surgery

## 2021-08-29 DIAGNOSIS — J449 Chronic obstructive pulmonary disease, unspecified: Secondary | ICD-10-CM | POA: Insufficient documentation

## 2021-08-29 DIAGNOSIS — K219 Gastro-esophageal reflux disease without esophagitis: Secondary | ICD-10-CM | POA: Insufficient documentation

## 2021-08-29 DIAGNOSIS — I10 Essential (primary) hypertension: Secondary | ICD-10-CM | POA: Insufficient documentation

## 2021-08-29 DIAGNOSIS — F17210 Nicotine dependence, cigarettes, uncomplicated: Secondary | ICD-10-CM | POA: Insufficient documentation

## 2021-08-29 DIAGNOSIS — Z8719 Personal history of other diseases of the digestive system: Secondary | ICD-10-CM

## 2021-08-29 DIAGNOSIS — J45909 Unspecified asthma, uncomplicated: Secondary | ICD-10-CM | POA: Insufficient documentation

## 2021-08-29 DIAGNOSIS — E119 Type 2 diabetes mellitus without complications: Secondary | ICD-10-CM | POA: Insufficient documentation

## 2021-08-29 DIAGNOSIS — K449 Diaphragmatic hernia without obstruction or gangrene: Principal | ICD-10-CM | POA: Insufficient documentation

## 2021-08-29 HISTORY — PX: INSERTION OF MESH: SHX5868

## 2021-08-29 HISTORY — PX: XI ROBOTIC ASSISTED HIATAL HERNIA REPAIR: SHX6889

## 2021-08-29 LAB — CBC
HCT: 44.4 % (ref 36.0–46.0)
Hemoglobin: 14.5 g/dL (ref 12.0–15.0)
MCH: 30.7 pg (ref 26.0–34.0)
MCHC: 32.7 g/dL (ref 30.0–36.0)
MCV: 94.1 fL (ref 80.0–100.0)
Platelets: 267 10*3/uL (ref 150–400)
RBC: 4.72 MIL/uL (ref 3.87–5.11)
RDW: 12.1 % (ref 11.5–15.5)
WBC: 13.2 10*3/uL — ABNORMAL HIGH (ref 4.0–10.5)
nRBC: 0 % (ref 0.0–0.2)

## 2021-08-29 LAB — HIV ANTIBODY (ROUTINE TESTING W REFLEX): HIV Screen 4th Generation wRfx: NONREACTIVE

## 2021-08-29 LAB — GLUCOSE, CAPILLARY
Glucose-Capillary: 141 mg/dL — ABNORMAL HIGH (ref 70–99)
Glucose-Capillary: 165 mg/dL — ABNORMAL HIGH (ref 70–99)

## 2021-08-29 LAB — CREATININE, SERUM
Creatinine, Ser: 0.75 mg/dL (ref 0.44–1.00)
GFR, Estimated: >60 mL/min (ref >60)

## 2021-08-29 SURGERY — REPAIR, HERNIA, HIATAL, ROBOT-ASSISTED
Anesthesia: General | Site: Abdomen

## 2021-08-29 MED ORDER — FENTANYL CITRATE (PF) 100 MCG/2ML IJ SOLN
INTRAMUSCULAR | Status: AC
  Filled 2021-08-29: qty 2

## 2021-08-29 MED ORDER — LIDOCAINE HCL (CARDIAC) PF 100 MG/5ML IV SOSY
PREFILLED_SYRINGE | INTRAVENOUS | Status: DC | PRN
  Administered 2021-08-29: 80 mg via INTRAVENOUS

## 2021-08-29 MED ORDER — ONDANSETRON HCL 4 MG/2ML IJ SOLN
INTRAMUSCULAR | Status: AC
  Filled 2021-08-29: qty 2

## 2021-08-29 MED ORDER — CEFAZOLIN SODIUM-DEXTROSE 2-4 GM/100ML-% IV SOLN
INTRAVENOUS | Status: AC
  Filled 2021-08-29: qty 100

## 2021-08-29 MED ORDER — 0.9 % SODIUM CHLORIDE (POUR BTL) OPTIME
TOPICAL | Status: DC | PRN
  Administered 2021-08-29: 500 mL

## 2021-08-29 MED ORDER — SODIUM CHLORIDE 0.9 % IR SOLN
Status: DC | PRN
  Administered 2021-08-29: 1000 mL

## 2021-08-29 MED ORDER — EPHEDRINE SULFATE 50 MG/ML IJ SOLN
INTRAMUSCULAR | Status: DC | PRN
Start: 2021-08-29 — End: 2021-08-29
  Administered 2021-08-29 (×3): 5 mg via INTRAVENOUS
  Administered 2021-08-29: 10 mg via INTRAVENOUS

## 2021-08-29 MED ORDER — ONDANSETRON HCL 4 MG/2ML IJ SOLN: 4.0000 mg | Freq: Four times a day (QID) | INTRAMUSCULAR | Status: DC | PRN

## 2021-08-29 MED ORDER — ENOXAPARIN SODIUM 40 MG/0.4ML IJ SOSY
40.0000 mg | PREFILLED_SYRINGE | INTRAMUSCULAR | Status: DC
  Administered 2021-08-30: 40 mg via SUBCUTANEOUS
  Filled 2021-08-29: qty 0.4

## 2021-08-29 MED ORDER — SODIUM CHLORIDE 0.9 % IV SOLN: INTRAVENOUS | Status: DC

## 2021-08-29 MED ORDER — OXYCODONE HCL 5 MG PO TABS
5.0000 mg | ORAL_TABLET | ORAL | Status: DC | PRN
  Administered 2021-08-29 (×2): 10 mg via ORAL
  Filled 2021-08-29: qty 2

## 2021-08-29 MED ORDER — PROPOFOL 500 MG/50ML IV EMUL
INTRAVENOUS | Status: DC | PRN
  Administered 2021-08-29: 30 ug/kg/min via INTRAVENOUS

## 2021-08-29 MED ORDER — BUPIVACAINE-EPINEPHRINE (PF) 0.25% -1:200000 IJ SOLN
INTRAMUSCULAR | Status: DC | PRN
  Administered 2021-08-29: 50 mL via INTRAMUSCULAR

## 2021-08-29 MED ORDER — DIPHENHYDRAMINE HCL 50 MG/ML IJ SOLN: 12.5000 mg | Freq: Four times a day (QID) | INTRAMUSCULAR | Status: DC | PRN

## 2021-08-29 MED ORDER — KETAMINE HCL 50 MG/5ML IJ SOSY
PREFILLED_SYRINGE | INTRAMUSCULAR | Status: AC
  Filled 2021-08-29: qty 5

## 2021-08-29 MED ORDER — PROPOFOL 10 MG/ML IV BOLUS
INTRAVENOUS | Status: DC | PRN
  Administered 2021-08-29: 150 mg via INTRAVENOUS

## 2021-08-29 MED ORDER — BUPIVACAINE LIPOSOME 1.3 % IJ SUSP
INTRAMUSCULAR | Status: AC
  Filled 2021-08-29: qty 20

## 2021-08-29 MED ORDER — ACETAMINOPHEN 500 MG PO TABS
1000.0000 mg | ORAL_TABLET | Freq: Four times a day (QID) | ORAL | Status: DC
  Administered 2021-08-29 – 2021-08-30 (×2): 1000 mg via ORAL
  Filled 2021-08-29 (×2): qty 2

## 2021-08-29 MED ORDER — DIPHENHYDRAMINE HCL 12.5 MG/5ML PO ELIX
12.5000 mg | ORAL_SOLUTION | Freq: Four times a day (QID) | ORAL | Status: DC | PRN
  Filled 2021-08-29: qty 5

## 2021-08-29 MED ORDER — GLIPIZIDE 5 MG PO TABS
5.0000 mg | ORAL_TABLET | Freq: Every day | ORAL | Status: DC
  Administered 2021-08-30: 5 mg via ORAL
  Filled 2021-08-29: qty 1

## 2021-08-29 MED ORDER — GABAPENTIN 400 MG PO CAPS
400.0000 mg | ORAL_CAPSULE | Freq: Three times a day (TID) | ORAL | Status: DC
  Administered 2021-08-29 – 2021-08-30 (×3): 400 mg via ORAL
  Filled 2021-08-29 (×3): qty 1

## 2021-08-29 MED ORDER — FENTANYL CITRATE (PF) 100 MCG/2ML IJ SOLN: 25.0000 ug | INTRAMUSCULAR | Status: DC | PRN

## 2021-08-29 MED ORDER — FENTANYL CITRATE (PF) 100 MCG/2ML IJ SOLN
INTRAMUSCULAR | Status: DC | PRN
  Administered 2021-08-29 (×2): 50 ug via INTRAVENOUS

## 2021-08-29 MED ORDER — VISTASEAL 10 ML SINGLE DOSE KIT
PACK | CUTANEOUS | Status: AC
  Filled 2021-08-29: qty 10

## 2021-08-29 MED ORDER — ALLOPURINOL 100 MG PO TABS
100.0000 mg | ORAL_TABLET | Freq: Every day | ORAL | Status: DC
  Administered 2021-08-29 – 2021-08-30 (×2): 100 mg via ORAL
  Filled 2021-08-29 (×2): qty 1

## 2021-08-29 MED ORDER — ONDANSETRON HCL 4 MG/2ML IJ SOLN
INTRAMUSCULAR | Status: DC | PRN
  Administered 2021-08-29: 4 mg via INTRAVENOUS

## 2021-08-29 MED ORDER — CELECOXIB 200 MG PO CAPS
ORAL_CAPSULE | ORAL | Status: AC
  Administered 2021-08-29: 200 mg via ORAL
  Filled 2021-08-29: qty 1

## 2021-08-29 MED ORDER — GLYCOPYRROLATE 0.2 MG/ML IJ SOLN
INTRAMUSCULAR | Status: AC
  Filled 2021-08-29: qty 1

## 2021-08-29 MED ORDER — MONTELUKAST SODIUM 10 MG PO TABS
5.0000 mg | ORAL_TABLET | Freq: Every day | ORAL | Status: DC
  Administered 2021-08-29: 5 mg via ORAL
  Filled 2021-08-29: qty 1

## 2021-08-29 MED ORDER — ONDANSETRON 4 MG PO TBDP
4.0000 mg | ORAL_TABLET | Freq: Four times a day (QID) | ORAL | Status: DC | PRN
  Administered 2021-08-29: 4 mg via ORAL
  Filled 2021-08-29: qty 1

## 2021-08-29 MED ORDER — CYCLOBENZAPRINE HCL 10 MG PO TABS
5.0000 mg | ORAL_TABLET | Freq: Three times a day (TID) | ORAL | Status: DC | PRN
  Filled 2021-08-29: qty 1

## 2021-08-29 MED ORDER — LEVOTHYROXINE SODIUM 100 MCG PO TABS
100.0000 ug | ORAL_TABLET | Freq: Every day | ORAL | Status: DC
Start: 2021-08-30 — End: 2021-08-30
  Administered 2021-08-30: 100 ug via ORAL
  Filled 2021-08-29: qty 1

## 2021-08-29 MED ORDER — DEXAMETHASONE SODIUM PHOSPHATE 10 MG/ML IJ SOLN
INTRAMUSCULAR | Status: AC
  Filled 2021-08-29: qty 1

## 2021-08-29 MED ORDER — IPRATROPIUM-ALBUTEROL 0.5-2.5 (3) MG/3ML IN SOLN
RESPIRATORY_TRACT | Status: AC
  Filled 2021-08-29: qty 3

## 2021-08-29 MED ORDER — MIDAZOLAM HCL 2 MG/2ML IJ SOLN
INTRAMUSCULAR | Status: AC
  Filled 2021-08-29: qty 2

## 2021-08-29 MED ORDER — PROPOFOL 10 MG/ML IV BOLUS
INTRAVENOUS | Status: AC
  Filled 2021-08-29: qty 20

## 2021-08-29 MED ORDER — OXYCODONE HCL 5 MG PO TABS
ORAL_TABLET | ORAL | Status: AC
  Filled 2021-08-29: qty 1

## 2021-08-29 MED ORDER — ALBUTEROL SULFATE (2.5 MG/3ML) 0.083% IN NEBU: 3.0000 mL | INHALATION_SOLUTION | Freq: Four times a day (QID) | RESPIRATORY_TRACT | Status: DC | PRN

## 2021-08-29 MED ORDER — LIDOCAINE HCL (PF) 2 % IJ SOLN
INTRAMUSCULAR | Status: AC
  Filled 2021-08-29: qty 5

## 2021-08-29 MED ORDER — BUPIVACAINE-EPINEPHRINE (PF) 0.25% -1:200000 IJ SOLN
INTRAMUSCULAR | Status: AC
  Filled 2021-08-29: qty 30

## 2021-08-29 MED ORDER — DEXAMETHASONE SODIUM PHOSPHATE 10 MG/ML IJ SOLN
INTRAMUSCULAR | Status: DC | PRN
  Administered 2021-08-29: 10 mg via INTRAVENOUS

## 2021-08-29 MED ORDER — PHENYLEPHRINE HCL (PRESSORS) 10 MG/ML IV SOLN
INTRAVENOUS | Status: DC | PRN
  Administered 2021-08-29: 80 ug via INTRAVENOUS
  Administered 2021-08-29 (×2): 160 ug via INTRAVENOUS
  Administered 2021-08-29: 80 ug via INTRAVENOUS
  Administered 2021-08-29: 160 ug via INTRAVENOUS

## 2021-08-29 MED ORDER — GABAPENTIN 300 MG PO CAPS
ORAL_CAPSULE | ORAL | Status: AC
  Filled 2021-08-29: qty 1

## 2021-08-29 MED ORDER — KETAMINE HCL 10 MG/ML IJ SOLN
INTRAMUSCULAR | Status: DC | PRN
  Administered 2021-08-29: 30 mg via INTRAVENOUS

## 2021-08-29 MED ORDER — HYDROMORPHONE HCL 1 MG/ML IJ SOLN: 0.5000 mg | INTRAMUSCULAR | Status: DC | PRN

## 2021-08-29 MED ORDER — KETOROLAC TROMETHAMINE 15 MG/ML IJ SOLN
INTRAMUSCULAR | Status: AC
  Filled 2021-08-29: qty 1

## 2021-08-29 MED ORDER — ACETAMINOPHEN 500 MG PO TABS
ORAL_TABLET | ORAL | Status: AC
  Administered 2021-08-29: 1000 mg via ORAL
  Filled 2021-08-29: qty 2

## 2021-08-29 MED ORDER — KETOROLAC TROMETHAMINE 15 MG/ML IJ SOLN
15.0000 mg | Freq: Four times a day (QID) | INTRAMUSCULAR | Status: DC
  Administered 2021-08-29 – 2021-08-30 (×4): 15 mg via INTRAVENOUS
  Filled 2021-08-29 (×3): qty 1

## 2021-08-29 MED ORDER — IPRATROPIUM-ALBUTEROL 0.5-2.5 (3) MG/3ML IN SOLN
3.0000 mL | RESPIRATORY_TRACT | Status: AC
  Administered 2021-08-29: 3 mL via RESPIRATORY_TRACT

## 2021-08-29 MED ORDER — GLYCOPYRROLATE 0.2 MG/ML IJ SOLN
INTRAMUSCULAR | Status: DC | PRN
  Administered 2021-08-29: .1 mg via INTRAVENOUS

## 2021-08-29 MED ORDER — TRAZODONE HCL 50 MG PO TABS
150.0000 mg | ORAL_TABLET | Freq: Every day | ORAL | Status: DC
  Administered 2021-08-29: 150 mg via ORAL
  Filled 2021-08-29: qty 1

## 2021-08-29 MED ORDER — PHENYLEPHRINE HCL-NACL 20-0.9 MG/250ML-% IV SOLN
INTRAVENOUS | Status: AC
  Filled 2021-08-29: qty 250

## 2021-08-29 MED ORDER — HEPARIN SODIUM (PORCINE) 5000 UNIT/ML IJ SOLN
INTRAMUSCULAR | Status: AC
  Administered 2021-08-29: 5000 [IU] via SUBCUTANEOUS
  Filled 2021-08-29: qty 1

## 2021-08-29 MED ORDER — PROPOFOL 500 MG/50ML IV EMUL
INTRAVENOUS | Status: AC
  Filled 2021-08-29: qty 50

## 2021-08-29 MED ORDER — PROCHLORPERAZINE EDISYLATE 10 MG/2ML IJ SOLN: 5.0000 mg | Freq: Four times a day (QID) | INTRAMUSCULAR | Status: DC | PRN

## 2021-08-29 MED ORDER — SUGAMMADEX SODIUM 200 MG/2ML IV SOLN
INTRAVENOUS | Status: DC | PRN
  Administered 2021-08-29: 400 mg via INTRAVENOUS

## 2021-08-29 MED ORDER — VISTASEAL 10 ML SINGLE DOSE KIT
PACK | CUTANEOUS | Status: DC | PRN
  Administered 2021-08-29: 10 mL via TOPICAL

## 2021-08-29 MED ORDER — ROCURONIUM BROMIDE 100 MG/10ML IV SOLN
INTRAVENOUS | Status: DC | PRN
Start: 2021-08-29 — End: 2021-08-29
  Administered 2021-08-29: 20 mg via INTRAVENOUS
  Administered 2021-08-29: 10 mg via INTRAVENOUS
  Administered 2021-08-29: 50 mg via INTRAVENOUS

## 2021-08-29 MED ORDER — PROCHLORPERAZINE MALEATE 10 MG PO TABS
10.0000 mg | ORAL_TABLET | Freq: Four times a day (QID) | ORAL | Status: DC | PRN
  Filled 2021-08-29: qty 1

## 2021-08-29 MED ORDER — CHLORHEXIDINE GLUCONATE 0.12 % MT SOLN
OROMUCOSAL | Status: AC
  Administered 2021-08-29: 15 mL via OROMUCOSAL
  Filled 2021-08-29: qty 15

## 2021-08-29 MED ORDER — ONDANSETRON HCL 4 MG/2ML IJ SOLN: 4.0000 mg | Freq: Once | INTRAMUSCULAR | Status: DC | PRN

## 2021-08-29 MED ORDER — MIDAZOLAM HCL 2 MG/2ML IJ SOLN
INTRAMUSCULAR | Status: DC | PRN
  Administered 2021-08-29: 1 mg via INTRAVENOUS

## 2021-08-29 SURGICAL SUPPLY — 61 items
"PENCIL ELECTRO HAND CTR " (MISCELLANEOUS) ×2 IMPLANT
APPLICATOR VISTASEAL FLEXIBLE (MISCELLANEOUS) ×1 IMPLANT
CANNULA REDUC XI 12-8 STAPL (CANNULA) ×1
CANNULA REDUCER 12-8 DVNC XI (CANNULA) ×2 IMPLANT
CHLORAPREP W/TINT 26 (MISCELLANEOUS) ×3 IMPLANT
DECANTER SPIKE VIAL GLASS SM (MISCELLANEOUS) ×3 IMPLANT
DEFOGGER SCOPE WARMER CLEARIFY (MISCELLANEOUS) ×3 IMPLANT
DERMABOND ADVANCED (GAUZE/BANDAGES/DRESSINGS) ×1
DERMABOND ADVANCED .7 DNX12 (GAUZE/BANDAGES/DRESSINGS) ×2 IMPLANT
DRAPE 3/4 80X56 (DRAPES) ×3 IMPLANT
DRAPE ARM DVNC X/XI (DISPOSABLE) ×8 IMPLANT
DRAPE COLUMN DVNC XI (DISPOSABLE) ×2 IMPLANT
DRAPE DA VINCI XI ARM (DISPOSABLE) ×4
DRAPE DA VINCI XI COLUMN (DISPOSABLE) ×1
ELECT CAUTERY BLADE 6.4 (BLADE) ×3 IMPLANT
ELECT REM PT RETURN 9FT ADLT (ELECTROSURGICAL) ×3
ELECTRODE REM PT RTRN 9FT ADLT (ELECTROSURGICAL) ×2 IMPLANT
GAUZE 4X4 16PLY ~~LOC~~+RFID DBL (SPONGE) ×2 IMPLANT
GLOVE SURG ENC MOIS LTX SZ7 (GLOVE) ×9 IMPLANT
GOWN STRL REUS W/ TWL LRG LVL3 (GOWN DISPOSABLE) ×8 IMPLANT
GOWN STRL REUS W/TWL LRG LVL3 (GOWN DISPOSABLE) ×4
GRASPER LAPSCPC 5X45 DSP (INSTRUMENTS) ×3 IMPLANT
IRRIGATION STRYKERFLOW (MISCELLANEOUS) IMPLANT
IRRIGATOR STRYKERFLOW (MISCELLANEOUS) ×3
IV NS 1000ML (IV SOLUTION) ×1
IV NS 1000ML BAXH (IV SOLUTION) IMPLANT
KIT PINK PAD W/HEAD ARE REST (MISCELLANEOUS) ×3
KIT PINK PAD W/HEAD ARM REST (MISCELLANEOUS) ×2 IMPLANT
KIT TURNOVER CYSTO (KITS) ×3 IMPLANT
LABEL OR SOLS (LABEL) ×3 IMPLANT
MANIFOLD NEPTUNE II (INSTRUMENTS) ×3 IMPLANT
MESH BIO-A 7X10 SYN MAT (Mesh General) ×1 IMPLANT
NEEDLE HYPO 22GX1.5 SAFETY (NEEDLE) ×3 IMPLANT
NS IRRIG 500ML POUR BTL (IV SOLUTION) ×2 IMPLANT
OBTURATOR OPTICAL STANDARD 8MM (TROCAR) ×1
OBTURATOR OPTICAL STND 8 DVNC (TROCAR) ×2
OBTURATOR OPTICALSTD 8 DVNC (TROCAR) ×2 IMPLANT
PACK LAP CHOLECYSTECTOMY (MISCELLANEOUS) ×3 IMPLANT
PENCIL ELECTRO HAND CTR (MISCELLANEOUS) ×3 IMPLANT
SEAL CANN UNIV 5-8 DVNC XI (MISCELLANEOUS) ×6 IMPLANT
SEAL XI 5MM-8MM UNIVERSAL (MISCELLANEOUS) ×3
SEALER VESSEL DA VINCI XI (MISCELLANEOUS) ×1
SEALER VESSEL EXT DVNC XI (MISCELLANEOUS) ×2 IMPLANT
SOLUTION ELECTROLUBE (MISCELLANEOUS) ×3 IMPLANT
SPONGE T-LAP 18X18 ~~LOC~~+RFID (SPONGE) ×3 IMPLANT
STAPLER CANNULA SEAL DVNC XI (STAPLE) ×2 IMPLANT
STAPLER CANNULA SEAL XI (STAPLE) ×1
SUT MNCRL 4-0 (SUTURE) ×2
SUT MNCRL 4-0 27XMFL (SUTURE) ×4
SUT SILK 2 0 SH (SUTURE) ×9 IMPLANT
SUT VICRYL 0 AB UR-6 (SUTURE) ×7 IMPLANT
SUT VLOC 90 S/L VL9 GS22 (SUTURE) ×3 IMPLANT
SUTURE MNCRL 4-0 27XMF (SUTURE) ×2 IMPLANT
SYR 20ML LL LF (SYRINGE) ×3 IMPLANT
SYR 30ML LL (SYRINGE) ×3 IMPLANT
TAPE TRANSPORE STRL 2 31045 (GAUZE/BANDAGES/DRESSINGS) ×3 IMPLANT
TRAY FOLEY SLVR 16FR LF STAT (SET/KITS/TRAYS/PACK) ×3 IMPLANT
TROCAR BALLN GELPORT 12X130M (ENDOMECHANICALS) ×3 IMPLANT
TROCAR XCEL NON-BLD 5MMX100MML (ENDOMECHANICALS) ×3 IMPLANT
TUBING EVAC SMOKE HEATED PNEUM (TUBING) ×3 IMPLANT
WATER STERILE IRR 500ML POUR (IV SOLUTION) ×2 IMPLANT

## 2021-08-29 NOTE — Anesthesia Procedure Notes (Signed)
Procedure Name: Intubation Date/Time: 08/29/2021 11:06 AM Performed by: Hezzie Bump, CRNA Pre-anesthesia Checklist: Patient identified, Patient being monitored, Timeout performed, Emergency Drugs available and Suction available Patient Re-evaluated:Patient Re-evaluated prior to induction Oxygen Delivery Method: Circle system utilized Preoxygenation: Pre-oxygenation with 100% oxygen Induction Type: IV induction Ventilation: Mask ventilation without difficulty Laryngoscope Size: 3 and McGraph Grade View: Grade I Tube type: Oral Tube size: 7.0 mm Number of attempts: 1 Airway Equipment and Method: Stylet and Video-laryngoscopy Placement Confirmation: ETT inserted through vocal cords under direct vision, positive ETCO2 and breath sounds checked- equal and bilateral Secured at: 19 cm Tube secured with: Tape Dental Injury: Teeth and Oropharynx as per pre-operative assessment

## 2021-08-29 NOTE — Interval H&P Note (Signed)
History and Physical Interval Note:  08/29/2021 7:26 AM  Amy Knapp  has presented today for surgery, with the diagnosis of paraesophageal hernia.  The various methods of treatment have been discussed with the patient and family. After consideration of risks, benefits and other options for treatment, the patient has consented to  Procedure(s): XI ROBOTIC ASSISTED HIATAL HERNIA REPAIR, RNFA to assist (N/A) as a surgical intervention.  The patient's history has been reviewed, patient examined, no change in status, stable for surgery.  I have reviewed the patient's chart and labs.  Questions were answered to the patient's satisfaction.     Amy Knapp

## 2021-08-29 NOTE — Op Note (Signed)
Robotic assisted laparoscopic Nissen fundoplication w repair of  hiatal hernia with BioA mesh  Pre-operative Diagnosis: GERD, hiatal hernia  Post-operative Diagnosis: same  Procedure:  Robotic assisted laparoscopic Nissen fundoplication w repair of  hiatal hernia  Surgeon: Sterling Big, MD FACS  Assistant: Rodman Pickle RNFA. Required due to the complexity of the case the need for exposure and   Anesthesia: Gen. with endotracheal tube  Findings: Sliding hiatal hernia Loose wrap 360 degree over 50 FR Bougie   Estimated Blood Loss: 5cc               Complications: none   Procedure Details  The patient was seen again in the Holding Room. The benefits, complications, treatment options, and expected outcomes were discussed with the patient. The risks of bleeding, infection, recurrence of symptoms, failure to resolve symptoms,  esophageal damage, Dysphagia, bowel injury, any of which could require further surgery were reviewed with the patient. The likelihood of improving the patient's symptoms with return to their baseline status is good.  The patient and/or family concurred with the proposed plan, giving informed consent.  The patient was taken to Operating Room, identified  and the procedure verified.  A Time Out was held and the above information confirmed.  Prior to the induction of general anesthesia, antibiotic prophylaxis was administered. VTE prophylaxis was in place. General endotracheal anesthesia was then administered and tolerated well. After the induction, the abdomen was prepped with Chloraprep and draped in the sterile fashion. The patient was positioned in the supine position.  Cut down technique was used to enter the abdominal cavity and a Hasson trochar was placed after two vicryl stitches were anchored to the fascia. Pneumoperitoneum was then created with CO2 and tolerated well without any adverse changes in the patient's vital signs.  Three 8-mm ports were placed  under direct vision. All skin incisions  were infiltrated with a local anesthetic agent before making the incision and placing the trocars. An additional 5 mm regular laparoscopic port was placed to assist with retraction and exposure.   The patient was positioned  in reverse Trendelenburg, robot was brought to the surgical field and docked in the standard fashion.  We made sure all the instrumentation was kept indirect view at all times and that there were no collision between the arms. I scrubbed out and went to the console.  I used a robotic arm to retract the liver, the vessel sealer on my right hand and a forced bipolar grasper on my left hand.  There is along the extra 5 mm port allow me ample exposure and the ability to perform meticulous dissection  We Started dividing the lesser omentum via the pars flaccida.  We Were able to dissect the lesser curvature of the stomach and  dissected the fundus free from the right and left crus.  We circumferentially dissected the GE junction.  The hernia sac was also completely reduced and we were able to bring the stomach into the intra-abdominal position.  Attention then was turned to the greater curvature where the short gastrics were divided with sealer device.  We were able to identify the left crus and again were able to make sure there was a good circumferential dissection and that the hernia sac was completely excised.  We did perform a good dissection within the mediastinum to allow a complete reduction of the sac and a to completely allow an intra-abdominal Nissen fundoplication.  2-0V lock suture was inserted and the crus  closed with  a running suture using a strip of BIo-A as a pledget. Mesh was placed posterior to the diaphragm and secured using vista seal.  We Asked anesthesia to place a 50 French bougie and this went easily.  We also observe trajectory of the bougie. 360 degree Nissen fundoplication was created with multiple 2-0 silk sutures and  we placed 3 stitches taking some of the esophagus within that bite.  The fundoplication measured approximately 3-1/2 cm and he was floppy. I was very happy with the way the fundoplication laid and the repair of the hernia.  Inspection of the  upper quadrant was performed. No bleeding, bile  Or esophageal injuries leaks, or bowel injuries were noted. Robotic instruments and robotic arms were undocked in the standard fashion. All the needles were removed under direct visualization.   I scrubbed back in.  Pneumoperitoneum was released.  The periumbilical port site was closed with interrumpted 0 Vicryl sutures. 4-0 subcuticular Monocryl was used to close the skin. Liposomal marcaine was injected to all the incisions sites.  Dermabond was  applied.  The patient was then extubated and brought to the recovery room in stable condition. Sponge, lap, and needle counts were correct at closure and at the conclusion of the case.               Sterling Big, MD, FACS

## 2021-08-29 NOTE — Anesthesia Postprocedure Evaluation (Signed)
Anesthesia Post Note  Patient: Amy Knapp  Procedure(s) Performed: XI ROBOTIC ASSISTED HIATAL HERNIA REPAIR, RNFA to assist (Abdomen)  Patient location during evaluation: PACU Anesthesia Type: General Level of consciousness: awake and alert, awake and oriented Pain management: pain level controlled Vital Signs Assessment: post-procedure vital signs reviewed and stable Respiratory status: spontaneous breathing, nonlabored ventilation and respiratory function stable Cardiovascular status: blood pressure returned to baseline and stable Postop Assessment: no apparent nausea or vomiting Anesthetic complications: no   No notable events documented.   Last Vitals:  Vitals:   08/29/21 1410 08/29/21 1415  BP:  129/77  Pulse:  76  Resp:  13  Temp:    SpO2: (!) 88% 93%    Last Pain:  Vitals:   08/29/21 1415  TempSrc:   PainSc: 0-No pain                 Manfred Arch

## 2021-08-29 NOTE — Anesthesia Preprocedure Evaluation (Signed)
Anesthesia Evaluation  Patient identified by MRN, date of birth, ID band Patient awake    Reviewed: Allergy & Precautions, NPO status , Patient's Chart, lab work & pertinent test results  History of Anesthesia Complications Negative for: history of anesthetic complications  Airway Mallampati: II  TM Distance: >3 FB Neck ROM: Full    Dental  (+) Poor Dentition, Missing, Upper Dentures   Pulmonary asthma , sleep apnea (No CPAP) , COPD,  COPD inhaler, Current SmokerPatient did not abstain from smoking.,    breath sounds clear to auscultation- rhonchi (-) wheezing      Cardiovascular hypertension, Pt. on medications (-) CAD, (-) Past MI, (-) Cardiac Stents and (-) CABG  Rhythm:Regular Rate:Normal - Systolic murmurs and - Diastolic murmurs    Neuro/Psych neg Seizures PSYCHIATRIC DISORDERS Anxiety Depression negative neurological ROS     GI/Hepatic Neg liver ROS, hiatal hernia, GERD  ,  Endo/Other  diabetes, Oral Hypoglycemic AgentsHypothyroidism   Renal/GU negative Renal ROS     Musculoskeletal  (+) Arthritis ,   Abdominal (+) + obese,   Peds  Hematology negative hematology ROS (+)   Anesthesia Other Findings Allergy    Anxiety    Arthritis    Asthma    Broken leg 2007 Right  Cataract    Complex renal cyst 06/24/2021   COPD (chronic obstructive pulmonary disease) (HCC)    Depression    Diabetes mellitus without complication (HCC)  Emphysema of lung (HCC)    Essential hypertension 04/20/2021   GERD (gastroesophageal reflux disease)   Gout    History of hiatal hernia    Hyperlipidemia    Hypothyroidism    Sleep apnea    Thyroid disease       Reproductive/Obstetrics                            Anesthesia Physical  Anesthesia Plan  ASA: 3  Anesthesia Plan: General   Post-op Pain Management:    Induction: Intravenous  PONV Risk Score and Plan: 3 and Propofol infusion,  Ondansetron and Midazolam  Airway Management Planned: Oral ETT  Additional Equipment:   Intra-op Plan:   Post-operative Plan: Extubation in OR  Informed Consent: I have reviewed the patients History and Physical, chart, labs and discussed the procedure including the risks, benefits and alternatives for the proposed anesthesia with the patient or authorized representative who has indicated his/her understanding and acceptance.     Dental advisory given  Plan Discussed with: CRNA, Anesthesiologist and Surgeon  Anesthesia Plan Comments:        Anesthesia Quick Evaluation

## 2021-08-29 NOTE — Transfer of Care (Signed)
Immediate Anesthesia Transfer of Care Note  Patient: Amy Knapp  Procedure(s) Performed: XI ROBOTIC ASSISTED HIATAL HERNIA REPAIR, RNFA to assist (Abdomen)  Patient Location: PACU  Anesthesia Type:General  Level of Consciousness: drowsy  Airway & Oxygen Therapy: Patient Spontanous Breathing and Patient connected to face mask oxygen  Post-op Assessment: Report given to RN and Post -op Vital signs reviewed and stable  Post vital signs: Reviewed and stable  Last Vitals:  Vitals Value Taken Time  BP 111/53 08/29/21 1307  Temp 36.3 C 08/29/21 1307  Pulse 92 08/29/21 1309  Resp 17 08/29/21 1309  SpO2 98 % 08/29/21 1309  Vitals shown include unvalidated device data.  Last Pain:  Vitals:   08/29/21 1307  TempSrc:   PainSc: Asleep         Complications: No notable events documented.

## 2021-08-30 ENCOUNTER — Encounter: Payer: Self-pay | Admitting: Surgery

## 2021-08-30 ENCOUNTER — Other Ambulatory Visit: Payer: Self-pay

## 2021-08-30 LAB — BASIC METABOLIC PANEL
Anion gap: 4 — ABNORMAL LOW (ref 5–15)
BUN: 12 mg/dL (ref 8–23)
CO2: 28 mmol/L (ref 22–32)
Calcium: 9 mg/dL (ref 8.9–10.3)
Chloride: 105 mmol/L (ref 98–111)
Creatinine, Ser: 0.81 mg/dL (ref 0.44–1.00)
GFR, Estimated: >60 mL/min (ref >60)
Glucose, Bld: 171 mg/dL — ABNORMAL HIGH (ref 70–99)
Potassium: 4.7 mmol/L (ref 3.5–5.1)
Sodium: 137 mmol/L (ref 135–145)

## 2021-08-30 LAB — CBC
HCT: 41.1 % (ref 36.0–46.0)
Hemoglobin: 13.5 g/dL (ref 12.0–15.0)
MCH: 30.8 pg (ref 26.0–34.0)
MCHC: 32.8 g/dL (ref 30.0–36.0)
MCV: 93.6 fL (ref 80.0–100.0)
Platelets: 249 10*3/uL (ref 150–400)
RBC: 4.39 MIL/uL (ref 3.87–5.11)
RDW: 12 % (ref 11.5–15.5)
WBC: 12.6 10*3/uL — ABNORMAL HIGH (ref 4.0–10.5)
nRBC: 0 % (ref 0.0–0.2)

## 2021-08-30 MED ORDER — OXYCODONE HCL 5 MG PO TABS: 5.0000 mg | ORAL_TABLET | ORAL | 0 refills | Status: DC | PRN

## 2021-08-30 MED ORDER — CYCLOBENZAPRINE HCL 5 MG PO TABS
5.0000 mg | ORAL_TABLET | Freq: Three times a day (TID) | ORAL | 0 refills | Status: DC | PRN
  Filled 2021-08-30: qty 20, 7d supply, fill #0

## 2021-08-30 MED ORDER — IBUPROFEN 600 MG PO TABS
600.0000 mg | ORAL_TABLET | Freq: Four times a day (QID) | ORAL | 0 refills | Status: DC | PRN
  Filled 2021-08-30: qty 30, 8d supply, fill #0

## 2021-08-30 NOTE — Plan of Care (Signed)
  Problem: Clinical Measurements: Goal: Ability to maintain clinical measurements within normal limits will improve Outcome: Progressing Goal: Will remain free from infection Outcome: Progressing Goal: Diagnostic test results will improve Outcome: Progressing Goal: Respiratory complications will improve Outcome: Progressing Goal: Cardiovascular complication will be avoided Outcome: Progressing   Problem: Pain Managment: Goal: General experience of comfort will improve Outcome: Progressing   Pt is involved in and agrees with the plan of care. V/S stable. Has some pain on her surgical sites; has scheduled tylenol and toradol. Tolerates full liquid diet. Has pass gas 1x. Denies nausea. Voiding well.

## 2021-08-30 NOTE — Discharge Instructions (Signed)
In addition to included general post-operative instructions,  Diet: Recommend following Nissen diet for 4 weeks. You were provided handouts on this.   Activity: No heavy lifting >20 pounds (children, pets, laundry, garbage) or strenuous activity for 4 weeks, but light activity and walking are encouraged. Do not drive or drink alcohol if taking narcotic pain medications or having pain that might distract from driving.  Wound care: 2 days after surgery (11/24), you may shower/get incision wet with soapy water and pat dry (do not rub incisions), but no baths or submerging incision underwater until follow-up. Do not put any creams of lotions on your incisions.   Medications: Resume all home medications. For mild to moderate pain: acetaminophen (Tylenol) or ibuprofen/naproxen (if no kidney disease). Combining Tylenol with alcohol can substantially increase your risk of causing liver disease. Narcotic pain medications, if prescribed, can be used for severe pain, though may cause nausea, constipation, and drowsiness. Do not combine Tylenol and Percocet (or similar) within a 6 hour period as Percocet (and similar) contain(s) Tylenol. If you do not need the narcotic pain medication, you do not need to fill the prescription.  Call office [phone] / [phone]) at any time if any questions, worsening pain, fevers/chills, bleeding, drainage from incision site, or other concerns.

## 2021-08-30 NOTE — Progress Notes (Signed)
PIV's removed. Discharge instructions completed. Patient verbalized understanding of medication regimen, follow up appointments and discharge instructions. Patient belongings gathered and packed to discharge. Waiting for ride to arrive.

## 2021-08-30 NOTE — Discharge Summary (Signed)
[facility] SURGICAL DISCHARGE SUMMARY  Patient ID: Amy Knapp MRN: [id number] DOB/AGE: [birth year] 63 y.o.  Admit date: 08/29/2021 Discharge date: 08/30/2021  Discharge Diagnoses Patient Active Problem List   Diagnosis Date Noted   S/P repair of paraesophageal hernia 08/29/2021    Consultants None  Procedures 08/29/2021:  Robotic assisted laparoscopic paraesophageal hernia repair and nissen fundoplication  HPI: Amy Knapp is a 65 y.o. female with a history of hiatal hernia and reflux symptoms who presents to [facility] on 11/22 for scheduled paraesophageal hernia repair with Dr Dahlia Byes.   Hospital Course: Informed consent was obtained and documented, and patient underwent uneventful robotic assisted laparoscopic paraesophageal hernia repair and Nissen fundoplication (Dr Dahlia Byes, [id number]/0865).  Post-operatively, patient did well. Advancement of patient's diet and ambulation were well-tolerated. The remainder of patient's hospital course was essentially unremarkable, and discharge planning was initiated accordingly with patient safely able to be discharged home with appropriate discharge instructions, pain control, and outpatient follow-up after all of her questions were answered to her expressed satisfaction.   Discharge Condition: Good   Physical Examination:  Constitutional: Well appearing female, NAD Pulmonary: Normal effort, no respiratory distress Gastrointestinal: Soft, non-tender, non-distended, no rebound/guarding Skin: Laparoscopic incisions are CDI with dermabond, no erythema or drainage    Allergies as of 08/30/2021       Reactions   Celery Oil    itching   Demerol [meperidine]    Burning, feels hot   Morphine And Related Other (See Comments)   Muscle Spasms   Penicillins    blisters        Medication List     STOP taking these medications    omeprazole 20 MG capsule Commonly known as: PRILOSEC       TAKE these  medications    albuterol 108 (90 Base) MCG/ACT inhaler Commonly known as: VENTOLIN HFA INHALE 2 PUFFS INTO THE LUNGS EVERY 6 HOURS AS NEEDED FOR WHEEZING OR SHORT OF BREATH   allopurinol 100 MG tablet Commonly known as: ZYLOPRIM TAKE ONE TABLET BY MOUTH EVERY DAY   atorvastatin 10 MG tablet Commonly known as: LIPITOR TAKE ONE TABLET BY MOUTH EVERY DAY   cyclobenzaprine 5 MG tablet Commonly known as: FLEXERIL Take 1 tablet (5 mg total) by mouth 3 (three) times daily as needed for muscle spasms.   gabapentin 400 MG capsule Commonly known as: NEURONTIN Take 1 capsule (400 mg total) by mouth 3 (three) times daily.   glipiZIDE 5 MG tablet Commonly known as: GLUCOTROL Take 5 mg by mouth daily before breakfast.   ibuprofen 600 MG tablet Commonly known as: ADVIL Take 1 tablet (600 mg total) by mouth every 6 (six) hours as needed for mild pain or moderate pain. What changed: reasons to take this   levothyroxine 100 MCG tablet Commonly known as: SYNTHROID TAKE ONE TABLET BY MOUTH 45 MINUTES BEFORE BREAKFAST.   losartan 25 MG tablet Commonly known as: COZAAR Take 1 tablet (25 mg total) by mouth once daily.   montelukast 10 MG tablet Commonly known as: SINGULAIR TAKE ONE TABLET BY MOUTH ONCE DAILY AT BEDTIME   oxyCODONE 5 MG immediate release tablet Commonly known as: Oxy IR/ROXICODONE Take 1-2 tablets (5-10 mg total) by mouth every 4 (four) hours as needed for moderate pain.   Rightest GM550 Blood Glucose w/Device Kit Dispense based on patient and insurance preference. Use up to four times daily as directed. (FOR ICD-9 250.00, 250.01).   traZODone 50 MG tablet Commonly known as: DESYREL Take 3  tablets (150 mg total) by mouth once daily at bedtime.   Trelegy Ellipta 100-62.5-25 MCG/ACT Aepb Generic drug: Fluticasone-Umeclidin-Vilant INHALE 1 PUFF INTO THE LUNGS EVERY DAY          Follow-up Information     Pabon, [place] F, MD Follow up in 2 week(s).   Specialty:  General Surgery Contact information: [address] [place] [address] [phone]                  Time spent on discharge management including discussion of hospital course, clinical condition, outpatient instructions, prescriptions, and follow up with the patient and members of the medical team: >30 minutes  -- Edison Simon , PA-C [facility]  08/30/2021, 8:58 AM [phone] M-F: 7am - 4pm

## 2021-09-05 ENCOUNTER — Encounter: Payer: Self-pay | Admitting: Surgery

## 2021-09-07 ENCOUNTER — Ambulatory Visit: Payer: Self-pay | Admitting: Gerontology

## 2021-09-07 ENCOUNTER — Other Ambulatory Visit: Payer: Self-pay

## 2021-09-07 VITALS — BP 131/80 | HR 77 | Temp 98.8°F | Ht 60.6 in | Wt 167.0 lb

## 2021-09-07 DIAGNOSIS — E039 Hypothyroidism, unspecified: Secondary | ICD-10-CM

## 2021-09-07 DIAGNOSIS — K449 Diaphragmatic hernia without obstruction or gangrene: Secondary | ICD-10-CM

## 2021-09-07 DIAGNOSIS — E1140 Type 2 diabetes mellitus with diabetic neuropathy, unspecified: Secondary | ICD-10-CM

## 2021-09-07 DIAGNOSIS — E785 Hyperlipidemia, unspecified: Secondary | ICD-10-CM

## 2021-09-07 DIAGNOSIS — I10 Essential (primary) hypertension: Secondary | ICD-10-CM

## 2021-09-07 MED ORDER — GABAPENTIN 400 MG PO CAPS
400.0000 mg | ORAL_CAPSULE | Freq: Three times a day (TID) | ORAL | 0 refills | Status: DC
  Filled 2021-09-07: qty 90, 30d supply, fill #0

## 2021-09-07 MED ORDER — ATORVASTATIN CALCIUM 10 MG PO TABS
ORAL_TABLET | Freq: Every day | ORAL | 2 refills | Status: DC
  Filled 2021-09-07: qty 30, fill #0
  Filled 2021-09-25: qty 30, 30d supply, fill #0
  Filled 2021-10-31: qty 30, 30d supply, fill #1

## 2021-09-07 MED ORDER — LEVOTHYROXINE SODIUM 100 MCG PO TABS
ORAL_TABLET | ORAL | 3 refills | Status: DC
  Filled 2021-09-07: qty 60, fill #0
  Filled 2021-09-25: qty 30, 30d supply, fill #0
  Filled 2021-10-31: qty 30, 30d supply, fill #1
  Filled 2021-11-29: qty 30, 30d supply, fill #2
  Filled 2022-01-01: qty 30, 30d supply, fill #3

## 2021-09-07 MED ORDER — LOSARTAN POTASSIUM 25 MG PO TABS
25.0000 mg | ORAL_TABLET | Freq: Every day | ORAL | 2 refills | Status: DC
  Filled 2021-09-07 – 2021-09-25 (×2): qty 30, 30d supply, fill #0
  Filled 2021-10-31: qty 30, 30d supply, fill #1
  Filled 2021-11-29: qty 30, 30d supply, fill #2

## 2021-09-07 NOTE — Progress Notes (Signed)
Established Patient Office Visit  Subjective:  Patient ID: Amy Knapp, female    DOB: [birth year]  Age: 65 y.o. MRN: [id number]  CC:  Chief Complaint  Patient presents with   Follow-up    Had hernia surgery 08/29/21, surgical follow up scheduled 09/11/21.   Diabetes    Blood glucose  =128 mg/dL this morning    HPI Amy Knapp is a 65 y/o female who has history of Allergy, Anxiety, Arthritis, Asthma, COPD, Depression, Diabetes presents for routine follow up visit and medication refill. She had hernia repair on 08/29/21 by Dr Dahlia Byes, and the 5 [facility] sites are intact, no erythema, drainage, but experiences mild pain. She states that she checks her fasting blood glucose daily and it's usually less than 130 mg/dl. She's compliant with her medication, denies side effects and continues to make healthy lifestyle changes.  Past Medical History:  Diagnosis Date   Allergy    Anxiety    Arthritis    Asthma    Broken leg 2007   Right   Cataract    Complex renal cyst 06/24/2021   COPD (chronic obstructive pulmonary disease) (HCC)    Depression    Diabetes mellitus without complication (HCC)    Emphysema of lung ([place])    Essential hypertension 04/20/2021   GERD (gastroesophageal reflux disease)    Gout    History of hiatal hernia    Hyperlipidemia    Hypothyroidism    Sleep apnea    Thyroid disease     Past Surgical History:  Procedure Laterality Date   ABDOMINAL HYSTERECTOMY  2000   Partial   COLONOSCOPY WITH PROPOFOL N/A 04/11/2021   Procedure: COLONOSCOPY WITH PROPOFOL;  Surgeon: Lucilla Lame, MD;  Location: [facility] ENDOSCOPY;  Service: Endoscopy;  Laterality: N/A;   ESOPHAGOGASTRODUODENOSCOPY N/A 04/11/2021   Procedure: ESOPHAGOGASTRODUODENOSCOPY (EGD);  Surgeon: Lucilla Lame, MD;  Location: [facility] ENDOSCOPY;  Service: Endoscopy;  Laterality: N/A;   INSERTION OF MESH  08/29/2021   Procedure: INSERTION OF MESH;  Surgeon: Jules Husbands, MD;  Location: [facility]  ORS;  Service: General;;   LEG SURGERY Right 10/08/2005   broken right lower leg; plate and screws   PATELLA FRACTURE SURGERY Right    TUBAL LIGATION     XI ROBOTIC ASSISTED HIATAL HERNIA REPAIR N/A 08/29/2021   Procedure: XI ROBOTIC ASSISTED HIATAL HERNIA REPAIR, RNFA to assist;  Surgeon: Jules Husbands, MD;  Location: [facility] ORS;  Service: General;  Laterality: N/A;    Family History  Problem Relation Age of Onset   Cancer Mother    Heart attack Father    Heart attack Sister    Cancer Brother    Diabetes Daughter    Diabetes Son    Hypertension Maternal Aunt    Breast cancer Neg Hx     Social History   Socioeconomic History   Marital status: Divorced    Spouse name: Not on file   Number of children: 4   Years of education: Not on file   Highest education level: High school graduate  Occupational History   Occupation: Unemployed  Tobacco Use   Smoking status: Every Day    Packs/day: 0.15    Types: Cigarettes   Smokeless tobacco: Former    Quit date: 06/06/2021  Vaping Use   Vaping Use: Never used  Substance and Sexual Activity   Alcohol use: No   Drug use: No   Sexual activity: Not Currently  Other Topics Concern   Not on file  Social History Narrative   Not on file   Social Determinants of Health   Financial Resource Strain: Low Risk    Difficulty of Paying Living Expenses: Not hard at all  Food Insecurity: No Food Insecurity   Worried About Charity fundraiser in the Last Year: Never true   Arboriculturist in the Last Year: Never true  Transportation Needs: No Transportation Needs   Lack of Transportation (Medical): No   Lack of Transportation (Non-Medical): No  Physical Activity: Sufficiently Active   Days of Exercise per Week: 7 days   Minutes of Exercise per Session: 150+ min  Stress: Stress Concern Present   Feeling of Stress : Rather much  Social Connections: Moderately Isolated   Frequency of Communication with Friends and Family: More than three  times a week   Frequency of Social Gatherings with Friends and Family: Once a week   Attends Religious Services: More than 4 times per year   Active Member of [employer] or Organizations: No   Attends Archivist Meetings: Never   Marital Status: Widowed  Human resources officer Violence: Not At Risk   Fear of Current or Ex-Partner: No   Emotionally Abused: No   Physically Abused: No   Sexually Abused: No    Outpatient Medications Prior to Visit  Medication Sig Dispense Refill   albuterol (VENTOLIN HFA) 108 (90 Base) MCG/ACT inhaler INHALE 2 PUFFS INTO THE LUNGS EVERY 6 HOURS AS NEEDED FOR WHEEZING OR SHORT OF BREATH 54 g 2   allopurinol (ZYLOPRIM) 100 MG tablet TAKE ONE TABLET BY MOUTH EVERY DAY 90 tablet 1   blood glucose meter kit and supplies KIT Dispense based on patient and insurance preference. Use up to four times daily as directed. (FOR ICD-9 250.00, 250.01). 1 each 0   cyclobenzaprine (FLEXERIL) 5 MG tablet Take 1 tablet (5 mg total) by mouth 3 (three) times daily as needed for muscle spasms. 20 tablet 0   Fluticasone-Umeclidin-Vilant 100-62.5-25 MCG/INH AEPB INHALE 1 PUFF INTO THE LUNGS EVERY DAY 60 each 3   ibuprofen (ADVIL) 600 MG tablet Take 1 tablet (600 mg total) by mouth every 6 (six) hours as needed for mild pain or moderate pain. 30 tablet 0   montelukast (SINGULAIR) 10 MG tablet TAKE ONE TABLET BY MOUTH ONCE DAILY AT BEDTIME 30 tablet 1   oxyCODONE (OXY IR/ROXICODONE) 5 MG immediate release tablet Take 1-2 tablets (5-10 mg total) by mouth every 4 (four) hours as needed for moderate pain. 30 tablet 0   traZODone (DESYREL) 50 MG tablet Take 3 tablets (150 mg total) by mouth once daily at bedtime. 90 tablet 0   atorvastatin (LIPITOR) 10 MG tablet TAKE ONE TABLET BY MOUTH EVERY DAY 30 tablet 2   gabapentin (NEURONTIN) 400 MG capsule Take 1 capsule (400 mg total) by mouth 3 (three) times daily. 270 capsule 0   levothyroxine (SYNTHROID) 100 MCG tablet TAKE ONE TABLET BY MOUTH 45  MINUTES BEFORE BREAKFAST. 60 tablet 3   losartan (COZAAR) 25 MG tablet Take 1 tablet (25 mg total) by mouth once daily. 30 tablet 2   glipiZIDE (GLUCOTROL) 5 MG tablet Take 5 mg by mouth daily before breakfast. (Patient not taking: Reported on 08/29/2021)     No facility-administered medications prior to visit.    Allergies  Allergen Reactions   Celery Oil     itching   Demerol [Meperidine]     Burning, feels hot   Morphine And Related Other (See Comments)  Muscle Spasms   Penicillins     blisters    ROS Review of Systems  Constitutional: Negative.   Eyes: Negative.   Respiratory: Negative.    Cardiovascular: Negative.   Gastrointestinal:  Positive for abdominal pain (s/p hernia repair).  Skin:        Demabond sites  Neurological: Negative.      Objective:    Physical Exam HENT:     Head: Normocephalic and atraumatic.     Mouth/Throat:     Mouth: Mucous membranes are moist.  Eyes:     Extraocular Movements: Extraocular movements intact.     Conjunctiva/sclera: Conjunctivae normal.     Pupils: Pupils are equal, round, and reactive to light.  Cardiovascular:     Rate and Rhythm: Normal rate and regular rhythm.     Pulses: Normal pulses.     Heart sounds: Normal heart sounds.  Pulmonary:     Effort: Pulmonary effort is normal.     Breath sounds: Normal breath sounds.  Skin:    Findings: No erythema.     Comments: 5 dermabond sites to abdomen, no erythema, drainage, but mild pain to abdomen with palpation  Neurological:     General: No focal deficit present.     Mental Status: She is alert and oriented to person, place, and time. Mental status is at baseline.  Psychiatric:        Mood and Affect: Mood normal.        Behavior: Behavior normal.        Thought Content: Thought content normal.        Judgment: Judgment normal.    BP 131/80   Pulse 77   Temp 98.8 F (37.1 C) (Oral)   Ht 5' 0.6" (1.539 m)   Wt 167 lb (75.8 kg)   SpO2 92%   BMI 31.97 kg/m   Wt Readings from Last 3 Encounters:  09/07/21 167 lb (75.8 kg)  08/29/21 141 lb (64 kg)  08/18/21 160 lb (72.6 kg)   Encouraged weight loss  Health Maintenance Due  Topic Date Due   COVID-19 Vaccine (1) Never done   Pneumococcal Vaccine 32-51 Years old (1 - PCV) Never done   OPHTHALMOLOGY EXAM  Never done   Hepatitis C Screening  Never done   TETANUS/TDAP  Never done   Zoster Vaccines- Shingrix (1 of 2) Never done   PAP SMEAR-Modifier  Never done    There are no preventive care reminders to display for this patient.  Lab Results  Component Value Date   TSH 1.870 03/16/2021   Lab Results  Component Value Date   WBC 12.6 (H) 08/30/2021   HGB 13.5 08/30/2021   HCT 41.1 08/30/2021   MCV 93.6 08/30/2021   PLT 249 08/30/2021   Lab Results  Component Value Date   NA 137 08/30/2021   K 4.7 08/30/2021   CO2 28 08/30/2021   GLUCOSE 171 (H) 08/30/2021   BUN 12 08/30/2021   CREATININE 0.81 08/30/2021   BILITOT 0.3 07/28/2020   ALKPHOS 108 07/28/2020   AST 28 07/28/2020   ALT 37 (H) 07/28/2020   PROT 6.8 07/28/2020   ALBUMIN 4.4 07/28/2020   CALCIUM 9.0 08/30/2021   ANIONGAP 4 (L) 08/30/2021   Lab Results  Component Value Date   CHOL 179 07/28/2020   Lab Results  Component Value Date   HDL 46 07/28/2020   Lab Results  Component Value Date   LDLCALC 99 07/28/2020   Lab Results  Component Value Date   TRIG 199 (H) 07/28/2020   Lab Results  Component Value Date   CHOLHDL 4.0 03/16/2020   Lab Results  Component Value Date   HGBA1C 7.2 (H) 06/22/2021      Assessment & Plan:     1. Hyperlipidemia, unspecified hyperlipidemia type - She will continue on current medication, low fat/cholesterol diet  - atorvastatin (LIPITOR) 10 MG tablet; TAKE ONE TABLET BY MOUTH ONCE EVERY DAY.  Dispense: 30 tablet; Refill: 2  2. Type 2 diabetes mellitus with diabetic neuropathy, without long-term current use of insulin (HCC) - Her peripheral neuropathy is under control  with taking gabapentin. - gabapentin (NEURONTIN) 400 MG capsule; Take 1 capsule (400 mg total) by mouth 3 (three) times daily.  Dispense: 270 capsule; Refill: 0  3. Hypothyroidism, unspecified type - Her TSH is euthyroid and will continue current medication - levothyroxine (SYNTHROID) 100 MCG tablet; TAKE ONE TABLET BY MOUTH ONCE DAILY 45 MINUTES BEFORE BREAKFAST.  Dispense: 60 tablet; Refill: 3  4. Essential hypertension - Her blood pressure is improving, will continue on current medication, DASH diet and exercise as tolerated. - losartan (COZAAR) 25 MG tablet; Take 1 tablet (25 mg total) by mouth once daily.  Dispense: 30 tablet; Refill: 2  5. Hiatal hernia - She will follow up with General surgery next week, and was advised to go to the ED and notify Surgeon for worsening symptoms.    Follow-up: Return in about 8 weeks (around 11/02/2021), or if symptoms worsen or fail to improve.    Love Chowning Jerold Coombe, NP

## 2021-09-07 NOTE — Patient Instructions (Signed)
DASH Eating Plan °DASH stands for Dietary Approaches to Stop Hypertension. The DASH eating plan is a healthy eating plan that has been shown to: °Reduce high blood pressure (hypertension). °Reduce your risk for type 2 diabetes, heart disease, and stroke. °Help with weight loss. °What are tips for following this plan? °Reading food labels °Check food labels for the amount of salt (sodium) per serving. Choose foods with less than 5 percent of the Daily Value of sodium. Generally, foods with less than 300 milligrams (mg) of sodium per serving fit into this eating plan. °To find whole grains, look for the word "whole" as the first word in the ingredient list. °Shopping °Buy products labeled as "low-sodium" or "no salt added." °Buy fresh foods. Avoid canned foods and pre-made or frozen meals. °Cooking °Avoid adding salt when cooking. Use salt-free seasonings or herbs instead of table salt or sea salt. Check with your health care provider or pharmacist before using salt substitutes. °Do not fry foods. Cook foods using healthy methods such as baking, boiling, grilling, roasting, and broiling instead. °Cook with heart-healthy oils, such as olive, canola, avocado, soybean, or sunflower oil. °Meal planning ° °Eat a balanced diet that includes: °4 or more servings of fruits and 4 or more servings of vegetables each day. Try to fill one-half of your plate with fruits and vegetables. °6-8 servings of whole grains each day. °Less than 6 oz (170 g) of lean meat, poultry, or fish each day. A 3-oz (85-g) serving of meat is about the same size as a deck of cards. One egg equals 1 oz (28 g). °2-3 servings of low-fat dairy each day. One serving is 1 cup (237 mL). °1 serving of nuts, seeds, or beans 5 times each week. °2-3 servings of heart-healthy fats. Healthy fats called omega-3 fatty acids are found in foods such as walnuts, flaxseeds, fortified milks, and eggs. These fats are also found in cold-water fish, such as sardines, salmon,  and mackerel. °Limit how much you eat of: °Canned or prepackaged foods. °Food that is high in trans fat, such as some fried foods. °Food that is high in saturated fat, such as fatty meat. °Desserts and other sweets, sugary drinks, and other foods with added sugar. °Full-fat dairy products. °Do not salt foods before eating. °Do not eat more than 4 egg yolks a week. °Try to eat at least 2 vegetarian meals a week. °Eat more home-cooked food and less restaurant, buffet, and fast food. °Lifestyle °When eating at a restaurant, ask that your food be prepared with less salt or no salt, if possible. °If you drink alcohol: °Limit how much you use to: °0-1 drink a day for women who are not pregnant. °0-2 drinks a day for men. °Be aware of how much alcohol is in your drink. In the [place] one drink equals one 12 oz bottle of beer (355 mL), one 5 oz glass of wine (148 mL), or one 1½ oz glass of hard liquor (44 mL). °General information °Avoid eating more than 2,300 mg of salt a day. If you have hypertension, you may need to reduce your sodium intake to 1,500 mg a day. °Work with your health care provider to maintain a healthy body weight or to lose weight. Ask what an ideal weight is for you. °Get at least 30 minutes of exercise that causes your heart to beat faster (aerobic exercise) most days of the week. Activities may include walking, swimming, or biking. °Work with your health care provider or dietitian to   adjust your eating plan to your individual calorie needs. °What foods should I eat? °Fruits °All fresh, dried, or frozen fruit. Canned fruit in natural juice (without added sugar). °Vegetables °Fresh or frozen vegetables (raw, steamed, roasted, or grilled). Low-sodium or reduced-sodium tomato and vegetable juice. Low-sodium or reduced-sodium tomato sauce and tomato paste. Low-sodium or reduced-sodium canned vegetables. °Grains °Whole-grain or whole-wheat bread. Whole-grain or whole-wheat pasta. Brown rice. Oatmeal. Quinoa.  Bulgur. Whole-grain and low-sodium cereals. Pita bread. Low-fat, low-sodium crackers. Whole-wheat flour tortillas. °Meats and other proteins °Skinless chicken or turkey. Ground chicken or turkey. Pork with fat trimmed off. Fish and seafood. Egg whites. Dried beans, peas, or lentils. Unsalted nuts, nut butters, and seeds. Unsalted canned beans. Lean cuts of beef with fat trimmed off. Low-sodium, lean precooked or cured meat, such as sausages or meat loaves. °Dairy °Low-fat (1%) or fat-free (skim) milk. Reduced-fat, low-fat, or fat-free cheeses. Nonfat, low-sodium ricotta or cottage cheese. Low-fat or nonfat yogurt. Low-fat, low-sodium cheese. °Fats and oils °Soft margarine without trans fats. Vegetable oil. Reduced-fat, low-fat, or light mayonnaise and salad dressings (reduced-sodium). Canola, safflower, olive, avocado, soybean, and sunflower oils. Avocado. °Seasonings and condiments °Herbs. Spices. Seasoning mixes without salt. °Other foods °Unsalted popcorn and pretzels. Fat-free sweets. °The items listed above may not be a complete list of foods and beverages you can eat. Contact a dietitian for more information. °What foods should I avoid? °Fruits °Canned fruit in a light or heavy syrup. Fried fruit. Fruit in cream or butter sauce. °Vegetables °Creamed or fried vegetables. Vegetables in a cheese sauce. Regular canned vegetables (not low-sodium or reduced-sodium). Regular canned tomato sauce and paste (not low-sodium or reduced-sodium). Regular tomato and vegetable juice (not low-sodium or reduced-sodium). Pickles. Olives. °Grains °Baked goods made with fat, such as croissants, muffins, or some breads. Dry pasta or rice meal packs. °Meats and other proteins °Fatty cuts of meat. Ribs. Fried meat. Bacon. Bologna, salami, and other precooked or cured meats, such as sausages or meat loaves. Fat from the back of a pig (fatback). Bratwurst. Salted nuts and seeds. Canned beans with added salt. Canned or smoked fish.  Whole eggs or egg yolks. Chicken or turkey with skin. °Dairy °Whole or 2% milk, cream, and half-and-half. Whole or full-fat cream cheese. Whole-fat or sweetened yogurt. Full-fat cheese. Nondairy creamers. Whipped toppings. Processed cheese and cheese spreads. °Fats and oils °Butter. Stick margarine. Lard. Shortening. Ghee. Bacon fat. Tropical oils, such as coconut, palm kernel, or palm oil. °Seasonings and condiments °Onion salt, garlic salt, seasoned salt, table salt, and sea salt. Worcestershire sauce. Tartar sauce. Barbecue sauce. Teriyaki sauce. Soy sauce, including reduced-sodium. Steak sauce. Canned and packaged gravies. Fish sauce. Oyster sauce. Cocktail sauce. Store-bought horseradish. Ketchup. Mustard. Meat flavorings and tenderizers. Bouillon cubes. Hot sauces. Pre-made or packaged marinades. Pre-made or packaged taco seasonings. Relishes. Regular salad dressings. °Other foods °Salted popcorn and pretzels. °The items listed above may not be a complete list of foods and beverages you should avoid. Contact a dietitian for more information. °Where to find more information °National Heart, Lung, and Blood Institute: www.nhlbi.nih.gov °American Heart Association: www.heart.org °Academy of Nutrition and Dietetics: www.eatright.org °National Kidney Foundation: www.kidney.org °Summary °The DASH eating plan is a healthy eating plan that has been shown to reduce high blood pressure (hypertension). It may also reduce your risk for type 2 diabetes, heart disease, and stroke. °When on the DASH eating plan, aim to eat more fresh fruits and vegetables, whole grains, lean proteins, low-fat dairy, and heart-healthy fats. °With the DASH   eating plan, you should limit salt (sodium) intake to 2,300 mg a day. If you have hypertension, you may need to reduce your sodium intake to 1,500 mg a day. °Work with your health care provider or dietitian to adjust your eating plan to your individual calorie needs. °This information is not  intended to replace advice given to you by your health care provider. Make sure you discuss any questions you have with your health care provider. °Document Revised: 08/28/2019 Document Reviewed: 08/28/2019 °Elsevier Patient Education © 2022 [employer] °Carbohydrate Counting for Diabetes Mellitus, Adult °Carbohydrate counting is a method of keeping track of how many carbohydrates you eat. Eating carbohydrates increases the amount of sugar (glucose) in the blood. Counting how many carbohydrates you eat improves how well you manage your blood glucose. This, in turn, helps you manage your diabetes. °Carbohydrates are measured in grams (g) per serving. It is important to know how many carbohydrates (in grams or by serving size) you can have in each meal. This is different for every person. A dietitian can help you make a meal plan and calculate how many carbohydrates you should have at each meal and snack. °What foods contain carbohydrates? °Carbohydrates are found in the following foods: °Grains, such as breads and cereals. °Dried beans and soy products. °Starchy vegetables, such as potatoes, peas, and corn. °Fruit and fruit juices. °Milk and yogurt. °Sweets and snack foods, such as cake, cookies, candy, chips, and soft drinks. °How do I count carbohydrates in foods? °There are two ways to count carbohydrates in food. You can read food labels or learn standard serving sizes of foods. You can use either of these methods or a combination of both. °Using the Nutrition Facts label °The Nutrition Facts list is included on the labels of almost all packaged foods and beverages in the [place]. It includes: °The serving size. °Information about nutrients in each serving, including the grams of carbohydrate per serving. °To use the Nutrition Facts, decide how many servings you will have. Then, multiply the number of servings by the number of carbohydrates per serving. The resulting number is the total grams of  carbohydrates that you will be having. °Learning the standard serving sizes of foods °When you eat carbohydrate foods that are not packaged or do not include Nutrition Facts on the label, you need to measure the servings in order to count the grams of carbohydrates. °Measure the foods that you will eat with a food scale or measuring cup, if needed. °Decide how many standard-size servings you will eat. °Multiply the number of servings by 15. For foods that contain carbohydrates, one serving equals 15 g of carbohydrates. °For example, if you eat 2 cups or 10 oz (300 g) of strawberries, you will have eaten 2 servings and 30 g of carbohydrates (2 servings x 15 g = 30 g). °For foods that have more than one food mixed, such as soups and casseroles, you must count the carbohydrates in each food that is included. °The following list contains standard serving sizes of common carbohydrate-rich foods. Each of these servings has about 15 g of carbohydrates: °1 slice of bread. °1 six-inch (15 cm) tortilla. °? cup or 2 oz (53 g) cooked rice or pasta. °½ cup or 3 oz (85 g) cooked or canned, drained and rinsed beans or lentils. °½ cup or 3 oz (85 g) starchy vegetable, such as peas, corn, or squash. °½ cup or 4 oz (120 g) hot cereal. °½ cup or 3 oz (85   g) boiled or mashed potatoes, or ¼ or 3 oz (85 g) of a large baked potato. °½ cup or 4 fl oz (118 mL) fruit juice. °1 cup or 8 fl oz (237 mL) milk. °1 small or 4 oz (106 g) apple. °½ or 2 oz (63 g) of a medium banana. °1 cup or 5 oz (150 g) strawberries. °3 cups or 1 oz (28.3 g) popped popcorn. °What is an example of carbohydrate counting? °To calculate the grams of carbohydrates in this sample meal, follow the steps shown below. °Sample meal °3 oz (85 g) chicken breast. °? cup or 4 oz (106 g) brown rice. °½ cup or 3 oz (85 g) corn. °1 cup or 8 fl oz (237 mL) milk. °1 cup or 5 oz (150 g) strawberries with sugar-free whipped topping. °Carbohydrate calculation °Identify the foods that  contain carbohydrates: °Rice. °Corn. °Milk. °Strawberries. °Calculate how many servings you have of each food: °2 servings rice. °1 serving corn. °1 serving milk. °1 serving strawberries. °Multiply each number of servings by 15 g: °2 servings rice x 15 g = 30 g. °1 serving corn x 15 g = 15 g. °1 serving milk x 15 g = 15 g. °1 serving strawberries x 15 g = 15 g. °Add together all of the amounts to find the total grams of carbohydrates eaten: °30 g + 15 g + 15 g + 15 g = 75 g of carbohydrates total. °What are tips for following this plan? °Shopping °Develop a meal plan and then make a shopping list. °Buy fresh and frozen vegetables, fresh and frozen fruit, dairy, eggs, beans, lentils, and whole grains. °Look at food labels. Choose foods that have more fiber and less sugar. °Avoid processed foods and foods with added sugars. °Meal planning °Aim to have the same number of grams of carbohydrates at each meal and for each snack time. °Plan to have regular, balanced meals and snacks. °Where to find more information °American Diabetes Association: diabetes.org °Centers for Disease Control and Prevention: cdc.gov °Academy of Nutrition and Dietetics: eatright.org °Association of Diabetes Care & Education Specialists: diabeteseducator.org °Summary °Carbohydrate counting is a method of keeping track of how many carbohydrates you eat. °Eating carbohydrates increases the amount of sugar (glucose) in your blood. °Counting how many carbohydrates you eat improves how well you manage your blood glucose. This helps you manage your diabetes. °A dietitian can help you make a meal plan and calculate how many carbohydrates you should have at each meal and snack. °This information is not intended to replace advice given to you by your health care provider. Make sure you discuss any questions you have with your health care provider. °Document Revised: 04/27/2020 Document Reviewed: 04/27/2020 °Elsevier Patient Education © 2022 Elsevier  Inc. ° °

## 2021-09-08 ENCOUNTER — Other Ambulatory Visit: Payer: Self-pay

## 2021-09-09 ENCOUNTER — Ambulatory Visit: Payer: Self-pay

## 2021-09-11 ENCOUNTER — Ambulatory Visit (INDEPENDENT_AMBULATORY_CARE_PROVIDER_SITE_OTHER): Payer: Self-pay | Admitting: Surgery

## 2021-09-11 ENCOUNTER — Other Ambulatory Visit: Payer: Self-pay

## 2021-09-11 ENCOUNTER — Encounter: Payer: Self-pay | Admitting: Surgery

## 2021-09-11 VITALS — BP 117/79 | HR 71 | Temp 98.5°F | Ht 60.5 in | Wt 169.0 lb

## 2021-09-11 DIAGNOSIS — K449 Diaphragmatic hernia without obstruction or gangrene: Secondary | ICD-10-CM

## 2021-09-11 DIAGNOSIS — Z09 Encounter for follow-up examination after completed treatment for conditions other than malignant neoplasm: Secondary | ICD-10-CM

## 2021-09-11 NOTE — Patient Instructions (Addendum)
If you have any concerns or questions, please feel free to contact our office.   GENERAL POST-OPERATIVE PATIENT INSTRUCTIONS   WOUND CARE INSTRUCTIONS:  Keep a dry clean dressing on the wound if there is drainage. The initial bandage may be removed after 24 hours.  Once the wound has quit draining you may leave it open to air.  If clothing rubs against the wound or causes irritation and the wound is not draining you may cover it with a dry dressing during the daytime.  Try to keep the wound dry and avoid ointments on the wound unless directed to do so.  If the wound becomes bright red and painful or starts to drain infected material that is not clear, please contact your physician immediately.  If the wound is mildly pink and has a thick firm ridge underneath it, this is normal, and is referred to as a healing ridge.  This will resolve over the next 4-6 weeks.  BATHING: You may shower if you have been informed of this by your surgeon. However, Please do not submerge in a tub, hot tub, or pool until incisions are completely sealed or have been told by your surgeon that you may do so.  DIET:  You may eat any foods that you can tolerate.  It is a good idea to eat a high fiber diet and take in plenty of fluids to prevent constipation.  If you do become constipated you may want to take a mild laxative or take ducolax tablets on a daily basis until your bowel habits are regular.  Constipation can be very uncomfortable, along with straining, after recent surgery.  ACTIVITY:  You are encouraged to cough and deep breath or use your incentive spirometer if you were given one, every 15-30 minutes when awake.  This will help prevent respiratory complications and low grade fevers post-operatively if you had a general anesthetic.  You may want to hug a pillow when coughing and sneezing to add additional support to the surgical area, if you had abdominal or chest surgery, which will decrease pain during these times.   You are encouraged to walk and engage in light activity for the next two weeks.  You should not lift, push, or pull more than 10-15 pounds, until 10/10/2021 as it could put you at increased risk for complications.  Twenty pounds is roughly equivalent to a plastic bag of groceries. At that time- Listen to your body when lifting, if you have pain when lifting, stop and then try again in a few days. Soreness after doing exercises or activities of daily living is normal as you get back in to your normal routine.  MEDICATIONS:  Try to take narcotic medications and anti-inflammatory medications, such as tylenol, ibuprofen, naprosyn, etc., with food.  This will minimize stomach upset from the medication.  Should you develop nausea and vomiting from the pain medication, or develop a rash, please discontinue the medication and contact your physician.  You should not drive, make important decisions, or operate machinery when taking narcotic pain medication.  SUNBLOCK Use sun block to incision area over the next year if this area will be exposed to sun. This helps decrease scarring and will allow you avoid a permanent darkened area over your incision.

## 2021-09-12 NOTE — Progress Notes (Signed)
Amy Knapp is 2 weeks out after robotic paraesophageal hernia repair.  She is doing very well.  Swallowing well.  No dysphagia no reflux No fevers or chills  PE NAD ABd: sopft, nt, incisions c/d/I  A/P doing very well without complications related to recent paraesophageal hernia repair. We will follow her up in a couple months. Continue Nissen diet

## 2021-09-20 ENCOUNTER — Ambulatory Visit
Admission: RE | Admit: 2021-09-20 | Discharge: 2021-09-20 | Disposition: A | Discharge status: Home / Self Care | Payer: Self-pay | Source: Ambulatory Visit | Attending: Urology | Admitting: Urology

## 2021-09-20 ENCOUNTER — Other Ambulatory Visit: Payer: Self-pay

## 2021-09-20 DIAGNOSIS — N281 Cyst of kidney, acquired: Secondary | ICD-10-CM | POA: Insufficient documentation

## 2021-09-20 MED ORDER — GADOBUTROL 1 MMOL/ML IV SOLN
7.5000 mL | Freq: Once | INTRAVENOUS | Status: AC | PRN
  Administered 2021-09-20: 09:00:00 7.5 mL via INTRAVENOUS

## 2021-09-21 ENCOUNTER — Telehealth: Payer: Self-pay

## 2021-09-21 NOTE — Telephone Encounter (Signed)
Patient returned call and was notified of results

## 2021-09-21 NOTE — Telephone Encounter (Signed)
-----   Message from Sondra Come, MD sent at 09/21/2021  8:20 AM EST ----- Good news, MRI shows only simple cysts of the kidneys that do not require further follow-up or imaging.  Can follow-up with urology as needed  Legrand Rams, MD 09/21/2021

## 2021-09-21 NOTE — Telephone Encounter (Signed)
Called pt no answer. Left detailed message for pt informing her of the information below. Advised pt to call back for questions or concerns.  

## 2021-09-25 ENCOUNTER — Other Ambulatory Visit: Payer: Self-pay

## 2021-10-10 ENCOUNTER — Telehealth: Payer: Self-pay | Admitting: *Deleted

## 2021-10-10 NOTE — Telephone Encounter (Signed)
Patient called and would like to come by this afternoon to pick up a return to work note with no restrictions, she has already return to work but it needs to say no restrictions. Patient had surgery on 08/29/21 Dr Everlene Farrier Hiatal Hernia.

## 2021-10-17 NOTE — Progress Notes (Signed)
Letter mailed from [facility] to notify of normal mammogram results.  Patient to return in one year for annual screening.  Copy to [facility]. 

## 2021-10-20 ENCOUNTER — Other Ambulatory Visit: Payer: Self-pay

## 2021-10-31 ENCOUNTER — Other Ambulatory Visit: Payer: Self-pay | Admitting: Gerontology

## 2021-10-31 ENCOUNTER — Other Ambulatory Visit: Payer: Self-pay

## 2021-10-31 DIAGNOSIS — J441 Chronic obstructive pulmonary disease with (acute) exacerbation: Secondary | ICD-10-CM

## 2021-10-31 DIAGNOSIS — G4700 Insomnia, unspecified: Secondary | ICD-10-CM

## 2021-10-31 DIAGNOSIS — J449 Chronic obstructive pulmonary disease, unspecified: Secondary | ICD-10-CM

## 2021-10-31 MED FILL — Trazodone HCl Tab 50 MG: ORAL | 30 days supply | Qty: 90 | Fill #0 | Status: AC

## 2021-10-31 MED FILL — Montelukast Sodium Tab 10 MG (Base Equiv): ORAL | 30 days supply | Qty: 30 | Fill #0 | Status: AC

## 2021-10-31 MED FILL — Albuterol Sulfate Inhal Aero 108 MCG/ACT (90MCG Base Equiv): RESPIRATORY_TRACT | 25 days supply | Qty: 6.7 | Fill #0 | Status: AC

## 2021-11-02 ENCOUNTER — Ambulatory Visit: Payer: Self-pay | Admitting: Gerontology

## 2021-11-02 ENCOUNTER — Other Ambulatory Visit: Payer: Self-pay

## 2021-11-02 VITALS — BP 134/83 | HR 78 | Temp 97.5°F | Resp 16 | Ht 60.5 in | Wt 161.0 lb

## 2021-11-02 DIAGNOSIS — M109 Gout, unspecified: Secondary | ICD-10-CM

## 2021-11-02 DIAGNOSIS — E1140 Type 2 diabetes mellitus with diabetic neuropathy, unspecified: Secondary | ICD-10-CM

## 2021-11-02 DIAGNOSIS — E785 Hyperlipidemia, unspecified: Secondary | ICD-10-CM

## 2021-11-02 DIAGNOSIS — R079 Chest pain, unspecified: Secondary | ICD-10-CM | POA: Insufficient documentation

## 2021-11-02 DIAGNOSIS — I10 Essential (primary) hypertension: Secondary | ICD-10-CM

## 2021-11-02 LAB — POCT GLYCOSYLATED HEMOGLOBIN (HGB A1C): Hemoglobin A1C: 6.7 % — AB (ref 4.0–5.6)

## 2021-11-02 LAB — GLUCOSE, POCT (MANUAL RESULT ENTRY): POC Glucose: 86 mg/dl (ref 70–99)

## 2021-11-02 MED ORDER — ALLOPURINOL 100 MG PO TABS
ORAL_TABLET | Freq: Every day | ORAL | 1 refills | Status: DC
  Filled 2021-11-02: qty 90, fill #0
  Filled 2022-01-01 – 2022-03-08 (×2): qty 90, 90d supply, fill #0

## 2021-11-02 MED ORDER — ATORVASTATIN CALCIUM 10 MG PO TABS
ORAL_TABLET | Freq: Every day | ORAL | 2 refills | Status: DC
  Filled 2021-11-02: qty 30, fill #0
  Filled 2021-11-29: qty 90, 90d supply, fill #0

## 2021-11-02 NOTE — Progress Notes (Signed)
Established Patient Office Visit  Subjective:  Patient ID: Amy Knapp, female    DOB: March 28, 1956  Age: 66 y.o. MRN: [id number]  CC:  Chief Complaint  Patient presents with   Follow-up   Hypertension    Patient has been checking her bp. She states the highest 186/148 on 10/27/21. Patient does state that  she was out of HTN meds from 10/27/21 to 10/31/21.    HPIThelma Janiesha Knapp  is a 66 y/o female who has history of Allergy, Anxiety, Arthritis, Asthma, COPD, Depression, Diabetes presents for routine follow up visit and medication refill. She states that she's compliant with her medications, continues to make healthy lifestyle changes. She c/o experiencing intermittent sharp 8/10 left chest pain some times at rest that  radiates to her left shoulder and arm. She states that pain resolves in 1-1 1/2 minutes. She states that it started  11 months ago, both it's frequency has increased in the past 2 months to every other day. She states that she might experience 2-3 episodes in a day. She states that left chest pain is associated with shortness of breath, nausea, light headedness, blurry vision but denies vomiting. She states that she experienced 2 episodes of chest pain today, which resolved, she states it might be related to stress. Her HgbA1c decreased from 7.2% to 6.7%, she self discontinued her 61m Glipizide, and checks her fasting blood glucose which is less than 110 mg/dl. She denies hypo/hyperglycemic symptoms, performs daily foot checks and her peripheral neuropathy is improving with taking gabapentin. She also c/o experiencing mild  intermittent abdominal pain with lifting  heavy objects at work. She was s/p robotic paraesophageal hernia repair that was done on 08/30/21. Overall she states that she's doing well and offers no further complaint.     Past Medical History:  Diagnosis Date   Allergy    Anxiety    Arthritis    Asthma    Broken leg 2007   Right   Cataract     Complex renal cyst 06/24/2021   COPD (chronic obstructive pulmonary disease) (HCC)    Depression    Diabetes mellitus without complication (HCC)    [place]    Essential hypertension 04/20/2021   GERD (gastroesophageal reflux disease)    Gout    History of hiatal hernia    Hyperlipidemia    Hypothyroidism    Sleep apnea    Thyroid disease     Past Surgical History:  Procedure Laterality Date   ABDOMINAL HYSTERECTOMY  2000   Partial   COLONOSCOPY WITH PROPOFOL N/A 04/11/2021   Procedure: COLONOSCOPY WITH PROPOFOL;  Surgeon: WLucilla Lame MD;  Location: [facility] ENDOSCOPY;  Service: Endoscopy;  Laterality: N/A;   ESOPHAGOGASTRODUODENOSCOPY N/A 04/11/2021   Procedure: ESOPHAGOGASTRODUODENOSCOPY (EGD);  Surgeon: WLucilla Lame MD;  [facility];  Service: Endoscopy;  Laterality: N/A;   INSERTION OF MESH  08/29/2021   Procedure: INSERTION OF MESH;  Surgeon: PJules Husbands MD;  Location: [facility] ORS;  Service: General;;   LEG SURGERY Right 10/08/2005   broken right lower leg; plate and screws   PATELLA FRACTURE SURGERY Right    TUBAL LIGATION     XI ROBOTIC ASSISTED HIATAL HERNIA REPAIR N/A 08/29/2021   Procedure: XI ROBOTIC APomeroy RNFA to assist;  Surgeon: PJules Husbands MD;  Location: [facility] ORS;  Service: General;  Laterality: N/A;    Family History  Problem Relation Age of Onset   Cancer Mother  Heart attack Father    Cancer Brother        colon   Heart attack Brother 32   Diabetes Daughter    Diabetes Son    Hypertension Maternal Aunt    Breast cancer Neg Hx     Social History   Socioeconomic History   Marital status: Divorced    Spouse name: Not on file   Number of children: 4   Years of education: Not on file   Highest education level: High school graduate  Occupational History   Occupation: Unemployed  Tobacco Use   Smoking status: Every Day    Packs/day: 0.15    Types: Cigarettes   Smokeless tobacco: Former     Quit date: 1973   Tobacco comments:    Smoking 2 cigarettes/day    Patient dipped snuff and used smokeless tobacco when she was very young. Patient stopped at age 40.  Vaping Use   Vaping Use: Never used  Substance and Sexual Activity   Alcohol use: No   Drug use: No   Sexual activity: Not Currently  Other Topics Concern   Not on file  Social History Narrative   Not on file   Social Determinants of Health   Financial Resource Strain: Low Risk    Difficulty of Paying Living Expenses: Not hard at all  Food Insecurity: No Food Insecurity   Worried About Charity fundraiser in the Last Year: Never true   [place] in the Last Year: Never true  Transportation Needs: No Transportation Needs   Lack of Transportation (Medical): No   Lack of Transportation (Non-Medical): No  Physical Activity: Sufficiently Active   Days of Exercise per Week: 7 days   Minutes of Exercise per Session: 150+ min  Stress: Stress Concern Present   Feeling of Stress : Rather much  Social Connections: Moderately Isolated   Frequency of Communication with Friends and Family: More than three times a week   Frequency of Social Gatherings with Friends and Family: Once a week   Attends Religious Services: More than 4 times per year   Active Member of [employer] or Organizations: No   Attends Archivist Meetings: Never   Marital Status: Widowed  Human resources officer Violence: Not At Risk   Fear of Current or Ex-Partner: No   Emotionally Abused: No   Physically Abused: No   Sexually Abused: No    Outpatient Medications Prior to Visit  Medication Sig Dispense Refill   albuterol (PROVENTIL HFA) 108 (90 Base) MCG/ACT inhaler INHALE 2 PUFFS INTO THE LUNGS ONCE EVERY 6 HOURS AS NEEDED FOR WHEEZING OR SHORT OF BREATH. 20.1 g 2   blood glucose meter kit and supplies KIT Dispense based on patient and insurance preference. Use up to four times daily as directed. (FOR ICD-9 250.00, 250.01). 1 each 0    Fluticasone-Umeclidin-Vilant 100-62.5-25 MCG/INH AEPB INHALE 1 PUFF INTO THE LUNGS EVERY DAY 60 each 3   gabapentin (NEURONTIN) 400 MG capsule Take 1 capsule (400 mg total) by mouth 3 (three) times daily. 270 capsule 0   ibuprofen (ADVIL) 600 MG tablet Take 1 tablet (600 mg total) by mouth every 6 (six) hours as needed for mild pain or moderate pain. 30 tablet 0   levothyroxine (SYNTHROID) 100 MCG tablet TAKE ONE TABLET BY MOUTH ONCE DAILY 45 MINUTES BEFORE BREAKFAST. 60 tablet 3   losartan (COZAAR) 25 MG tablet Take 1 tablet (25 mg total) by mouth once daily. 30 tablet 2  montelukast (SINGULAIR) 10 MG tablet TAKE ONE TABLET BY MOUTH ONCE DAILY AT BEDTIME 30 tablet 1   oxyCODONE (OXY IR/ROXICODONE) 5 MG immediate release tablet Take 1-2 tablets (5-10 mg total) by mouth every 4 (four) hours as needed for moderate pain. 30 tablet 0   traZODone (DESYREL) 50 MG tablet Take 3 tablets (150 mg total) by mouth once daily at bedtime. 90 tablet 0   allopurinol (ZYLOPRIM) 100 MG tablet TAKE ONE TABLET BY MOUTH EVERY DAY 90 tablet 1   atorvastatin (LIPITOR) 10 MG tablet TAKE ONE TABLET BY MOUTH ONCE EVERY DAY. 30 tablet 2   cyclobenzaprine (FLEXERIL) 5 MG tablet Take 1 tablet (5 mg total) by mouth 3 (three) times daily as needed for muscle spasms. (Patient not taking: Reported on 11/02/2021) 20 tablet 0   glipiZIDE (GLUCOTROL) 5 MG tablet Take 5 mg by mouth daily before breakfast. (Patient not taking: Reported on 11/02/2021)     No facility-administered medications prior to visit.    Allergies  Allergen Reactions   Celery Oil     itching   Demerol [Meperidine]     Burning, feels hot   Morphine And Related Other (See Comments)    Muscle Spasms   Penicillins     blisters    ROS Review of Systems  Constitutional: Negative.   Eyes: Negative.   Respiratory: Negative.    Cardiovascular: Negative.   Gastrointestinal:  Positive for abdominal pain (intermittent with lifting heavy objects at work).   Endocrine: Negative.   Skin: Negative.   Neurological: Negative.   Psychiatric/Behavioral: Negative.       Objective:    Physical Exam HENT:     Head: Normocephalic and atraumatic.     Mouth/Throat:     Mouth: Mucous membranes are moist.  Eyes:     Extraocular Movements: Extraocular movements intact.     Conjunctiva/sclera: Conjunctivae normal.     Pupils: Pupils are equal, round, and reactive to light.  Cardiovascular:     Rate and Rhythm: Normal rate and regular rhythm.     Pulses: Normal pulses.     Heart sounds: Normal heart sounds.  Pulmonary:     Effort: Pulmonary effort is normal.     Breath sounds: Normal breath sounds.  Abdominal:     General: Bowel sounds are normal.     Palpations: Abdomen is soft. There is no mass.     Tenderness: There is no abdominal tenderness.     Hernia: No hernia is present.  Skin:    General: Skin is warm.  Neurological:     General: No focal deficit present.     Mental Status: She is alert and oriented to person, place, and time. Mental status is at baseline.  Psychiatric:        Mood and Affect: Mood normal.        Behavior: Behavior normal.        Thought Content: Thought content normal.        Judgment: Judgment normal.    BP 134/83 (BP Location: Right Arm, Patient Position: Sitting, Cuff Size: Large)    Pulse 78    Temp (!) 97.5 F (36.4 C) (Oral)    Resp 16    Ht 5' 0.5" (1.537 m)    Wt 161 lb (73 kg)    SpO2 93%    BMI 30.93 kg/m  Wt Readings from Last 3 Encounters:  11/02/21 161 lb (73 kg)  09/11/21 169 lb (76.7 kg)  09/07/21 167 lb (  75.8 kg)   Encouraged weight loss  Health Maintenance Due  Topic Date Due   COVID-19 Vaccine (1) Never done   OPHTHALMOLOGY EXAM  Never done   Hepatitis C Screening  Never done   TETANUS/TDAP  Never done   Zoster Vaccines- Shingrix (1 of 2) Never done   PAP SMEAR-Modifier  Never done    There are no preventive care reminders to display for this patient.  Lab Results  Component  Value Date   TSH 1.870 03/16/2021   Lab Results  Component Value Date   WBC 12.6 (H) 08/30/2021   HGB 13.5 08/30/2021   HCT 41.1 08/30/2021   MCV 93.6 08/30/2021   PLT 249 08/30/2021   Lab Results  Component Value Date   NA 137 08/30/2021   K 4.7 08/30/2021   CO2 28 08/30/2021   GLUCOSE 171 (H) 08/30/2021   BUN 12 08/30/2021   CREATININE 0.81 08/30/2021   BILITOT 0.3 07/28/2020   ALKPHOS 108 07/28/2020   AST 28 07/28/2020   ALT 37 (H) 07/28/2020   PROT 6.8 07/28/2020   ALBUMIN 4.4 07/28/2020   CALCIUM 9.0 08/30/2021   ANIONGAP 4 (L) 08/30/2021   Lab Results  Component Value Date   CHOL 179 07/28/2020   Lab Results  Component Value Date   HDL 46 07/28/2020   Lab Results  Component Value Date   LDLCALC 99 07/28/2020   Lab Results  Component Value Date   TRIG 199 (H) 07/28/2020   Lab Results  Component Value Date   CHOLHDL 4.0 03/16/2020   Lab Results  Component Value Date   HGBA1C 6.7 (A) 11/02/2021      Assessment & Plan:   1. Type 2 diabetes mellitus with diabetic neuropathy, without long-term current use of insulin (HCC) - Her HgbA1c was 6.7%, her glipizide was discontinued since she stopped taking it. She was encouraged to continue on low carb/non concentrated sweet diet. - POCT HgB A1C; Future - POCT Glucose (CBG); Future - POCT Glucose (CBG) - POCT HgB A1C  2. Gout of multiple sites, unspecified cause, unspecified chronicity - Her gout is under control. She will continue current medication. - allopurinol (ZYLOPRIM) 100 MG tablet; TAKE ONE TABLET BY MOUTH EVERY DAY  Dispense: 90 tablet; Refill: 1  3. Hyperlipidemia, unspecified hyperlipidemia type - She will continue current medication, low fat/cholesterol diet. - atorvastatin (LIPITOR) 10 MG tablet; TAKE ONE TABLET BY MOUTH ONCE EVERY DAY.  Dispense: 30 tablet; Refill: 2  4. Essential hypertension - Her blood pressure is under control and she will continue current medication, DASH  diet.  5. Left-sided chest pain - Will check EKG and was advised to go to the ED for worsening symptoms. - EKG 12-Lead      Follow-up: Return in about 13 weeks (around 02/01/2022), or if symptoms worsen or fail to improve.    Riot Barrick Jerold Coombe, NP

## 2021-11-02 NOTE — Patient Instructions (Signed)

## 2021-11-03 ENCOUNTER — Other Ambulatory Visit: Payer: Self-pay

## 2021-11-07 ENCOUNTER — Other Ambulatory Visit: Payer: Self-pay | Admitting: Gerontology

## 2021-11-14 ENCOUNTER — Other Ambulatory Visit: Payer: Self-pay

## 2021-11-14 ENCOUNTER — Other Ambulatory Visit: Payer: Self-pay | Admitting: Gerontology

## 2021-11-14 DIAGNOSIS — R079 Chest pain, unspecified: Secondary | ICD-10-CM

## 2021-11-14 NOTE — Progress Notes (Signed)
Ms Amy Knapp EKG done at the clinic showed sinus arrhythmia, she was advised to call and schedule outpatient EKG. She was advised to go to the ED for chest pain.

## 2021-11-23 ENCOUNTER — Ambulatory Visit
Admission: RE | Admit: 2021-11-23 | Discharge: 2021-11-23 | Disposition: A | Discharge status: Home / Self Care | Payer: Self-pay | Source: Ambulatory Visit | Attending: Gerontology | Admitting: Gerontology

## 2021-11-23 ENCOUNTER — Other Ambulatory Visit: Payer: Self-pay

## 2021-11-23 DIAGNOSIS — R079 Chest pain, unspecified: Secondary | ICD-10-CM | POA: Insufficient documentation

## 2021-11-29 ENCOUNTER — Other Ambulatory Visit: Payer: Self-pay

## 2021-11-29 ENCOUNTER — Other Ambulatory Visit: Payer: Self-pay | Admitting: Gerontology

## 2021-11-29 DIAGNOSIS — G4700 Insomnia, unspecified: Secondary | ICD-10-CM

## 2021-11-29 MED FILL — Montelukast Sodium Tab 10 MG (Base Equiv): ORAL | 30 days supply | Qty: 30 | Fill #1 | Status: AC

## 2021-11-29 MED FILL — Albuterol Sulfate Inhal Aero 108 MCG/ACT (90MCG Base Equiv): RESPIRATORY_TRACT | 25 days supply | Qty: 6.7 | Fill #1 | Status: AC

## 2021-11-30 ENCOUNTER — Other Ambulatory Visit: Payer: Self-pay

## 2021-11-30 MED FILL — Trazodone HCl Tab 50 MG: ORAL | 30 days supply | Qty: 90 | Fill #0 | Status: AC

## 2021-12-21 ENCOUNTER — Telehealth: Payer: Self-pay

## 2021-12-21 NOTE — Telephone Encounter (Signed)
Ms. Patricelli has been called 3 times to reschedule her missed appointment with Herbert Seta. Voicemail's have been left. Will not attempt to call again.  ?

## 2022-01-01 ENCOUNTER — Other Ambulatory Visit: Payer: Self-pay | Admitting: Gerontology

## 2022-01-01 ENCOUNTER — Other Ambulatory Visit: Payer: Self-pay

## 2022-01-01 DIAGNOSIS — J449 Chronic obstructive pulmonary disease, unspecified: Secondary | ICD-10-CM

## 2022-01-01 DIAGNOSIS — G4700 Insomnia, unspecified: Secondary | ICD-10-CM

## 2022-01-01 DIAGNOSIS — I10 Essential (primary) hypertension: Secondary | ICD-10-CM

## 2022-01-02 ENCOUNTER — Other Ambulatory Visit: Payer: Self-pay

## 2022-01-02 ENCOUNTER — Ambulatory Visit: Payer: Self-pay | Admitting: Licensed Clinical Social Worker

## 2022-01-02 MED FILL — Losartan Potassium Tab 25 MG: ORAL | 30 days supply | Qty: 30 | Fill #0 | Status: AC

## 2022-01-02 MED FILL — Trazodone HCl Tab 50 MG: ORAL | 30 days supply | Qty: 90 | Fill #0 | Status: AC

## 2022-01-02 MED FILL — Montelukast Sodium Tab 10 MG (Base Equiv): ORAL | 30 days supply | Qty: 30 | Fill #0 | Status: AC

## 2022-01-11 ENCOUNTER — Ambulatory Visit: Payer: Self-pay | Admitting: Licensed Clinical Social Worker

## 2022-01-18 ENCOUNTER — Ambulatory Visit: Payer: Self-pay | Admitting: Licensed Clinical Social Worker

## 2022-01-18 DIAGNOSIS — F4310 Post-traumatic stress disorder, unspecified: Secondary | ICD-10-CM

## 2022-01-18 DIAGNOSIS — F4323 Adjustment disorder with mixed anxiety and depressed mood: Secondary | ICD-10-CM

## 2022-01-18 NOTE — BH Specialist Note (Signed)
Integrated Behavioral Health Follow Up In-Person Visit ? ?MRN: [id number] ?Name: Amy Knapp ? ?Types of Service: Individual psychotherapy ? ?Interpretor:No. Interpretor Name and Language: N/A ? ?Subjective: ?Amy Knapp is a 66 y.o. female accompanied by  herself ?Patient was referred by [name], NP for mental health. ?Patient reports the following symptoms/concerns: The patient reports that she has been doing okay since her last folow-up appointment. She noted that she stopped coming to therapy session because she was grieving the loss of another son. She stated her son who was suffering with prostate cancer passed away, Amy Knapp noted that even though she had been through the loss of a child previously the urge to isolate and swallow her feelings persisted. She explained that it helps her to share her experience and advice with others who are grieving. She noted that she continues to struggle at work and find the environment very stressful. She discussed other situational stressors impacting her life currently. Amy Knapp noted that she had a lot of bedbug bites due to an infestation in her apartment complex. She noted that her landlord  is refusing to treat the apartments and only tells her to spay herself. The patient noted that overall she is coping with what life throws her way by leaning heavily on her faith. The patient denied any suicidal or homicidal thoughts. Amy Knapp  ?Duration of problem: Years; Severity of problem: moderate ? ?Objective: ?Mood: Euthymic and Affect: Appropriate ?Risk of harm to self or others: No plan to harm self or others ? ?Life Context: ?Family and Social: see above ?School/Work: see above ?Self-Care: see above ?Life Changes: see above ? ?Patient and/or Family's Strengths/Protective Factors: ?Concrete supports in place (healthy food, safe environments, etc.) and Sense of purpose ? ?Goals Addressed: ?Patient will: ? Reduce symptoms of: agitation,  anxiety, depression, insomnia, and stress  ? Increase knowledge and/or ability of: coping skills, healthy habits, self-management skills, and stress reduction  ? Demonstrate ability to: Increase healthy adjustment to current life circumstances ? ?Progress towards Goals: ?Ongoing ? ?Interventions: ?Interventions utilized:  Supportive Counselingwas utilized by the clinician during today's follow up session. Clinician met with patient to identify needs related to stressors and functioning, and assess and monitor for signs and symptoms of anxiety and depression, and assess safety. The clinician processed with the patient how they have been doing since the last follow-up session. Clinician measured the patient's anxiety and depression on a numerical scale. Clinician provided a safe judgment free space for the patient to share her frustrations regarding her current life circumstances. The clinician encouraged the patient to continue to  utilize their coping skills to deal with their current life circumstances. The session ended with scheduling.  ?Standardized Assessments completed: GAD-7 and PHQ 9 ?GAD-7= 13 ?PHQ-9= 11 ? ?Assessment: ?Patient currently experiencing see above.  ? ?Patient may benefit from see above. ? ?Plan: ?Follow up with behavioral health clinician on : 02/15/2022 at 6:00 PM ?Behavioral recommendations:  ?Referral(s): [place] (In Clinic) ?"From scale of 1-10, how likely are you to follow plan?":  ? ?Lesli Albee, LCSWA ? ? ?

## 2022-01-23 ENCOUNTER — Emergency Department
Admission: EM | Admit: 2022-01-23 | Discharge: 2022-01-23 | Disposition: A | Discharge status: Home / Self Care | Payer: Self-pay | Attending: Emergency Medicine | Admitting: Emergency Medicine

## 2022-01-23 ENCOUNTER — Ambulatory Visit: Payer: Self-pay | Admitting: Gerontology

## 2022-01-23 ENCOUNTER — Encounter: Payer: Self-pay | Admitting: Emergency Medicine

## 2022-01-23 ENCOUNTER — Emergency Department: Payer: Self-pay

## 2022-01-23 ENCOUNTER — Telehealth: Payer: Self-pay | Admitting: Emergency Medicine

## 2022-01-23 ENCOUNTER — Other Ambulatory Visit: Payer: Self-pay

## 2022-01-23 DIAGNOSIS — J40 Bronchitis, not specified as acute or chronic: Secondary | ICD-10-CM | POA: Insufficient documentation

## 2022-01-23 DIAGNOSIS — E119 Type 2 diabetes mellitus without complications: Secondary | ICD-10-CM | POA: Insufficient documentation

## 2022-01-23 DIAGNOSIS — J449 Chronic obstructive pulmonary disease, unspecified: Secondary | ICD-10-CM | POA: Insufficient documentation

## 2022-01-23 DIAGNOSIS — I10 Essential (primary) hypertension: Secondary | ICD-10-CM | POA: Insufficient documentation

## 2022-01-23 DIAGNOSIS — Z20822 Contact with and (suspected) exposure to covid-19: Secondary | ICD-10-CM | POA: Insufficient documentation

## 2022-01-23 DIAGNOSIS — R042 Hemoptysis: Secondary | ICD-10-CM | POA: Insufficient documentation

## 2022-01-23 LAB — BASIC METABOLIC PANEL
Anion gap: 9 (ref 5–15)
BUN: 12 mg/dL (ref 8–23)
CO2: 24 mmol/L (ref 22–32)
Calcium: 9.5 mg/dL (ref 8.9–10.3)
Chloride: 105 mmol/L (ref 98–111)
Creatinine, Ser: 0.76 mg/dL (ref 0.44–1.00)
GFR, Estimated: >60 mL/min (ref >60)
Glucose, Bld: 212 mg/dL — ABNORMAL HIGH (ref 70–99)
Potassium: 3.8 mmol/L (ref 3.5–5.1)
Sodium: 138 mmol/L (ref 135–145)

## 2022-01-23 LAB — CBC
HCT: 41.2 % (ref 36.0–46.0)
Hemoglobin: 12.9 g/dL (ref 12.0–15.0)
MCH: 29.1 pg (ref 26.0–34.0)
MCHC: 31.3 g/dL (ref 30.0–36.0)
MCV: 93 fL (ref 80.0–100.0)
Platelets: 244 10*3/uL (ref 150–400)
RBC: 4.43 MIL/uL (ref 3.87–5.11)
RDW: 12.7 % (ref 11.5–15.5)
WBC: 6.8 10*3/uL (ref 4.0–10.5)
nRBC: 0 % (ref 0.0–0.2)

## 2022-01-23 LAB — TROPONIN I (HIGH SENSITIVITY)
Troponin I (High Sensitivity): 4 ng/L (ref <18)
Troponin I (High Sensitivity): 5 ng/L (ref <18)

## 2022-01-23 LAB — RESP PANEL BY RT-PCR (FLU A&B, COVID) ARPGX2
Influenza A by PCR: NEGATIVE
Influenza B by PCR: NEGATIVE
SARS Coronavirus 2 by RT PCR: NEGATIVE

## 2022-01-23 MED ORDER — IOHEXOL 350 MG/ML SOLN
50.0000 mL | Freq: Once | INTRAVENOUS | Status: AC | PRN
  Administered 2022-01-23: 50 mL via INTRAVENOUS

## 2022-01-23 MED ORDER — IPRATROPIUM-ALBUTEROL 0.5-2.5 (3) MG/3ML IN SOLN
3.0000 mL | Freq: Once | RESPIRATORY_TRACT | Status: AC
Start: 2022-01-23 — End: 2022-01-23
  Administered 2022-01-23: 3 mL via RESPIRATORY_TRACT
  Filled 2022-01-23: qty 3

## 2022-01-23 MED ORDER — ALBUTEROL SULFATE HFA 108 (90 BASE) MCG/ACT IN AERS
2.0000 | INHALATION_SPRAY | Freq: Four times a day (QID) | RESPIRATORY_TRACT | 0 refills | Status: DC | PRN
  Filled 2022-01-23: qty 6.7, 25d supply, fill #0

## 2022-01-23 MED ORDER — DOXYCYCLINE HYCLATE 100 MG PO CAPS
100.0000 mg | ORAL_CAPSULE | Freq: Two times a day (BID) | ORAL | 0 refills | Status: AC
  Filled 2022-01-23: qty 14, 7d supply, fill #0

## 2022-01-23 NOTE — Telephone Encounter (Signed)
Patient called in today and made appointment for 3:30 today (01/23/22) because she was coughing up blood this morning. I called patient and she stated that she had a coughing spell this morning and the mucous had blood in it. Patient states that she is also having some chest pain. I advised patient to go to ED for evaluation per Lanora Manis, NP. Patient agreed and voiced understanding. ?

## 2022-01-23 NOTE — ED Triage Notes (Signed)
Pt via POV from home. Pt c/o SOB, mid-sternal chest pain, and states when she coughed she saw bright red blood. States it started today. Pt has a hx of COPD/asthma. Pt does still smoke daily. Pt is A&OX4 and NAD ?

## 2022-01-23 NOTE — ED Provider Notes (Signed)
? ?[facility] ?Provider Note ? ? ? Event Date/Time  ? First MD Initiated Contact with Patient 01/23/22 1656   ?  (approximate) ? ? ?History  ? ?Chief Complaint ?Shortness of Breath and Hemoptysis ? ? ?HPI ? ?Amy Knapp is a 66 y.o. female with past medical history of hypertension, hyperlipidemia, diabetes, and COPD who presents to the ED complaining of hemoptysis.  Patient reports that she has been dealing with a cough since waking up this morning which was initially productive of thin clear sputum.  After she had coughed a couple of times, she noticed a small amount of blood streaking through her sputum.  She describes a streak as large as her pinky finger that was bright red with no clots.  She states she has been feeling slightly more short of breath than usual today with some dull to aching discomfort in the center of her chest.  She denies any fevers and has not noticed any pain or swelling in her legs.  She does not take any blood thinners and denies any history of DVT/PE.  She has been using her albuterol at home without significant relief today.  She is not aware of any sick contacts.  She states that chest pain has resolved since her arrival to the ED. ?  ? ? ?Physical Exam  ? ?Triage Vital Signs: ?ED Triage Vitals  ?Enc Vitals Group  ?   BP 01/23/22 1444 137/68  ?   Pulse Rate 01/23/22 1444 78  ?   Resp 01/23/22 1444 18  ?   Temp 01/23/22 1444 98.1 ?F (36.7 ?C)  ?   Temp Source 01/23/22 1444 Oral  ?   SpO2 01/23/22 1444 94 %  ?   Weight 01/23/22 1442 146 lb (66.2 kg)  ?   Height 01/23/22 1442 5' 0.5" (1.537 m)  ?   Head Circumference --   ?   Peak Flow --   ?   Pain Score 01/23/22 1442 6  ?   Pain Loc --   ?   Pain Edu? --   ?   Excl. in [place]? --   ? ? ?Most recent vital signs: ?Vitals:  ? 01/23/22 1800 01/23/22 1830  ?BP: (!) 155/80 (!) 155/76  ?Pulse: 66 (!) 59  ?Resp: 18 17  ?Temp:    ?SpO2: 96% 95%  ? ? ?Constitutional: Alert and oriented. ?Eyes: Conjunctivae are  normal. ?Head: Atraumatic. ?Nose: No congestion/rhinnorhea. ?Mouth/Throat: Mucous membranes are moist.  ?Cardiovascular: Normal rate, regular rhythm. Grossly normal heart sounds.  2+ radial pulses bilaterally. ?Respiratory: Normal respiratory effort.  No retractions. Lungs with faint end expiratory wheezing bilaterally. ?Gastrointestinal: Soft and nontender. No distention. ?Musculoskeletal: No lower extremity tenderness nor edema.  ?Neurologic:  Normal speech and language. No gross focal neurologic deficits are appreciated. ? ? ? ?ED Results / Procedures / Treatments  ? ?Labs ?(all labs ordered are listed, but only abnormal results are displayed) ?Labs Reviewed  ?BASIC METABOLIC PANEL - Abnormal; Notable for the following components:  ?    Result Value  ? Glucose, Bld 212 (*)   ? All other components within normal limits  ?RESP PANEL BY RT-PCR (FLU A&B, COVID) [id number]  ?CBC  ?TROPONIN I (HIGH SENSITIVITY)  ?TROPONIN I (HIGH SENSITIVITY)  ? ? ? ?EKG ? ?ED ECG REPORT ?Tempie Hoist, the attending physician, personally viewed and interpreted this ECG. ? ? Date: 01/23/2022 ? EKG Time: 14:48 ? Rate: 81 ? Rhythm: normal sinus rhythm ?  Axis: RAD ? Intervals:none ? ST&T Change: None ? ?RADIOLOGY ?Chest x-ray reviewed by me with no infiltrate, edema, or effusion. ? ?PROCEDURES: ? ?Critical Care performed: No ? ?Procedures ? ? ?MEDICATIONS ORDERED IN ED: ?Medications  ?ipratropium-albuterol (DUONEB) 0.5-2.5 (3) MG/3ML nebulizer solution 3 mL (3 mLs Nebulization Given 01/23/22 1728)  ?iohexol (OMNIPAQUE) 350 MG/ML injection 50 mL (50 mLs Intravenous Contrast Given 01/23/22 1745)  ? ? ? ?IMPRESSION / MDM / ASSESSMENT AND PLAN / ED COURSE  ?I reviewed the triage vital signs and the nursing notes. ?             ?               ? ?66 y.o. female with past medical history of hypertension, hyperlipidemia, diabetes, and COPD who presents to the ED complaining of cough with hemoptysis, shortness of breath, and chest pain starting  earlier today. ? ?Differential diagnosis includes, but is not limited to, PE, bronchitis, malignancy, ACS, pneumonia, COPD exacerbation. ? ?Patient nontoxic-appearing and in no acute distress, vital signs are unremarkable and she is breathing comfortably, maintaining O2 sats on room air.  She does have mild end expiratory wheezing on exam and symptoms could potentially be related to acute on chronic bronchitis.  We will treat with DuoNeb but hold off on steroids given her history of diabetes and only mild wheezing at this time.  We will check CTA to rule out PE and pneumonia, chest x-ray is unremarkable.  EKG shows no evidence of arrhythmia or ischemia and initial troponin is negative, follow-up is pending.  CBC shows no anemia and BMP without electrolyte abnormality. ? ?Repeat troponin within normal limits, CTA is negative for PE or other acute process.  Wheezing is resolved on reevaluation and suspect patient's mild mopped assist is related to acute on chronic bronchitis.  Given significant sputum production, we will prescribe patient antibiotics as well as refill of her albuterol.  She was counseled to follow-up with her PCP or pulmonologist and to return to the ED for new worsening symptoms.  Patient agrees with plan. ? ?  ? ? ?FINAL CLINICAL IMPRESSION(S) / ED DIAGNOSES  ? ?Final diagnoses:  ?Hemoptysis  ?Bronchitis  ? ? ? ?Rx / DC Orders  ? ?ED Discharge Orders   ? ?      Ordered  ?  doxycycline (VIBRAMYCIN) 100 MG capsule  2 times daily       ? 01/23/22 1852  ?  albuterol (VENTOLIN HFA) 108 (90 Base) MCG/ACT inhaler  Every 6 hours PRN       ?Note to Pharmacy: Please supply with spacer  ? 01/23/22 1852  ? ?  ?  ? ?  ? ? ? ?Note:  This document was prepared using Dragon voice recognition software and may include unintentional dictation errors. ?  ?Blake Divine, MD ?01/23/22 1853 ? ?

## 2022-01-24 ENCOUNTER — Other Ambulatory Visit: Payer: Self-pay

## 2022-02-01 ENCOUNTER — Ambulatory Visit: Payer: Self-pay | Admitting: Family Medicine

## 2022-02-01 ENCOUNTER — Ambulatory Visit: Payer: Self-pay | Admitting: Gerontology

## 2022-02-01 VITALS — BP 161/89 | HR 64 | Temp 98.0°F | Ht 61.0 in | Wt 154.5 lb

## 2022-02-01 DIAGNOSIS — E039 Hypothyroidism, unspecified: Secondary | ICD-10-CM

## 2022-02-01 DIAGNOSIS — I10 Essential (primary) hypertension: Secondary | ICD-10-CM

## 2022-02-01 DIAGNOSIS — J449 Chronic obstructive pulmonary disease, unspecified: Secondary | ICD-10-CM

## 2022-02-01 DIAGNOSIS — E785 Hyperlipidemia, unspecified: Secondary | ICD-10-CM

## 2022-02-01 DIAGNOSIS — E1140 Type 2 diabetes mellitus with diabetic neuropathy, unspecified: Secondary | ICD-10-CM

## 2022-02-01 DIAGNOSIS — L602 Onychogryphosis: Secondary | ICD-10-CM | POA: Insufficient documentation

## 2022-02-01 LAB — POCT GLYCOSYLATED HEMOGLOBIN (HGB A1C): Hemoglobin A1C: 6.4 % — AB (ref 4.0–5.6)

## 2022-02-01 LAB — GLUCOSE, POCT (MANUAL RESULT ENTRY): POC Glucose: 87 mg/dl (ref 70–99)

## 2022-02-01 MED ORDER — LOSARTAN POTASSIUM 25 MG PO TABS
25.0000 mg | ORAL_TABLET | Freq: Every day | ORAL | 2 refills | Status: DC
  Filled 2022-02-01 – 2022-03-08 (×2): qty 30, 30d supply, fill #0
  Filled 2022-04-13: qty 30, 30d supply, fill #1

## 2022-02-01 MED ORDER — MONTELUKAST SODIUM 10 MG PO TABS
ORAL_TABLET | Freq: Every day | ORAL | 1 refills | Status: DC
  Filled 2022-02-01 – 2022-03-08 (×2): qty 30, 30d supply, fill #0

## 2022-02-01 MED ORDER — LEVOTHYROXINE SODIUM 100 MCG PO TABS
ORAL_TABLET | ORAL | 3 refills | Status: DC
  Filled 2022-02-01 – 2022-03-08 (×2): qty 30, 30d supply, fill #0
  Filled 2022-04-12: qty 30, 30d supply, fill #1
  Filled 2022-05-15: qty 30, 30d supply, fill #2
  Filled 2022-06-19: qty 30, 30d supply, fill #3

## 2022-02-01 NOTE — Progress Notes (Signed)
? ?New Patient Office Visit ? ?Subjective   ? ?Patient ID: Amy Knapp, female    DOB: [birth year]  Age: 66 y.o. MRN: [id number] ? ?CC:  ?Chief Complaint  ?Patient presents with  ? Follow-up  ?  Routine check up  ? ?HPI ?Amy Knapp  is a 66 y/o female with a history of Allergy, Anxiety, Arthritis, Asthma, COPD, Depression, Diabetes  who presents for routine follow up visit and medications refill. She states that she is compliant with her medications, continues to make healthy lifestyle changes. Her HgbA1c done during the visit decreased from 6.7% to 6.4% and her blood glucose reading was 86 mg/dl. She c/o  localized  sharp pain on the right mid back that started this morning and is at 5/10. She denies any symptoms of bowel and bladder incontinence nor saddle anesthesia. She states that she checks her fasting blood glucose daily, and it runs between 100- 120 mg/dl. She denies any sign and symptoms of hpo/hyerglycemia and  peripheral neuropathy. She  states that her mood is good, denies suicidal nor homicidal ideation. Mono filament test was checked during visit and it was normal on both feet., but she has thick overgrown toe nails. She is otherwise doing well generally and denies  any other compliant. ? ?  ?Outpatient Encounter Medications as of 02/01/2022  ?Medication Sig  ? albuterol (PROVENTIL HFA) 108 (90 Base) MCG/ACT inhaler Inhale 2 puffs into the lungs once every 6 (six) hours as needed for wheezing or shortness of breath.  ? allopurinol (ZYLOPRIM) 100 MG tablet TAKE ONE TABLET BY MOUTH EVERY DAY  ? atorvastatin (LIPITOR) 10 MG tablet TAKE ONE TABLET BY MOUTH ONCE EVERY DAY.  ? blood glucose meter kit and supplies KIT Dispense based on patient and insurance preference. Use up to four times daily as directed. (FOR ICD-9 250.00, 250.01).  ? doxycycline (VIBRAMYCIN) 100 MG capsule Take 1 capsule (100 mg total) by mouth 2 (two) times daily for 7 days.  ? traZODone (DESYREL) 50 MG tablet  Take 3 tablets (150 mg total) by mouth once daily at bedtime.  ? [DISCONTINUED] levothyroxine (SYNTHROID) 100 MCG tablet TAKE ONE TABLET BY MOUTH ONCE DAILY 45 MINUTES BEFORE BREAKFAST.  ? [DISCONTINUED] losartan (COZAAR) 25 MG tablet Take 1 tablet (25 mg total) by mouth once daily.  ? [DISCONTINUED] montelukast (SINGULAIR) 10 MG tablet TAKE ONE TABLET BY MOUTH ONCE DAILY AT BEDTIME  ? gabapentin (NEURONTIN) 400 MG capsule Take 1 capsule (400 mg total) by mouth 3 (three) times daily.  ? ibuprofen (ADVIL) 600 MG tablet Take 1 tablet (600 mg total) by mouth every 6 (six) hours as needed for mild pain or moderate pain. (Patient not taking: Reported on 02/01/2022)  ? levothyroxine (SYNTHROID) 100 MCG tablet TAKE ONE TABLET BY MOUTH ONCE DAILY 45 MINUTES BEFORE BREAKFAST.  ? losartan (COZAAR) 25 MG tablet Take 1 tablet (25 mg total) by mouth once daily.  ? montelukast (SINGULAIR) 10 MG tablet TAKE ONE TABLET BY MOUTH ONCE DAILY AT BEDTIME  ? oxyCODONE (OXY IR/ROXICODONE) 5 MG immediate release tablet Take 1-2 tablets (5-10 mg total) by mouth every 4 (four) hours as needed for moderate pain. (Patient not taking: Reported on 02/01/2022)  ? ?No facility-administered encounter medications on file as of 02/01/2022.  ? ? ?Past Medical History:  ?Diagnosis Date  ? Allergy   ? Anxiety   ? Arthritis   ? Asthma   ? Broken leg 2007  ? Right  ? Cataract   ?  Complex renal cyst 06/24/2021  ? COPD (chronic obstructive pulmonary disease) ([place])   ? Depression   ? Diabetes mellitus without complication ([place])   ? Emphysema of lung ([place])   ? Essential hypertension 04/20/2021  ? GERD (gastroesophageal reflux disease)   ? Gout   ? History of hiatal hernia   ? Hyperlipidemia   ? Hypothyroidism   ? Sleep apnea   ? Thyroid disease   ? ? ?Past Surgical History:  ?Procedure Laterality Date  ? ABDOMINAL HYSTERECTOMY  2000  ? Partial  ? COLONOSCOPY WITH PROPOFOL N/A 04/11/2021  ? Procedure: COLONOSCOPY WITH PROPOFOL;  Surgeon: Lucilla Lame, MD;   Location: [facility] ENDOSCOPY;  Service: Endoscopy;  Laterality: N/A;  ? ESOPHAGOGASTRODUODENOSCOPY N/A 04/11/2021  ? Procedure: ESOPHAGOGASTRODUODENOSCOPY (EGD);  Surgeon: Lucilla Lame, MD;  Location[facility] ENDOSCOPY;  Service: Endoscopy;  Laterality: N/A;  ? INSERTION OF MESH  08/29/2021  ? Procedure: INSERTION OF MESH;  Surgeon: Jules Husbands, MD;  Location: [facility] ORS;  Service: General;;  ? LEG SURGERY Right 10/08/2005  ? broken right lower leg; plate and screws  ? PATELLA FRACTURE SURGERY Right   ? TUBAL LIGATION    ? XI ROBOTIC ASSISTED HIATAL HERNIA REPAIR N/A 08/29/2021  ? Procedure: XI ROBOTIC ASSISTED HIATAL HERNIA REPAIR, RNFA to assist;  Surgeon: Jules Husbands, MD;  Location: [facility] ORS;  Service: General;  Laterality: N/A;  ? ? ?Family History  ?Problem Relation Age of Onset  ? Cancer Mother   ? Heart attack Father   ? Cancer Brother   ?     colon  ? Heart attack Brother 62  ? Diabetes Daughter   ? Diabetes Son   ? Hypertension Maternal Aunt   ? Breast cancer Neg Hx   ? ? ?Social History  ? ?Socioeconomic History  ? Marital status: Divorced  ?  Spouse name: Not on file  ? Number of children: 4  ? Years of education: Not on file  ? Highest education level: High school graduate  ?Occupational History  ? Occupation: Unemployed  ?Tobacco Use  ? Smoking status: Former  ?  Packs/day: 0.15  ?  Types: Cigarettes  ?  Quit date: 01/26/2022  ?  Years since quitting: 0.0  ? Smokeless tobacco: Former  ?  Quit date: 74  ? Tobacco comments:  ?  Smoking 2 cigarettes/day  ?  Patient dipped snuff and used smokeless tobacco when she was very young. Patient stopped at age 62.  ?Vaping Use  ? Vaping Use: Never used  ?Substance and Sexual Activity  ? Alcohol use: No  ? Drug use: No  ? Sexual activity: Not Currently  ?Other Topics Concern  ? Not on file  ?Social History Narrative  ? Not on file  ? ?Social Determinants of Health  ? ?Financial Resource Strain: Low Risk   ? Difficulty of Paying Living Expenses: Not hard at all  ?Food  Insecurity: No Food Insecurity  ? Worried About Charity fundraiser in the Last Year: Never true  ? Ran Out of Food in the Last Year: Never true  ?Transportation Needs: No Transportation Needs  ? Lack of Transportation (Medical): No  ? Lack of Transportation (Non-Medical): No  ?Physical Activity: Sufficiently Active  ? Days of Exercise per Week: 7 days  ? Minutes of Exercise per Session: 150+ min  ?Stress: Stress Concern Present  ? Feeling of Stress : Rather much  ?Social Connections: Moderately Isolated  ? Frequency of Communication with Friends and Family:  More than three times a week  ? Frequency of Social Gatherings with Friends and Family: Once a week  ? Attends Religious Services: More than 4 times per year  ? Active Member of Clubs or Organizations: No  ? Attends Archivist Meetings: Never  ? Marital Status: Widowed  ?Intimate Partner Violence: Not At Risk  ? Fear of Current or Ex-Partner: No  ? Emotionally Abused: No  ? Physically Abused: No  ? Sexually Abused: No  ? ? ?Review of Systems  ?Constitutional: Negative.   ?HENT: Negative.    ?Eyes: Negative.   ?Respiratory: Negative.    ?Cardiovascular: Negative.   ?Gastrointestinal: Negative.   ?Genitourinary: Negative.   ?Musculoskeletal:  Positive for back pain (on the right mid back).  ?Skin: Negative.   ?Neurological: Negative.   ?Endo/Heme/Allergies: Negative.   ?Psychiatric/Behavioral: Negative.    ? ?  ? ? ?Objective   ? ?BP (!) 161/89 (BP Location: Right Arm, Patient Position: Sitting, Cuff Size: Small)   Pulse 64   Temp 98 ?F (36.7 ?C) (Oral)   Ht _0  (1.549 m)   Wt 154 lb 8 oz (70.1 kg)   SpO2 94%   BMI 29.19 kg/m?  ? ?Physical Exam ?HENT:  ?   Head: Normocephalic.  ?Cardiovascular:  ?   Rate and Rhythm: Normal rate and regular rhythm.  ?Pulmonary:  ?   Breath sounds: Normal breath sounds.  ?Abdominal:  ?   General: Bowel sounds are normal.  ?Musculoskeletal:     ?   General: Normal range of motion.  ?   Cervical back: Normal range of  motion.  ?Skin: ?   General: Skin is warm.  ?Neurological:  ?   General: No focal deficit present.  ?   Mental Status: She is alert and oriented to person, place, and time.  ?Psychiatric:     ?   Mood and Affect: M

## 2022-02-01 NOTE — Patient Instructions (Signed)

## 2022-02-02 ENCOUNTER — Other Ambulatory Visit: Payer: Self-pay

## 2022-02-06 ENCOUNTER — Other Ambulatory Visit: Payer: Self-pay

## 2022-02-09 ENCOUNTER — Other Ambulatory Visit: Payer: Self-pay

## 2022-02-15 ENCOUNTER — Ambulatory Visit: Payer: Self-pay | Admitting: Licensed Clinical Social Worker

## 2022-02-16 IMAGING — RF DG ESOPHAGUS
10 of 11 series · 14 of 24 positions shown · non-contrast
Comparison: None.

CLINICAL DATA: Hiatal hernia identified by EGD, history of
stretching, reflux, dysphagia

EXAM:
ESOPHOGRAM / BARIUM SWALLOW / BARIUM TABLET STUDY
TECHNIQUE: Combined double contrast and single contrast examination performed
using effervescent crystals, thick barium liquid, and thin barium
liquid. The patient was observed with fluoroscopy swallowing a 13 mm
barium sulphate tablet.
FLUOROSCOPY TIME:  Fluoroscopy Time:  [DATE]
Number of Acquired Spot Images: 317

[Series 1: cp_standard · 0.25mm/px · 1 of 1 slices shown (1 of 6)]
[im 1/1]
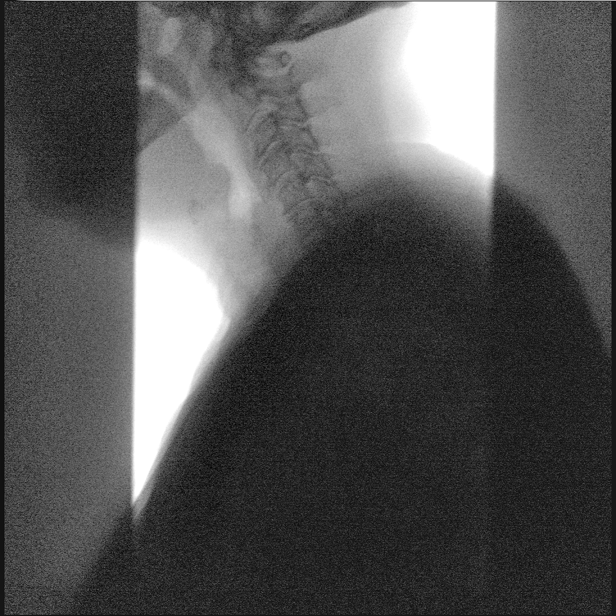

[Series 2: cp_standard · 0.17mm/px · 1 of 37 frames shown (2 of 6)]
[frame 32/37]
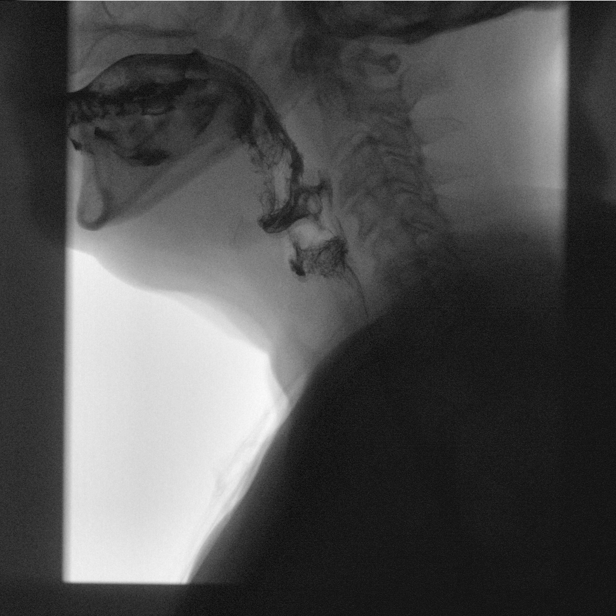

[Series 3: cp_standard · 0.17mm/px · 1 of 60 frames shown (3 of 6)]
[frame 31/60]
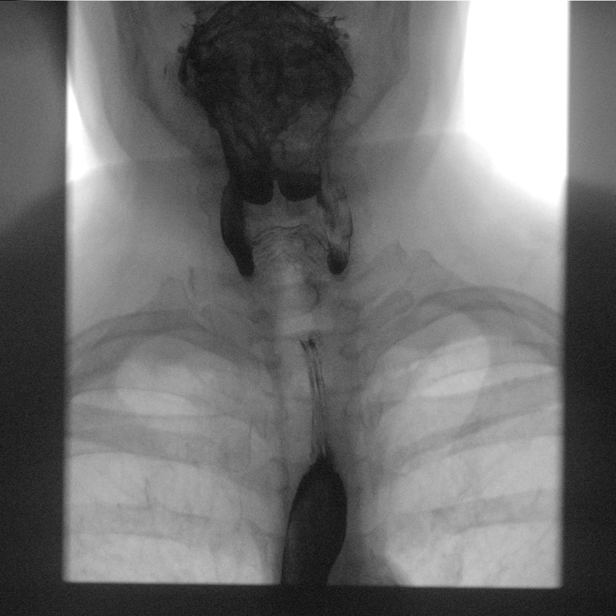

[Series 4: cp_standard · 0.26mm/px · 2 of 67 frames shown (4 of 6)]
[frame 11/67]
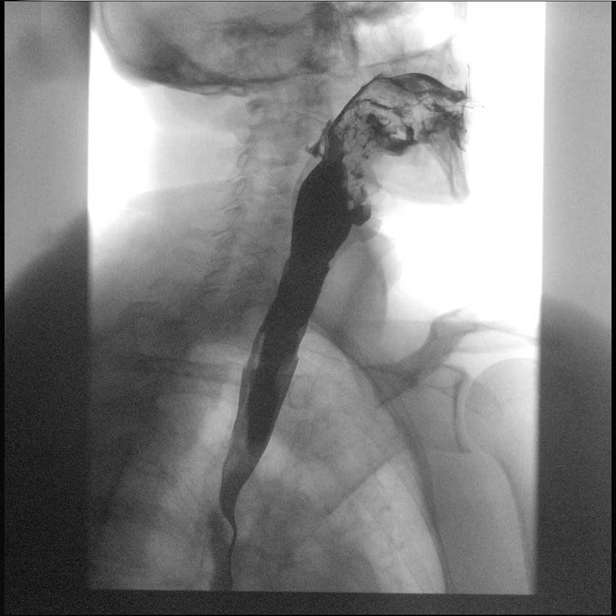
[frame 34/67]
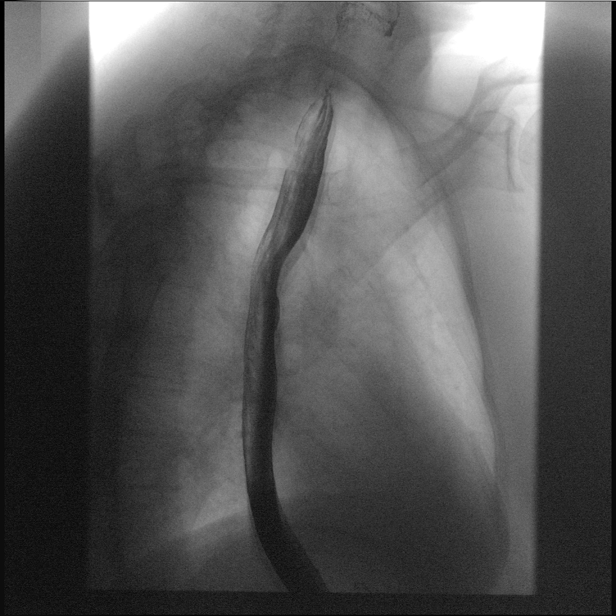

[Series 5: fluoro_barium 2fps_bw · 0.17mm/px · 2 of 4 frames shown (1 of 4)]
[frame 1/4]
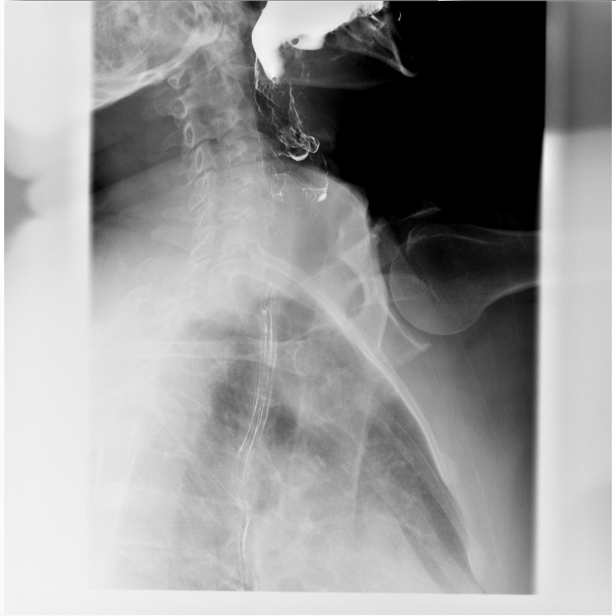
[frame 4/4]
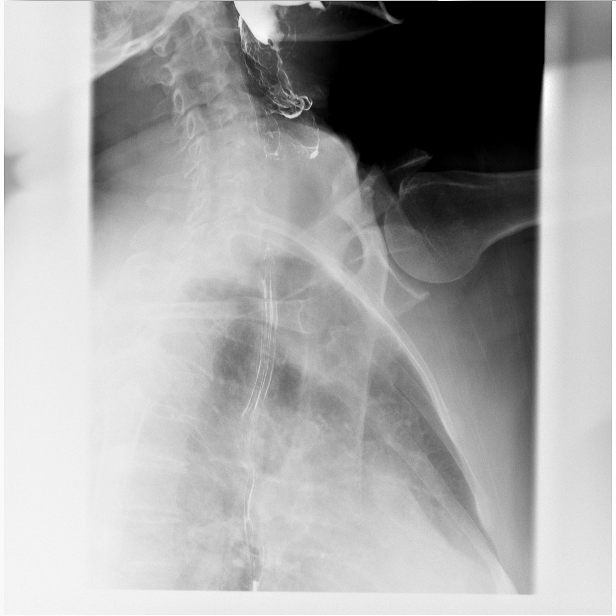

[Series 6: fluoro_barium 2fps_bw · 0.17mm/px · 1 of 9 frames shown (2 of 4)]
[frame 2/9]
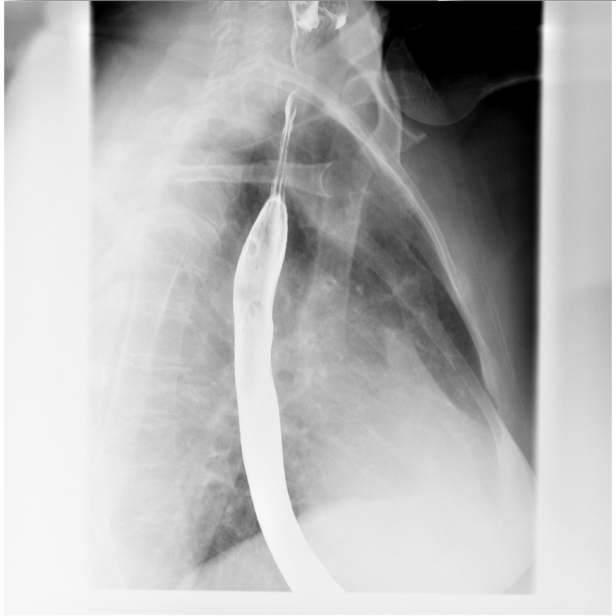

[Series 7: fluoro_barium 2fps_bw · 0.17mm/px · 1 of 1 slices shown (3 of 4)]
[im 1/1]
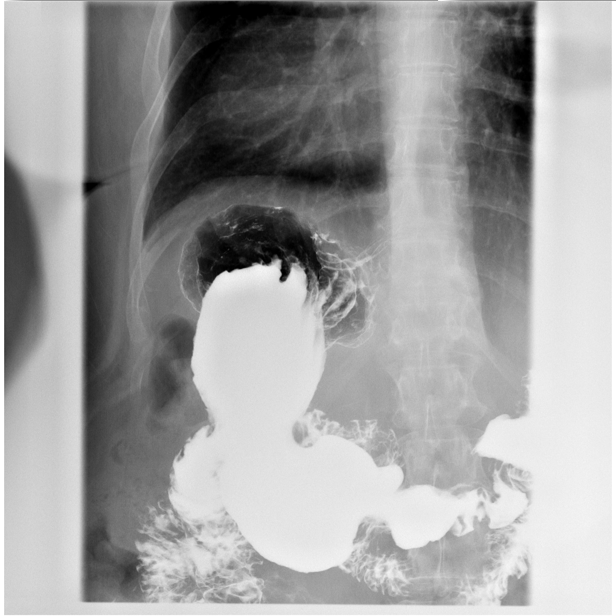

[Series 8: cp_standard · 0.26mm/px · 1 of 78 frames shown (5 of 6)]
[frame 40/78]
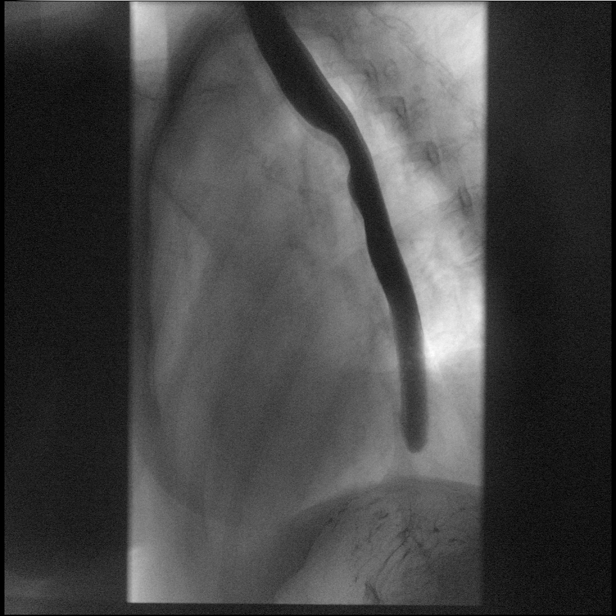

[Series 9: fluoro_barium 2fps_bw · 0.17mm/px · 2 of 6 frames shown (4 of 4)]
[frame 2/6]
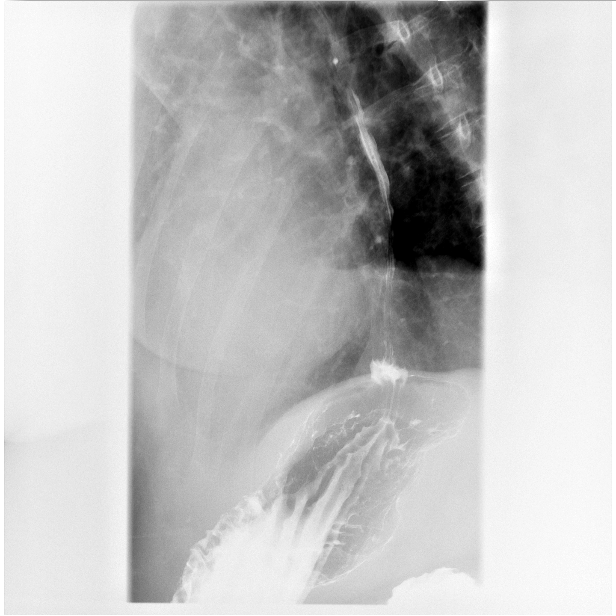
[frame 4/6]
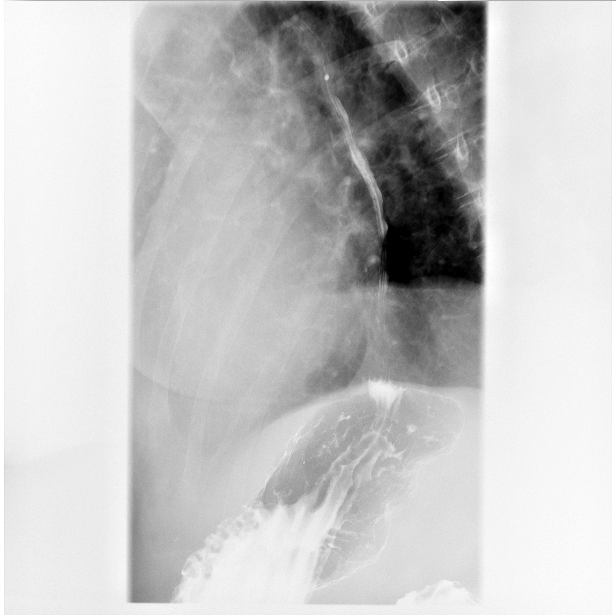

[Series 11: cp_standard · 0.26mm/px · 2 of 57 frames shown (6 of 6)]
[frame 9/57]
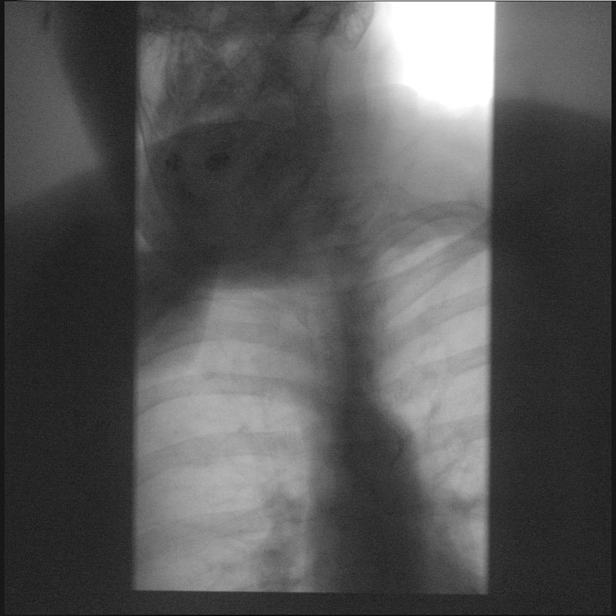
[frame 49/57]
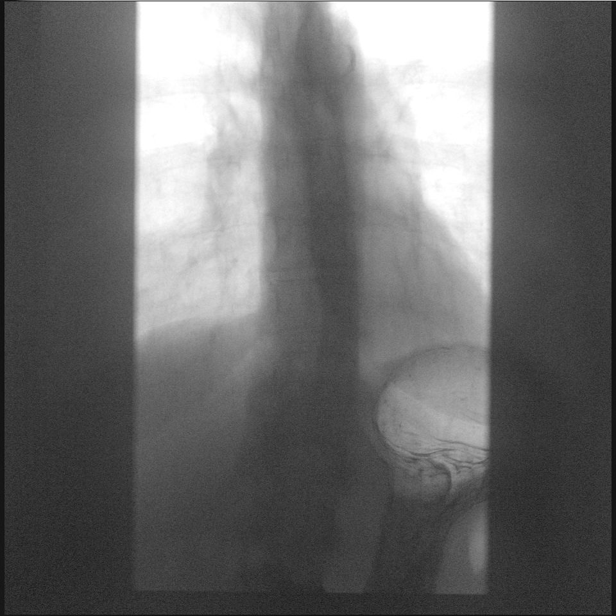

[14 of 24 positions shown; findings below may reference images not displayed]

FINDINGS: Normal oropharyngeal phase of swallow. No evidence of penetration or
aspiration. Normal contour and caliber of the esophagus. No evidence
of hiatal hernia at the time of this examination. Moderate
esophageal dysmotility throughout.

Normal, partial double contrast appearance of the stomach and
proximal small bowel.

Moderate, spontaneous gastroesophageal reflux to the midesophagus.

A swallowed 13 mm barium tablet passes readily through the
gastroesophageal junction.
IMPRESSION: 1.  No evidence of hiatal hernia at the time of this examination.

2.  Moderate esophageal dysmotility.

3. Moderate, spontaneous gastroesophageal reflux to the
midesophagus.

4. No evidence of recurrent esophageal stricture following
stretching procedure.

## 2022-02-20 ENCOUNTER — Ambulatory Visit: Payer: Self-pay | Admitting: Licensed Clinical Social Worker

## 2022-02-20 DIAGNOSIS — F4310 Post-traumatic stress disorder, unspecified: Secondary | ICD-10-CM

## 2022-02-20 DIAGNOSIS — F4323 Adjustment disorder with mixed anxiety and depressed mood: Secondary | ICD-10-CM

## 2022-02-20 NOTE — BH Specialist Note (Deleted)
Integrated Behavioral Health via Telemedicine Visit ? ?02/20/2022 ?Alexius Swinderman ?[id number] ? ?Number of [place] Clinician visits: No data recorded ?Session Start time: No data recorded  ?Session End time: No data recorded ?Total time in minutes: No data recorded ? ?Referring Provider: *** ?Patient/Family location: *** ?[facility] Provider location: *** ?All persons participating in visit: *** ?Types of Service: {CHL AMB TYPE OF SERVICE:[phone]} ? ?I connected with [name] and/or Matthew Folks Bondy's {family members:20773} via  Telephone or Video Enabled Telemedicine Application  (Video is Caregility application) and verified that I am speaking with the correct person using two identifiers. Discussed confidentiality: {YES/NO:21197} ? ?I discussed the limitations of telemedicine and the availability of in person appointments.  Discussed there is a possibility of technology failure and discussed alternative modes of communication if that failure occurs. ? ?I discussed that engaging in this telemedicine visit, they consent to the provision of behavioral healthcare and the services will be billed under their insurance. ? ?Patient and/or legal guardian expressed understanding and consented to Telemedicine visit: {YES/NO:21197} ? ?Presenting Concerns: ?Patient and/or family reports the following symptoms/concerns: *** ?Duration of problem: ***; Severity of problem: {Mild/Moderate/Severe:20260} ? ?Patient and/or Family's Strengths/Protective Factors: ?{CHL AMB BH PROTECTIVE FACTORS:[id number]} ? ?Goals Addressed: ?Patient will: ? Reduce symptoms of: {IBH Symptoms:21014056}  ? Increase knowledge and/or ability of: {IBH Patient Tools:21014057}  ? Demonstrate ability to: {IBH Goals:21014053} ? ?Progress towards Goals: ?{CHL AMB BH PROGRESS TOWARDS FZ:9156718 ? ?Interventions: ?Interventions utilized:  {IBH Interventions:21014054} ?Standardized Assessments completed:  {IBH Screening Tools:21014051} ? ?Patient and/or Family Response: *** ? ?Assessment: ?Patient currently experiencing ***.  ? ?Patient may benefit from ***. ? ?Plan: ?Follow up with behavioral health clinician on : *** ?Behavioral recommendations: *** ?Referral(s): {IBH Referrals:21014055} ? ?I discussed the assessment and treatment plan with the patient and/or parent/guardian. They were provided an opportunity to ask questions and all were answered. They agreed with the plan and demonstrated an understanding of the instructions. ?  ?They were advised to call back or seek an in-person evaluation if the symptoms worsen or if the condition fails to improve as anticipated. ? ?Lesli Albee, LCSWA ?

## 2022-02-21 NOTE — BH Specialist Note (Signed)
Integrated Behavioral Health Follow Up In-Person Visit  MRN: [id number] Name: Amy Knapp    Types of Service: Individual psychotherapy  Interpretor:No. Interpretor Name and Language: N/A  Subjective: Amy Knapp is a 66 y.o. female accompanied by  herself  Patient was referred by [name], NP  for mental health . Patient reports the following symptoms/concerns: The patient reports that she has been doing the same since her last follow-up appointment. She noted that she continues to struggle on her job both with the physical and mental demands of the work. She noted she typically experiences a sense of impending dread before her shift that begins even a day so before she has to report for work. The patient reported that she recently experienced a panic attack shortly before her shift. She noted that later her supervisor made a comment if she was okay which made her more uncomfortable. Ms. Monjaras discussed the losses of her sons and other family members. She shared that she finds strength in helping other's. She discussed situational stressors in her life currently. She noted that she has been focusing on helping her church raise funds for a new building instead of on the things in her life that she can not change. The patient denied any suicidal or homicidal thoughts.  Duration of problem: Years; Severity of problem: moderate  Objective: Mood: Euthymic and Affect: Appropriate Risk of harm to self or others: No plan to harm self or others  Life Context: Family and Social: see above School/Work: see above Self-Care: see above Life Changes: see above  Patient and/or Family's Strengths/Protective Factors: Social connections, Concrete supports in place (healthy food, safe environments, etc.), and Sense of purpose  Goals Addressed: Patient will:  Reduce symptoms of: agitation, anxiety, depression, insomnia, and stress   Increase knowledge and/or ability of:  coping skills, healthy habits, self-management skills, and stress reduction   Demonstrate ability to: Increase healthy adjustment to current life circumstances  Progress towards Goals: Ongoing  Interventions: Interventions utilized:  CBT Cognitive Behavioral Therapy was utilized by the clinician during today's follow up session. Clinician met with patient to identify needs related to stressors and functioning, and assess and monitor for signs and symptoms of anxiety and depression, and assess safety. The clinician processed with the patient how they have been doing since the last follow-up session. Clinician measured the patients depression and anxiety on a numerical scale. Clinician used behavioral activation to help the patient to engage in activities that they enjoy and find meaningful, even if they do not feel like it in the moment. Clinician encouraged the patient to calmly self advocate for her needs to met at her place of work. Clinician encouraged the patient to continue to utilize their coping skills to deal with their current life circumstances. The session ended with scheduling.   Standardized Assessments completed: GAD-7 and PHQ 9 PHQ-9= 12 GAD-7= 13   Assessment: Patient currently experiencing see above.   Patient may benefit from see above.  Plan: Follow up with behavioral health clinician on : 03/13/2022 at 5:00 PM  Behavioral recommendations:  Referral(s): [place] (In Clinic) "From scale of 1-10, how likely are you to follow plan?":   Lesli Albee, LCSWA

## 2022-03-08 ENCOUNTER — Ambulatory Visit: Payer: No Typology Code available for payment source | Admitting: Podiatry

## 2022-03-08 ENCOUNTER — Other Ambulatory Visit: Payer: Self-pay

## 2022-03-09 ENCOUNTER — Other Ambulatory Visit: Payer: Self-pay | Admitting: Gerontology

## 2022-03-09 ENCOUNTER — Other Ambulatory Visit: Payer: Self-pay

## 2022-03-09 DIAGNOSIS — G4700 Insomnia, unspecified: Secondary | ICD-10-CM

## 2022-03-09 DIAGNOSIS — E785 Hyperlipidemia, unspecified: Secondary | ICD-10-CM

## 2022-03-12 ENCOUNTER — Other Ambulatory Visit: Payer: Self-pay

## 2022-03-12 ENCOUNTER — Other Ambulatory Visit: Payer: Self-pay | Admitting: Gerontology

## 2022-03-12 DIAGNOSIS — G4700 Insomnia, unspecified: Secondary | ICD-10-CM

## 2022-03-12 DIAGNOSIS — E785 Hyperlipidemia, unspecified: Secondary | ICD-10-CM

## 2022-03-13 ENCOUNTER — Other Ambulatory Visit: Payer: Self-pay

## 2022-03-13 ENCOUNTER — Ambulatory Visit: Payer: Self-pay | Admitting: Licensed Clinical Social Worker

## 2022-03-13 MED FILL — Atorvastatin Calcium Tab 10 MG (Base Equivalent): ORAL | 30 days supply | Qty: 30 | Fill #0 | Status: AC

## 2022-03-13 MED FILL — Trazodone HCl Tab 50 MG: ORAL | 30 days supply | Qty: 90 | Fill #0 | Status: AC

## 2022-03-14 ENCOUNTER — Other Ambulatory Visit: Payer: Self-pay

## 2022-03-15 ENCOUNTER — Ambulatory Visit: Payer: Self-pay | Admitting: Licensed Clinical Social Worker

## 2022-03-15 ENCOUNTER — Other Ambulatory Visit: Payer: Self-pay

## 2022-03-15 ENCOUNTER — Other Ambulatory Visit: Payer: Self-pay | Admitting: Emergency Medicine

## 2022-03-15 ENCOUNTER — Other Ambulatory Visit: Payer: Self-pay | Admitting: Gerontology

## 2022-03-15 DIAGNOSIS — E785 Hyperlipidemia, unspecified: Secondary | ICD-10-CM

## 2022-03-15 DIAGNOSIS — E039 Hypothyroidism, unspecified: Secondary | ICD-10-CM

## 2022-03-15 DIAGNOSIS — F4323 Adjustment disorder with mixed anxiety and depressed mood: Secondary | ICD-10-CM

## 2022-03-15 DIAGNOSIS — F4310 Post-traumatic stress disorder, unspecified: Secondary | ICD-10-CM

## 2022-03-15 NOTE — BH Specialist Note (Signed)
Integrated Behavioral Health Follow Up In-Person Visit  MRN: [id number] Name: Amy Knapp  Types of Service: Individual psychotherapy  Interpretor:No. Interpretor Name and Language: N/A  Subjective: Amy Knapp is a 66 y.o. female accompanied by  herself Patient was referred by [name], NP for mental health. Patient reports the following symptoms/concerns: The patient reported that she has been doing the same since her last follow-up appointment.  Ms. Zwilling noted that she continues to struggle with interpersonal relationships both in her place of work and personal life.  She discussed other situational stressors that are having an impact on her life currently.  She noted that she has been using self-care such as taking weekend trips by herself to relax.  Ms. Dantuono shared that since her current supervisor is leaving she is planning on retiring early.  The patient noted she is looking forward to seeing and spending time with her family and continuing to volunteer at her church.  The patient denied any suicidal or homicidal thoughts. Duration of problem: Years; Severity of problem: moderate  Objective: Mood: Euphoric and Affect: Appropriate Risk of harm to self or others: No plan to harm self or others  Life Context: Family and Social: see above School/Work: see above Self-Care: see above Life Changes: see above  Patient and/or Family's Strengths/Protective Factors: Social connections, Concrete supports in place (healthy food, safe environments, etc.), and Sense of purpose  Goals Addressed: Patient will:  Reduce symptoms of: anxiety, depression, insomnia, and stress   Increase knowledge and/or ability of: coping skills, healthy habits, self-management skills, and stress reduction   Demonstrate ability to: Increase healthy adjustment to current life circumstances  Progress towards Goals: Ongoing  Interventions: Interventions utilized:  CBT  Cognitive Behavioral Therapywas utilized by the clinician during today's follow up session. Clinician met with patient to identify needs related to stressors and functioning, and assess and monitor for signs and symptoms of anxiety and depression, and assess safety. The clinician processed with the patient how they have been doing since the last follow-up session.  Clinician intervened with positive regard and optimism to validate client's emotions, and supported client in exploring ways to increase her self care and reduce stressors in her life.  The session ended with scheduling. Standardized Assessments completed:  Due to time constraints will complete at  next follow up appointment  Assessment: Patient currently experiencing see above.   Patient may benefit from see above.  Plan: Follow up with behavioral health clinician on : August 2023 Behavioral recommendations:  Referral(s): [place] (In Clinic) "From scale of 1-10, how likely are you to follow plan?":   Lesli Albee, LCSWA

## 2022-03-16 LAB — LIPID PANEL
Chol/HDL Ratio: 3.3 ratio (ref 0.0–4.4)
Cholesterol, Total: 210 mg/dL — ABNORMAL HIGH (ref 100–199)
HDL: 63 mg/dL (ref >39)
LDL Chol Calc (NIH): 126 mg/dL — ABNORMAL HIGH (ref 0–99)
Triglycerides: 118 mg/dL (ref 0–149)
VLDL Cholesterol Cal: 21 mg/dL (ref 5–40)

## 2022-03-16 LAB — TSH: TSH: 1.89 u[IU]/mL (ref 0.450–4.500)

## 2022-03-22 ENCOUNTER — Ambulatory Visit: Payer: Self-pay | Admitting: Gerontology

## 2022-03-22 ENCOUNTER — Other Ambulatory Visit: Payer: Self-pay

## 2022-03-22 VITALS — BP 145/81 | HR 77 | Temp 98.3°F | Resp 16 | Ht 61.0 in | Wt 151.7 lb

## 2022-03-22 DIAGNOSIS — R10A1 Flank pain, right side: Secondary | ICD-10-CM | POA: Insufficient documentation

## 2022-03-22 DIAGNOSIS — R109 Unspecified abdominal pain: Secondary | ICD-10-CM | POA: Insufficient documentation

## 2022-03-22 DIAGNOSIS — E785 Hyperlipidemia, unspecified: Secondary | ICD-10-CM

## 2022-03-22 DIAGNOSIS — E039 Hypothyroidism, unspecified: Secondary | ICD-10-CM

## 2022-03-22 NOTE — Progress Notes (Signed)
Established Patient Office Visit  Subjective   Patient ID: Amy Knapp, female    DOB: Mar 03, 1956  Age: 66 y.o. MRN: [id number]  Chief Complaint  Patient presents with   Follow-up    Routine visit    HPI  Amy Knapp  is a 66 y/o female with a history of Allergy, Anxiety, Arthritis, Asthma, COPD, Depression, Diabetes  who presents for routine follow up visit , lab review and medications refill. Her lab done on 03/15/22 , TSH was 1.890 and LDL was 126 mg/dl. Currently,she c/o right flank pain that has being going on for 2 weeks, dysuria, urinary frequency, and gross hematuria.  She states that hematuria resolved this morning, but she continues to experience 6/10 pain to her right flank.  She states that she has a history of renal cyst which may be causing the pain.  She denies fever and chills.  Her urine dipstick was checked during visit, was negative for blood, protein, nitrite, leukocyte, ketone, glucose, bilirubin and it was clear.  Overall, she states that she is doing well and offers no further complaint  Review of Systems  Constitutional: Negative.   Respiratory: Negative.    Cardiovascular: Negative.   Genitourinary:  Positive for dysuria, flank pain, frequency, hematuria and urgency.  Neurological: Negative.       Objective:     BP (!) 145/81 (BP Location: Left Arm, Patient Position: Sitting, Cuff Size: Normal)   Pulse 77   Temp 98.3 F (36.8 C) (Oral)   Resp 16   Ht 5\' 1"  (1.549 m)   Wt 151 lb 11.2 oz (68.8 kg)   SpO2 97%   BMI 28.66 kg/m  BP Readings from Last 3 Encounters:  03/22/22 (!) 145/81  02/01/22 (!) 161/89  01/23/22 (!) 155/76   Wt Readings from Last 3 Encounters:  03/22/22 151 lb 11.2 oz (68.8 kg)  02/01/22 154 lb 8 oz (70.1 kg)  01/23/22 146 lb (66.2 kg)      Physical Exam HENT:     Mouth/Throat:     Mouth: Mucous membranes are moist.  Cardiovascular:     Rate and Rhythm: Normal rate and regular rhythm.     Pulses:  Normal pulses.     Heart sounds: Normal heart sounds.  Pulmonary:     Effort: Pulmonary effort is normal.     Breath sounds: Normal breath sounds.  Abdominal:     Tenderness: There is right CVA tenderness.  Neurological:     General: No focal deficit present.     Mental Status: She is alert and oriented to person, place, and time. Mental status is at baseline.  Psychiatric:        Mood and Affect: Mood normal.        Behavior: Behavior normal.        Thought Content: Thought content normal.        Judgment: Judgment normal.      No results found for any visits on 03/22/22.  Last CBC Lab Results  Component Value Date   WBC 6.8 01/23/2022   HGB 12.9 01/23/2022   HCT 41.2 01/23/2022   MCV 93.0 01/23/2022   MCH 29.1 01/23/2022   RDW 12.7 01/23/2022   PLT 244 01/23/2022   Last metabolic panel Lab Results  Component Value Date   GLUCOSE 212 (H) 01/23/2022   NA 138 01/23/2022   K 3.8 01/23/2022   CL 105 01/23/2022   CO2 24 01/23/2022   BUN 12 01/23/2022  CREATININE 0.76 01/23/2022   GFRNONAA >60 01/23/2022   CALCIUM 9.5 01/23/2022   PROT 6.8 07/28/2020   ALBUMIN 4.4 07/28/2020   LABGLOB 2.4 07/28/2020   AGRATIO 1.8 07/28/2020   BILITOT 0.3 07/28/2020   ALKPHOS 108 07/28/2020   AST 28 07/28/2020   ALT 37 (H) 07/28/2020   ANIONGAP 9 01/23/2022   Last lipids Lab Results  Component Value Date   CHOL 210 (H) 03/15/2022   HDL 63 03/15/2022   LDLCALC 126 (H) 03/15/2022   TRIG 118 03/15/2022   CHOLHDL 3.3 03/15/2022   Last hemoglobin A1c Lab Results  Component Value Date   HGBA1C 6.4 (A) 02/01/2022   Last thyroid functions Lab Results  Component Value Date   TSH 1.890 03/15/2022   T4TOTAL 7.5 07/28/2020      The 10-year ASCVD risk score (Arnett DK, et al., 2019) is: 13.9%    Assessment & Plan:   1. Hypothyroidism, unspecified type -Her TSH was euthyroid and she will continue current medication.   2. Right flank pain -She has a history of renal  cyst and pain to right flank with light palpation, she will follow-up with the urologist.-Urine dipstick checked was normal but we will still send out the urine for culture.  She was encouraged to increase water intake - Ambulatory referral to Urology - Urinalysis, dipstick only; Future - UA/M w/rflx Culture, Routine - Urinalysis, dipstick only - Urinalysis, dipstick only; Future - Urinalysis, dipstick only  3. Hyperlipidemia, unspecified hyperlipidemia type -She will continue on current medication, low-fat low-cholesterol diet and exercise as tolerated.    Return in about 6 weeks (around 05/03/2022), or if symptoms worsen or fail to improve.    Giorgi Debruin Trellis Paganini, NP

## 2022-03-23 LAB — UA/M W/RFLX CULTURE, ROUTINE
Bilirubin, UA: NEGATIVE
Glucose, UA: NEGATIVE
Ketones, UA: NEGATIVE
Leukocytes,UA: NEGATIVE
Nitrite, UA: NEGATIVE
Protein,UA: NEGATIVE
RBC, UA: NEGATIVE
Specific Gravity, UA: 1.009 (ref 1.005–1.030)
Urobilinogen, Ur: 0.2 mg/dL (ref 0.2–1.0)
pH, UA: 6 (ref 5.0–7.5)

## 2022-03-23 LAB — MICROSCOPIC EXAMINATION
Bacteria, UA: NONE SEEN
Casts: NONE SEEN /lpf
RBC, Urine: NONE SEEN /hpf (ref 0–2)

## 2022-03-29 ENCOUNTER — Ambulatory Visit: Payer: No Typology Code available for payment source | Admitting: Podiatry

## 2022-03-30 ENCOUNTER — Encounter: Payer: Self-pay | Admitting: *Deleted

## 2022-04-03 ENCOUNTER — Encounter: Payer: Self-pay | Admitting: Urology

## 2022-04-03 ENCOUNTER — Ambulatory Visit (INDEPENDENT_AMBULATORY_CARE_PROVIDER_SITE_OTHER): Payer: Self-pay | Admitting: Urology

## 2022-04-03 VITALS — BP 126/75 | HR 92 | Ht 62.0 in | Wt 148.0 lb

## 2022-04-03 DIAGNOSIS — R310 Gross hematuria: Secondary | ICD-10-CM

## 2022-04-03 DIAGNOSIS — R109 Unspecified abdominal pain: Secondary | ICD-10-CM

## 2022-04-03 DIAGNOSIS — N281 Cyst of kidney, acquired: Secondary | ICD-10-CM

## 2022-04-03 LAB — URINALYSIS, COMPLETE
Bilirubin, UA: NEGATIVE
Glucose, UA: NEGATIVE
Ketones, UA: NEGATIVE
Leukocytes,UA: NEGATIVE
Nitrite, UA: NEGATIVE
Protein,UA: NEGATIVE
RBC, UA: NEGATIVE
Specific Gravity, UA: 1.015 (ref 1.005–1.030)
Urobilinogen, Ur: 0.2 mg/dL (ref 0.2–1.0)
pH, UA: 6 (ref 5.0–7.5)

## 2022-04-03 LAB — MICROSCOPIC EXAMINATION: Epithelial Cells (non renal): >10 /hpf — AB (ref 0–10)

## 2022-04-03 NOTE — Progress Notes (Signed)
04/03/2022 6:03 PM   Amy Knapp 03/12/1956 [id number]  Referring provider: Langston Reusing, NP [address] [place],  [place] [address]  Chief Complaint  Patient presents with   Other    HPI: 66 y.o. female who was given an appointment as a new patient for evaluation of flank pain however was seen by Dr. Diamantina Providence in October 2022 for gross hematuria and renal cysts.  Cystoscopy performed 08/16/2021 was unremarkable.  Prior CT abdomen pelvis with contrast showed bilateral renal cyst 1 felt to be minimally complex.  MRI abdomen performed 09/2021 showed bilateral simple renal cyst largest in the right kidney measuring 8.4 cm  Complaining of right flank pain for ~ 1.5 months ~ 2 weeks ago she had a recurrent episode of total gross painless hematuria Hematuria has resolved no flank pain has persisted   PMH: Past Medical History:  Diagnosis Date   Allergy    Anxiety    Arthritis    Asthma    Broken leg 2007   Right   Cataract    Complex renal cyst 06/24/2021   COPD (chronic obstructive pulmonary disease) (HCC)    Depression    Diabetes mellitus without complication (HCC)    Emphysema of lung ([place])    Essential hypertension 04/20/2021   GERD (gastroesophageal reflux disease)    Gout    History of hiatal hernia    Hyperlipidemia    Hypothyroidism    Sleep apnea    Thyroid disease     Surgical History: Past Surgical History:  Procedure Laterality Date   ABDOMINAL HYSTERECTOMY  2000   Partial   COLONOSCOPY WITH PROPOFOL N/A 04/11/2021   Procedure: COLONOSCOPY WITH PROPOFOL;  Surgeon: Lucilla Lame, MD;  Location: [facility] ENDOSCOPY;  Service: Endoscopy;  Laterality: N/A;   ESOPHAGOGASTRODUODENOSCOPY N/A 04/11/2021   Procedure: ESOPHAGOGASTRODUODENOSCOPY (EGD);  Surgeon: Lucilla Lame, MD;  Location: [facility] ENDOSCOPY;  Service: Endoscopy;  Laterality: N/A;   INSERTION OF MESH  08/29/2021   Procedure: INSERTION OF MESH;  Surgeon: Jules Husbands, MD;   Location: [facility] ORS;  Service: General;;   LEG SURGERY Right 10/08/2005   broken right lower leg; plate and screws   PATELLA FRACTURE SURGERY Right    TUBAL LIGATION     XI ROBOTIC ASSISTED HIATAL HERNIA REPAIR N/A 08/29/2021   Procedure: XI ROBOTIC [place], RNFA to assist;  Surgeon: Jules Husbands, MD;  Location: [facility] ORS;  Service: General;  Laterality: N/A;    Home Medications:  Allergies as of 04/03/2022       Reactions   Celery Oil    itching   Demerol [meperidine]    Burning, feels hot   Morphine And Related Other (See Comments)   Muscle Spasms   Penicillins    blisters        Medication List        Accurate as of April 03, 2022  6:03 PM. If you have any questions, ask your nurse or doctor.          STOP taking these medications    ibuprofen 600 MG tablet Commonly known as: ADVIL Stopped by: Abbie Sons, MD       TAKE these medications    albuterol 108 (90 Base) MCG/ACT inhaler Commonly known as: Proventil HFA Inhale 2 puffs into the lungs once every 6 (six) hours as needed for wheezing or shortness of breath.   allopurinol 100 MG tablet Commonly known as: ZYLOPRIM TAKE ONE TABLET BY  MOUTH EVERY DAY   atorvastatin 10 MG tablet Commonly known as: LIPITOR TAKE ONE TABLET BY MOUTH ONCE EVERY DAY.   gabapentin 400 MG capsule Commonly known as: NEURONTIN Take 1 capsule (400 mg total) by mouth 3 (three) times daily.   levothyroxine 100 MCG tablet Commonly known as: SYNTHROID TAKE ONE TABLET BY MOUTH ONCE DAILY 45 MINUTES BEFORE BREAKFAST.   losartan 25 MG tablet Commonly known as: COZAAR Take 1 tablet (25 mg total) by mouth once daily.   montelukast 10 MG tablet Commonly known as: SINGULAIR TAKE ONE TABLET BY MOUTH ONCE NIGHTLY AT BEDTIME.   oxyCODONE 5 MG immediate release tablet Commonly known as: Oxy IR/ROXICODONE Take 1-2 tablets (5-10 mg total) by mouth every 4 (four) hours as needed for moderate pain.    Rightest GM550 Blood Glucose w/Device Kit Dispense based on patient and insurance preference. Use up to four times daily as directed. (FOR ICD-9 250.00, 250.01).   traZODone 50 MG tablet Commonly known as: DESYREL Take 3 tablets (150 mg total) by mouth once daily at bedtime.        Allergies:  Allergies  Allergen Reactions   Celery Oil     itching   Demerol [Meperidine]     Burning, feels hot   Morphine And Related Other (See Comments)    Muscle Spasms   Penicillins     blisters    Family History: Family History  Problem Relation Age of Onset   Cancer Mother    Heart attack Father    Cancer Brother        colon   Heart attack Brother 34   Diabetes Daughter    Diabetes Son    Hypertension Maternal Aunt    Breast cancer Neg Hx     Social History:  reports that she quit smoking about 2 months ago. Her smoking use included cigarettes. She smoked an average of .15 packs per day. She quit smokeless tobacco use about 50 years ago. She reports that she does not drink alcohol and does not use drugs.   Physical Exam: BP 126/75   Pulse 92   Ht _0  (1.575 m)   Wt 148 lb (67.1 kg)   BMI 27.07 kg/m   Constitutional:  Alert and oriented, No acute distress. HEENT: [place] AT Respiratory: Normal respiratory effort, no increased work of breathing. Psychiatric: Normal mood and affect.   Assessment & Plan:   66 y.o. female with 1.27-monthhistory of right flank pain and recent episode of gross hematuria Recent negative cystoscopy and urine cytology Schedule follow-up CT urogram for further evaluation Repeat cystoscopy if gross hematuria persists   SAbbie Sons MD  [address] [place] [phone]

## 2022-04-05 ENCOUNTER — Other Ambulatory Visit: Payer: Self-pay

## 2022-04-05 ENCOUNTER — Ambulatory Visit: Payer: Self-pay | Admitting: Gerontology

## 2022-04-05 ENCOUNTER — Ambulatory Visit: Payer: Self-pay | Admitting: Licensed Clinical Social Worker

## 2022-04-05 ENCOUNTER — Encounter: Payer: Self-pay | Admitting: Gerontology

## 2022-04-05 VITALS — BP 151/84 | HR 79 | Temp 98.0°F | Resp 18 | Ht 62.0 in | Wt 153.0 lb

## 2022-04-05 DIAGNOSIS — J3489 Other specified disorders of nose and nasal sinuses: Secondary | ICD-10-CM | POA: Insufficient documentation

## 2022-04-05 DIAGNOSIS — R051 Acute cough: Secondary | ICD-10-CM

## 2022-04-05 DIAGNOSIS — R059 Cough, unspecified: Secondary | ICD-10-CM | POA: Insufficient documentation

## 2022-04-05 DIAGNOSIS — G4700 Insomnia, unspecified: Secondary | ICD-10-CM

## 2022-04-05 DIAGNOSIS — E1140 Type 2 diabetes mellitus with diabetic neuropathy, unspecified: Secondary | ICD-10-CM

## 2022-04-05 DIAGNOSIS — E785 Hyperlipidemia, unspecified: Secondary | ICD-10-CM

## 2022-04-05 DIAGNOSIS — R0981 Nasal congestion: Secondary | ICD-10-CM | POA: Insufficient documentation

## 2022-04-05 DIAGNOSIS — J449 Chronic obstructive pulmonary disease, unspecified: Secondary | ICD-10-CM

## 2022-04-05 MED ORDER — GABAPENTIN 400 MG PO CAPS
400.0000 mg | ORAL_CAPSULE | Freq: Three times a day (TID) | ORAL | 0 refills | Status: DC
  Filled 2022-04-05: qty 270, 90d supply, fill #0

## 2022-04-05 MED ORDER — ATORVASTATIN CALCIUM 10 MG PO TABS
ORAL_TABLET | Freq: Every day | ORAL | 0 refills | Status: DC
  Filled 2022-04-05: qty 30, 30d supply, fill #0

## 2022-04-05 MED ORDER — GUAIFENESIN 100 MG/5ML PO LIQD
100.0000 mg | ORAL | 0 refills | Status: DC | PRN
  Filled 2022-04-05: qty 240, 8d supply, fill #0

## 2022-04-05 MED ORDER — TRAZODONE HCL 50 MG PO TABS
150.0000 mg | ORAL_TABLET | Freq: Every day | ORAL | 0 refills | Status: DC
  Filled 2022-04-05: qty 90, 30d supply, fill #0

## 2022-04-05 MED ORDER — FLUTICASONE PROPIONATE 50 MCG/ACT NA SUSP
1.0000 | Freq: Every day | NASAL | 0 refills | Status: DC
  Filled 2022-04-05: qty 16, 30d supply, fill #0

## 2022-04-05 MED ORDER — COMPACT SPACE CHAMBER DEVI
0 refills | Status: AC
  Filled 2022-04-05: qty 1, 1d supply, fill #0

## 2022-04-05 MED ORDER — MONTELUKAST SODIUM 10 MG PO TABS
ORAL_TABLET | Freq: Every day | ORAL | 1 refills | Status: DC
  Filled 2022-04-05: qty 30, 30d supply, fill #0
  Filled 2022-05-15: qty 30, 30d supply, fill #1

## 2022-04-05 MED ORDER — ALBUTEROL SULFATE HFA 108 (90 BASE) MCG/ACT IN AERS
2.0000 | INHALATION_SPRAY | Freq: Four times a day (QID) | RESPIRATORY_TRACT | 0 refills | Status: DC | PRN
  Filled 2022-04-05: qty 6.7, 25d supply, fill #0

## 2022-04-05 MED ORDER — BENZONATATE 100 MG PO CAPS
100.0000 mg | ORAL_CAPSULE | Freq: Three times a day (TID) | ORAL | 0 refills | Status: DC | PRN
  Filled 2022-04-05: qty 20, 7d supply, fill #0

## 2022-04-05 NOTE — Progress Notes (Signed)
Established Patient Office Visit  Subjective   Patient ID: Amy Knapp, female    DOB: 09-12-1956  Age: 66 y.o. MRN: [id number]  Chief Complaint  Patient presents with   Cough   Nasal Congestion   chest congestion    Patient c/o cough, runny nose and chest congestion x 2 days. No fever. Patient has not taken any OTC medications. Patient has not taken a covid test.    HPI  Amy Knapp  is a 66 y/o female with a history of Allergy, Anxiety, Arthritis, Asthma, COPD, Depression, Diabetes  who presents for c/o productive cough with clear to yellowish phlegm, nasal congestion with rhinorrhea and chest congestion after a sick contact. She states that symptoms started 2 days ago, cough wakes her up at night, denies fever and chills. She reports that she has not taking any otc medications. Overall, she states that she's doing well and offers no further complaint.    Review of Systems  Constitutional: Negative.   HENT:  Positive for congestion. Negative for sinus pain.   Respiratory:  Positive for cough.   Cardiovascular: Negative.   Skin: Negative.   Neurological: Negative.   Psychiatric/Behavioral: Negative.        Objective:     BP (!) 151/84 (BP Location: Right Arm, Patient Position: Sitting, Cuff Size: Large)   Pulse 79   Temp 98 F (36.7 C) (Oral)   Resp 18   Ht _0  (1.575 m)   Wt 153 lb (69.4 kg)   SpO2 92%   BMI 27.98 kg/m  BP Readings from Last 3 Encounters:  04/05/22 (!) 151/84  04/03/22 126/75  03/22/22 (!) 145/81   Wt Readings from Last 3 Encounters:  04/05/22 153 lb (69.4 kg)  04/03/22 148 lb (67.1 kg)  03/22/22 151 lb 11.2 oz (68.8 kg)      Physical Exam HENT:     Head: Normocephalic and atraumatic.     Nose: Nose normal.     Mouth/Throat:     Mouth: Mucous membranes are moist.  Cardiovascular:     Rate and Rhythm: Normal rate and regular rhythm.     Pulses: Normal pulses.     Heart sounds: Normal heart sounds.   Pulmonary:     Effort: Pulmonary effort is normal.     Breath sounds: Normal breath sounds.  Skin:    General: Skin is warm.  Neurological:     General: No focal deficit present.     Mental Status: She is alert and oriented to person, place, and time. Mental status is at baseline.  Psychiatric:        Mood and Affect: Mood normal.        Behavior: Behavior normal.        Thought Content: Thought content normal.        Judgment: Judgment normal.      No results found for any visits on 04/05/22.  Last CBC Lab Results  Component Value Date   WBC 6.8 01/23/2022   HGB 12.9 01/23/2022   HCT 41.2 01/23/2022   MCV 93.0 01/23/2022   MCH 29.1 01/23/2022   RDW 12.7 01/23/2022   PLT 244 63/14/9702   Last metabolic panel Lab Results  Component Value Date   GLUCOSE 212 (H) 01/23/2022   NA 138 01/23/2022   K 3.8 01/23/2022   CL 105 01/23/2022   CO2 24 01/23/2022   BUN 12 01/23/2022   CREATININE 0.76 01/23/2022   GFRNONAA >60 01/23/2022  CALCIUM 9.5 01/23/2022   PROT 6.8 07/28/2020   ALBUMIN 4.4 07/28/2020   LABGLOB 2.4 07/28/2020   AGRATIO 1.8 07/28/2020   BILITOT 0.3 07/28/2020   ALKPHOS 108 07/28/2020   AST 28 07/28/2020   ALT 37 (H) 07/28/2020   ANIONGAP 9 01/23/2022   Last lipids Lab Results  Component Value Date   CHOL 210 (H) 03/15/2022   HDL 63 03/15/2022   LDLCALC 126 (H) 03/15/2022   TRIG 118 03/15/2022   CHOLHDL 3.3 03/15/2022   Last hemoglobin A1c Lab Results  Component Value Date   HGBA1C 6.4 (A) 02/01/2022   Last thyroid functions Lab Results  Component Value Date   TSH 1.890 03/15/2022   T4TOTAL 7.5 07/28/2020      The 10-year ASCVD risk score (Arnett DK, et al., 2019) is: 27.2%    Assessment & Plan:   1. Hyperlipidemia, unspecified hyperlipidemia type - She will continue current medication, low fat, cholesterol diet and exercise as tolerated. - atorvastatin (LIPITOR) 10 MG tablet; TAKE ONE TABLET BY MOUTH ONCE EVERY DAY.  Dispense:  30 tablet; Refill: 0  2. Type 2 diabetes mellitus with diabetic neuropathy, without long-term current use of insulin ([place]) - She will continue current medication. - Comp Met (CMET); Future - gabapentin (NEURONTIN) 400 MG capsule; Take 1 capsule (400 mg total) by mouth 3 (three) times daily.  Dispense: 270 capsule; Refill: 0 - Comp Met (CMET)  3. Insomnia, unspecified type - Her sleeping has improved, she will continue current medication. - traZODone (DESYREL) 50 MG tablet; Take 3 tablets (150 mg total) by mouth once daily at bedtime.  Dispense: 90 tablet; Refill: 0  4. Chronic obstructive asthma ([place]) - Her breathing is stable, she will continue current medication. - albuterol (PROVENTIL HFA) 108 (90 Base) MCG/ACT inhaler; Inhale 2 puffs into the lungs once every 6 (six) hours as needed for wheezing or shortness of breath.  Dispense: 6.7 g; Refill: 0 - montelukast (SINGULAIR) 10 MG tablet; TAKE ONE TABLET BY MOUTH ONCE NIGHTLY AT BEDTIME.  Dispense: 30 tablet; Refill: 1  5. Acute cough - Cough might be viral in etiology, was started on Tessalon Perles, and guaifenesin. She was educated on medication side effects and advised to notify clinic. - benzonatate (TESSALON PERLES) 100 MG capsule; Take 1 capsule (100 mg total) by mouth 3 (three) times daily as needed for cough.  Dispense: 20 capsule; Refill: 0 - guaiFENesin (ROBITUSSIN) 100 MG/5ML liquid; Take 5 mLs (100 mg total) by mouth every 4 (four) hours as needed for cough or to loosen phlegm.  Dispense: 240 mL; Refill: 0  6. Nasal congestion with rhinorrhea - She was started on Flonase, educated on medication side effects and advised of notify clinic. - fluticasone (FLONASE) 50 MCG/ACT nasal spray; Place 1 spray into both nostrils daily.  Dispense: 16 g; Refill: 0 She was advised to go to the ED for worsening symptoms.   Return in about 4 weeks (around 05/03/2022), or if symptoms worsen or fail to improve.    Teodor Prater Jerold Coombe, NP

## 2022-04-05 NOTE — Patient Instructions (Signed)

## 2022-04-06 LAB — COMPREHENSIVE METABOLIC PANEL WITH GFR
ALT: 25 [IU]/L (ref 0–32)
AST: 33 [IU]/L (ref 0–40)
Albumin/Globulin Ratio: 1.5 (ref 1.2–2.2)
Albumin: 4 g/dL (ref 3.8–4.8)
Alkaline Phosphatase: 95 [IU]/L (ref 44–121)
BUN/Creatinine Ratio: 9 — ABNORMAL LOW (ref 12–28)
BUN: 7 mg/dL — ABNORMAL LOW (ref 8–27)
Bilirubin Total: <0.2 mg/dL (ref 0.0–1.2)
CO2: 24 mmol/L (ref 20–29)
Calcium: 9.5 mg/dL (ref 8.7–10.3)
Chloride: 102 mmol/L (ref 96–106)
Creatinine, Ser: 0.76 mg/dL (ref 0.57–1.00)
Globulin, Total: 2.7 g/dL (ref 1.5–4.5)
Glucose: 205 mg/dL — ABNORMAL HIGH (ref 70–99)
Potassium: 4.1 mmol/L (ref 3.5–5.2)
Sodium: 138 mmol/L (ref 134–144)
Total Protein: 6.7 g/dL (ref 6.0–8.5)
eGFR: 88 mL/min/{1.73_m2}

## 2022-04-08 ENCOUNTER — Encounter: Payer: Self-pay | Admitting: Urology

## 2022-04-11 ENCOUNTER — Other Ambulatory Visit: Payer: Self-pay

## 2022-04-12 ENCOUNTER — Other Ambulatory Visit: Payer: Self-pay | Admitting: Gerontology

## 2022-04-12 ENCOUNTER — Other Ambulatory Visit: Payer: Self-pay

## 2022-04-12 DIAGNOSIS — E785 Hyperlipidemia, unspecified: Secondary | ICD-10-CM

## 2022-04-12 MED ORDER — ATORVASTATIN CALCIUM 10 MG PO TABS
ORAL_TABLET | Freq: Every day | ORAL | 0 refills | Status: DC
  Filled 2022-04-12: qty 30, fill #0
  Filled 2022-05-15: qty 30, 30d supply, fill #0

## 2022-04-13 ENCOUNTER — Other Ambulatory Visit: Payer: Self-pay

## 2022-04-27 ENCOUNTER — Ambulatory Visit
Admission: RE | Admit: 2022-04-27 | Discharge: 2022-04-27 | Disposition: A | Discharge status: Home / Self Care | Payer: Self-pay | Source: Ambulatory Visit | Attending: Urology | Admitting: Urology

## 2022-04-27 DIAGNOSIS — R310 Gross hematuria: Secondary | ICD-10-CM | POA: Insufficient documentation

## 2022-04-27 DIAGNOSIS — R109 Unspecified abdominal pain: Secondary | ICD-10-CM | POA: Insufficient documentation

## 2022-04-27 MED ORDER — IOHEXOL 300 MG/ML  SOLN
125.0000 mL | Freq: Once | INTRAMUSCULAR | Status: AC | PRN
  Administered 2022-04-27: 125 mL via INTRAVENOUS

## 2022-04-30 ENCOUNTER — Telehealth: Payer: Self-pay | Admitting: *Deleted

## 2022-04-30 NOTE — Telephone Encounter (Signed)
-----   Message from Riki Altes, MD sent at 04/30/2022  7:37 AM EDT ----- CT urogram showed no urinary tract calculi.  Large renal cyst stable.  No findings that would explain prior blood in the urine.  Recommend repeat cystoscopy should she have recurrent gross hematuria.

## 2022-04-30 NOTE — Telephone Encounter (Signed)
Notified patient as instructed, patient pleased. Discussed follow-up appointments, patient agrees  

## 2022-05-02 ENCOUNTER — Ambulatory Visit: Payer: Self-pay | Admitting: Gerontology

## 2022-05-10 ENCOUNTER — Other Ambulatory Visit: Payer: Self-pay

## 2022-05-10 ENCOUNTER — Ambulatory Visit: Payer: Self-pay | Admitting: Family Medicine

## 2022-05-10 VITALS — BP 147/85 | HR 71 | Temp 98.9°F | Ht 62.0 in | Wt 152.5 lb

## 2022-05-10 DIAGNOSIS — E785 Hyperlipidemia, unspecified: Secondary | ICD-10-CM

## 2022-05-10 DIAGNOSIS — I10 Essential (primary) hypertension: Secondary | ICD-10-CM

## 2022-05-10 DIAGNOSIS — E039 Hypothyroidism, unspecified: Secondary | ICD-10-CM

## 2022-05-10 DIAGNOSIS — E1140 Type 2 diabetes mellitus with diabetic neuropathy, unspecified: Secondary | ICD-10-CM

## 2022-05-10 MED ORDER — PROPRANOLOL HCL ER 60 MG PO CP24
60.0000 mg | ORAL_CAPSULE | Freq: Every day | ORAL | 1 refills | Status: DC
  Filled 2022-05-10: qty 30, 30d supply, fill #0

## 2022-05-10 NOTE — Patient Instructions (Signed)
Start Propranolol 60 mg once daily.  Monitor your BP and pulse on the medication and let us know how you're doing.   Remain on all other medications.

## 2022-05-10 NOTE — Progress Notes (Signed)
Established Patient Office Visit  Subjective   Patient ID: Amy Knapp, female    DOB: [birth year]  Age: 66 y.o. MRN: [id number]  Chief Complaint  Patient presents with   Follow-up    New headache every other day for the last 3 months. Tylenol doesn't help.        HPI:    Pt's main com[plaint today is a 6 month history of headaches.  Occur off and on for no reason.  At times, may be one sided and with nausea, other times not.  Headaches seem to be worse with stress, which she says is worse due to family demands and losing her job a couple months ago (though job caused stress as well).  Pt has well controlled DM.  BS's usually runs 120-140's.  Last A1c was 6.4 on 02/01/22.  No increased thirst or urination.  She has neuropathy, presumably due to DM.  Is on Gabapentin 400 mg 3 times a day, which she thinks helps.  Pt has HTN. Is on Losartan daily.  BP usually runs 130's/70's. No chest pain, SOB or dizziness. Is on Lipitor daily.  Lipids checked 2 months ago showed fairly good lipid control  Past Medical History:  Diagnosis Date   Allergy    Anxiety    Arthritis    Asthma    Broken leg 2007   Right   Cataract    Complex renal cyst 06/24/2021   COPD (chronic obstructive pulmonary disease) (HCC)    Depression    Diabetes mellitus without complication (HCC)    Emphysema of lung (HCC)    Essential hypertension 04/20/2021   GERD (gastroesophageal reflux disease)    Gout    History of hiatal hernia    Hyperlipidemia    Hypothyroidism    Sleep apnea    Thyroid disease     ROS    Objective:     BP (!) 147/85 (BP Location: Left Arm, Patient Position: Sitting, Cuff Size: Normal)   Pulse 71   Temp 98.9 F (37.2 C) (Oral)   Ht 5\' 2"  (1.575 m)   Wt 152 lb 8 oz (69.2 kg)   SpO2 93%   BMI 27.89 kg/m  BP Readings from Last 3 Encounters:  05/10/22 (!) 147/85  04/05/22 (!) 151/84  04/03/22 126/75   Wt Readings from Last 3 Encounters:  05/10/22 152 lb 8 oz (69.2 kg)   04/05/22 153 lb (69.4 kg)  04/03/22 148 lb (67.1 kg)      Physical Exam Conj clear, OP clear CTA RRR NT/ND +2 pulses, no edema.  No results found for any visits on 05/10/22.    The 10-year ASCVD risk score (Arnett DK, et al., 2019) is: 14.2%    Assessment & Plan:   Problem List Items Addressed This Visit    Headaches.  Likely stress-related.  May be migraine component.  Start Propranolol EWR 60 mg daily and see if headache frequency improves.  Needs to monitor BP and pulse after starting it.    Cardiovascular and Mediastinum   Essential hypertension - Primary.  Stay on Losartan daily as well.   Relevant Medications   propranolol ER (INDERAL LA) 60 MG 24 hr capsule     Endocrine   Hypothyroidism.  stay on Synthroid 100 u daily.   Relevant Medications   propranolol ER (INDERAL LA) 60 MG 24 hr capsule   Type 2 diabetes mellitus with diabetic neuropathy, unspecified (HCC)     Other   Hyperlipidemia.  Stay on Lipitor 10 mg daily   Relevant Medications   propranolol ER (INDERAL LA) 60 MG 24 hr capsule    Return in about 2 months (around 07/10/2022).    Dorothey Baseman, MD

## 2022-05-11 ENCOUNTER — Other Ambulatory Visit: Payer: Self-pay

## 2022-05-15 ENCOUNTER — Other Ambulatory Visit: Payer: Self-pay | Admitting: Gerontology

## 2022-05-15 ENCOUNTER — Other Ambulatory Visit: Payer: Self-pay

## 2022-05-15 DIAGNOSIS — J449 Chronic obstructive pulmonary disease, unspecified: Secondary | ICD-10-CM

## 2022-05-15 DIAGNOSIS — I10 Essential (primary) hypertension: Secondary | ICD-10-CM

## 2022-05-15 MED ORDER — ALBUTEROL SULFATE HFA 108 (90 BASE) MCG/ACT IN AERS
2.0000 | INHALATION_SPRAY | Freq: Four times a day (QID) | RESPIRATORY_TRACT | 0 refills | Status: DC | PRN
  Filled 2022-05-15: qty 6.7, 25d supply, fill #0

## 2022-05-15 MED ORDER — LOSARTAN POTASSIUM 25 MG PO TABS
25.0000 mg | ORAL_TABLET | Freq: Every day | ORAL | 2 refills | Status: DC
  Filled 2022-05-15: qty 30, 30d supply, fill #0
  Filled 2022-06-19: qty 30, 30d supply, fill #1

## 2022-05-30 ENCOUNTER — Ambulatory Visit: Payer: Self-pay | Admitting: Licensed Clinical Social Worker

## 2022-05-30 ENCOUNTER — Other Ambulatory Visit: Payer: Self-pay

## 2022-05-30 ENCOUNTER — Other Ambulatory Visit: Payer: Self-pay | Admitting: Gerontology

## 2022-05-30 DIAGNOSIS — G4700 Insomnia, unspecified: Secondary | ICD-10-CM

## 2022-05-30 DIAGNOSIS — F4310 Post-traumatic stress disorder, unspecified: Secondary | ICD-10-CM

## 2022-05-30 DIAGNOSIS — F4323 Adjustment disorder with mixed anxiety and depressed mood: Secondary | ICD-10-CM

## 2022-05-30 NOTE — BH Specialist Note (Signed)
Integrated Behavioral Health Follow Up In-Person Visit  MRN: [id number] Name: Amy Knapp  Number of [place] Clinician visits: No data recorded Session Start time: No data recorded  Session End time: No data recorded Total time in minutes: No data recorded  Types of Service: Individual psychotherapy  Interpretor:No. Interpretor Name and Language: N/A  Subjective: Amy Knapp is a 66 y.o. female accompanied by  herself Patient was referred by [name], NP for mental health. Patient reports the following symptoms/concerns: The patient reports that she has been doing okay since her last follow-up appointment.  Duration of problem: ears; Severity of problem: moderate  Objective: Mood: Irritable and Affect: Appropriate Risk of harm to self or others: No plan to harm self or others  Life Context: Family and Social: see above School/Work: see above Self-Care: see above Life Changes: see above  Patient and/or Family's Strengths/Protective Factors: Social connections, Concrete supports in place (healthy food, safe environments, etc.), and Sense of purpose  Goals Addressed: Patient will:  Reduce symptoms of: anxiety, depression, insomnia, and stress   Increase knowledge and/or ability of: coping skills, healthy habits, self-management skills, and stress reduction   Demonstrate ability to: Increase healthy adjustment to current life circumstances and Increase adequate support systems for patient/family  Progress towards Goals: Ongoing  Interventions: Interventions utilized:  CBT Cognitive Behavioral Therapywas utilized by the clinician during today's follow up session. Clinician met with patient to identify needs related to stressors and functioning, and assess and monitor for signs and symptoms of anxiety and depression, and assess safety. The clinician processed with the patient how they have been doing since the last follow-up  session.  Standardized Assessments completed: GAD-7 and PHQ 9 GAD-7 = 18 PHQ-9 = 15   Assessment: Patient currently experiencing see above.   Patient may benefit from see above.  Plan: Follow up with behavioral health clinician on : 06/07/2022 at 5:00 PM  Behavioral recommendations:  Referral(s): [place] (In Clinic) "From scale of 1-10, how likely are you to follow plan?":   Lesli Albee, LCSWA

## 2022-06-01 ENCOUNTER — Encounter: Payer: Self-pay | Admitting: Urology

## 2022-06-01 ENCOUNTER — Ambulatory Visit (INDEPENDENT_AMBULATORY_CARE_PROVIDER_SITE_OTHER): Payer: Self-pay | Admitting: Urology

## 2022-06-01 ENCOUNTER — Other Ambulatory Visit: Payer: Self-pay

## 2022-06-01 ENCOUNTER — Other Ambulatory Visit: Payer: Self-pay | Admitting: Gerontology

## 2022-06-01 VITALS — BP 154/83 | HR 63 | Ht 62.0 in | Wt 151.0 lb

## 2022-06-01 DIAGNOSIS — R310 Gross hematuria: Secondary | ICD-10-CM

## 2022-06-01 DIAGNOSIS — G4700 Insomnia, unspecified: Secondary | ICD-10-CM

## 2022-06-01 DIAGNOSIS — N281 Cyst of kidney, acquired: Secondary | ICD-10-CM

## 2022-06-01 LAB — URINALYSIS, COMPLETE
Bilirubin, UA: NEGATIVE
Glucose, UA: NEGATIVE
Ketones, UA: NEGATIVE
Nitrite, UA: NEGATIVE
Protein,UA: NEGATIVE
RBC, UA: NEGATIVE
Specific Gravity, UA: 1.015 (ref 1.005–1.030)
Urobilinogen, Ur: 0.2 mg/dL (ref 0.2–1.0)
pH, UA: 5.5 (ref 5.0–7.5)

## 2022-06-01 LAB — MICROSCOPIC EXAMINATION

## 2022-06-01 NOTE — Addendum Note (Signed)
Addended by: Honor Loh on: 06/01/2022 09:55 AM   Modules accepted: Orders

## 2022-06-01 NOTE — Progress Notes (Signed)
   06/01/22  CC:  Chief Complaint  Patient presents with   Cysto    HPI: Refer to prior note 04/03/2022.  Denies recurrent gross hematuria since last visit.  CTU showed a stable large right renal cyst and no other upper tract abnormalities  Blood pressure (!) 154/83, pulse 63, height 5\' 2"  (1.575 m), weight 151 lb (68.5 kg). 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  Lidocaine jelly was administered per urethral meatus.    Procedure: - Flexible cystoscope introduced, without any difficulty.   - Thorough search of the bladder revealed:    normal urethral meatus    normal urothelium    no stones    no ulcers     no tumors    no urethral polyps    no trabeculation  - Ureteral orifices were normal in position and appearance.  Post-Procedure: - Patient tolerated the procedure well  Assessment/ Plan: Note bladder mucosal abnormalities Large right renal cyst.  She stated today her flank pain is on the right side but is presently mild.  We discussed cyst aspiration by IR could be done if her flank pain worsened though it is difficult to determine if her pain is related to the cyst and could be secondary to non-GU etiologies Urine cytology sent 1 year follow-up with [facility]    , MD

## 2022-06-02 MED FILL — Trazodone HCl Tab 50 MG: ORAL | 30 days supply | Qty: 90 | Fill #0 | Status: AC

## 2022-06-03 ENCOUNTER — Other Ambulatory Visit: Payer: Self-pay

## 2022-06-04 ENCOUNTER — Other Ambulatory Visit: Payer: Self-pay

## 2022-06-05 LAB — CYTOLOGY - NON PAP

## 2022-06-06 ENCOUNTER — Telehealth: Payer: Self-pay | Admitting: *Deleted

## 2022-06-06 NOTE — Telephone Encounter (Signed)
-----   Message from Riki Altes, MD sent at 06/06/2022  7:09 AM EDT ----- Urine cytology showed no cells suspicious for bladder cancer

## 2022-06-06 NOTE — Telephone Encounter (Signed)
Left message per DPR 

## 2022-06-07 ENCOUNTER — Ambulatory Visit: Payer: Self-pay | Admitting: Licensed Clinical Social Worker

## 2022-06-08 ENCOUNTER — Other Ambulatory Visit: Payer: Self-pay

## 2022-06-13 ENCOUNTER — Ambulatory Visit: Payer: Self-pay | Admitting: Licensed Clinical Social Worker

## 2022-06-14 ENCOUNTER — Ambulatory Visit: Payer: Self-pay | Admitting: Licensed Clinical Social Worker

## 2022-06-14 DIAGNOSIS — F4310 Post-traumatic stress disorder, unspecified: Secondary | ICD-10-CM

## 2022-06-14 DIAGNOSIS — F4381 Prolonged grief disorder: Secondary | ICD-10-CM

## 2022-06-14 NOTE — BH Specialist Note (Signed)
Integrated Behavioral Health Follow Up In-Person Visit  MRN: [id number] Name: Amy Knapp  Number of [place] Clinician visits: No data recorded Session Start time: No data recorded  Session End time: No data recorded Total time in minutes: No data recorded  Types of Service: Individual psychotherapy  Interpretor:No. Interpretor Name and Language: N/A  Subjective: Amy Knapp is a 66 y.o. female accompanied by  herself Patient was referred by [name], NP for mental health. Patient reports the following symptoms/concerns: The patient ports that she has been doing okay since her last follow-up session.  She discussed financial, health, and other situational stressors that are impacting her life currently.  Ms. Colonna noted that she has been "giving it to God through prayer" and feels that she is able to move past some of the worries and troubles in her life by remaining positive and not dwelling on them.  The patient discussed the loss of her two sons and finds comfort in helping others such as her daughter-in-law through their grief.  The patient noted that she continues to enjoy volunteering at her church.  Ms. Bureau denied any suicidal or homicidal thoughts. Duration of problem: Years ; Severity of problem: moderate  Objective: Mood: Euthymic and Affect: Appropriate Risk of harm to self or others: No plan to harm self or others  Life Context: Family and Social: see above School/Work: see above Self-Care: see above Life Changes: see above  Patient and/or Family's Strengths/Protective Factors: Social connections, Concrete supports in place (healthy food, safe environments, etc.), Sense of purpose, and Physical Health (exercise, healthy diet, medication compliance, etc.)  Goals Addressed: Patient will:  Reduce symptoms of: agitation, anxiety, depression, insomnia, and stress   Increase knowledge and/or ability of: coping  skills, healthy habits, self-management skills, and stress reduction   Demonstrate ability to: Increase healthy adjustment to current life circumstances  Progress towards Goals: Ongoing  Interventions: Interventions utilized:  Supportive Counseling was utilized by the clinician during today's follow up session. Clinician met with patient to identify needs related to stressors and functioning, and assess and monitor for signs and symptoms of anxiety and depression, and assess safety. The clinician processed with the patient how they have been doing since the last follow-up session.  Clinician measured the patient's anxiety and depression on a numerical scale.  Clinician processed with the patient her progress and look to identify coping strategies that have worked for her over the last few months.  Clinician determined that the patient's faith has been a source of strength in her life.  Clinician encouraged the patient to continue to practice her coping skills daily even during times when she did not feel distressed.  The session ended with scheduling. standardized Assessments completed: GAD-7 and PHQ 9 GAD-7= 8 PHQ-9= 5  Assessment: Patient currently experiencing see above.   Patient may benefit from see above.  Plan: Follow up with behavioral health clinician on : 07/05/2022 at 5:00 PM. Behavioral recommendations:  Referral(s): [place] (In Clinic) "From scale of 1-10, how likely are you to follow plan?":   Lesli Albee, LCSWA

## 2022-06-19 ENCOUNTER — Other Ambulatory Visit: Payer: Self-pay | Admitting: Gerontology

## 2022-06-19 ENCOUNTER — Other Ambulatory Visit: Payer: Self-pay

## 2022-06-19 DIAGNOSIS — E785 Hyperlipidemia, unspecified: Secondary | ICD-10-CM

## 2022-06-19 DIAGNOSIS — J449 Chronic obstructive pulmonary disease, unspecified: Secondary | ICD-10-CM

## 2022-06-19 MED ORDER — ATORVASTATIN CALCIUM 10 MG PO TABS
ORAL_TABLET | Freq: Every day | ORAL | 0 refills | Status: DC
  Filled 2022-06-19: qty 30, 30d supply, fill #0

## 2022-06-19 MED ORDER — MONTELUKAST SODIUM 10 MG PO TABS
ORAL_TABLET | Freq: Every day | ORAL | 1 refills | Status: DC
  Filled 2022-06-19: qty 30, 30d supply, fill #0

## 2022-07-05 ENCOUNTER — Ambulatory Visit: Payer: Self-pay | Admitting: Licensed Clinical Social Worker

## 2022-07-05 DIAGNOSIS — F4310 Post-traumatic stress disorder, unspecified: Secondary | ICD-10-CM

## 2022-07-05 DIAGNOSIS — F4321 Adjustment disorder with depressed mood: Secondary | ICD-10-CM

## 2022-07-05 DIAGNOSIS — G4700 Insomnia, unspecified: Secondary | ICD-10-CM

## 2022-07-05 NOTE — BH Specialist Note (Signed)
Integrated Behavioral Health via Telemedicine Visit  07/05/2022 Amy Knapp [id number]  Number of Integrated Behavioral Health Clinician visits: No data recorded Session Start time: No data recorded  Session End time: No data recorded Total time in minutes: No data recorded  Referring Provider: [name], NP Patient/Family location: The patient's home [facility] Provider location: [facility] [place] All persons participating in visit: Selia M. Zigmund Daniel and Jerrilyn Cairo, LCSW-A Types of Service: Telephone visit  I connected with Amy Knapp via  Telephone or Video Enabled Telemedicine Application  (Video is Caregility application) and verified that I am speaking with the correct person using two identifiers. Discussed confidentiality: Yes   I discussed the limitations of telemedicine and the availability of in person appointments.  Discussed there is a possibility of technology failure and discussed alternative modes of communication if that failure occurs.  Patient and/or legal guardian expressed understanding and consented to Telemedicine visit: Yes   Presenting Concerns: Patient and/or family reports the following symptoms/concerns: The patient reports that she has been doing worse since her last follow-up appointment.  She explained that she did go to [place] to visit her cousin to whom she is very close.  She shared stories of them growing up together and noted that they were closer than sisters.  Ms. Turner shared that her cousin passed away while she held her in her arms a week ago last Tuesday.  The patient discussed how painful this loss has been for her and the loneliness she is now experiencing.  She stated since the loss she is having an increase in difficulty sleeping; falling asleep and remaining asleep.  She noted she has lost her appetite and takes a few bites of food here and there throughout the day.  Ms. Lasseigne discussed having  support from her church family, and friends.  The patient requested to end the session early because she had just gotten back into town and had several errands that she still needed to run.  The patient denied any suicidal or homicidal thoughts. Duration of problem: Years; Severity of problem: moderate  Patient and/or Family's Strengths/Protective Factors: Social connections, Concrete supports in place (healthy food, safe environments, etc.), Sense of purpose, and Physical Health (exercise, healthy diet, medication compliance, etc.)  Goals Addressed: Patient will:  Reduce symptoms of: agitation, anxiety, depression, insomnia, and stress   Increase knowledge and/or ability of: coping skills, healthy habits, self-management skills, and stress reduction   Demonstrate ability to: Increase healthy adjustment to current life circumstances, Increase adequate support systems for patient/family, and Begin healthy grieving over loss  Progress towards Goals: Ongoing  Interventions: Interventions utilized:  CBT Cognitive Behavioral Therapywas utilized by the clinician during today's follow up session. The clinician met with the patient to identify needs related to stressors and functioning, assess and monitor for signs and symptoms of anxiety and depression, and assess safety. The clinician processed with the patient how they had been doing since the last follow-up session. The clinician measured the patient's anxiety and depression on a numerical scale. The clinician offered the patient her condolences regarding her loss. The clinician used active listening, unconditional positive regard, and warm acceptance to help build trust with the client while she expressed feelings of pain and loneliness. The clinician treated the patient empathetically and encouraged her to share the story of her loss. The clinician processed with the patient what helped her get through previous losses in her life and determined that  the patient relied heavily on  her faith during distress. The clinician encouraged the patient to reach out for further support from the staff at [facility] during this difficult time. The session ended with scheduling.  Standardized Assessments completed: GAD-7 and PHQ 9 GAD-7 =13 PHQ-9 =16  Assessment: Patient currently experiencing see above.   Patient may benefit from see above.  Plan: Follow up with behavioral health clinician on : Thursday, July 12, 2022 at 6:00 PM. Behavioral recommendations:  Referral(s): [place] (In Clinic)  I discussed the assessment and treatment plan with the patient and/or parent/guardian. They were provided an opportunity to ask questions and all were answered. They agreed with the plan and demonstrated an understanding of the instructions.   They were advised to call back or seek an in-person evaluation if the symptoms worsen or if the condition fails to improve as anticipated.  Lesli Albee, LCSWA

## 2022-07-10 ENCOUNTER — Other Ambulatory Visit: Payer: Self-pay | Admitting: Gerontology

## 2022-07-10 ENCOUNTER — Other Ambulatory Visit: Payer: Self-pay

## 2022-07-10 DIAGNOSIS — M109 Gout, unspecified: Secondary | ICD-10-CM

## 2022-07-10 DIAGNOSIS — G4700 Insomnia, unspecified: Secondary | ICD-10-CM

## 2022-07-10 MED ORDER — ALLOPURINOL 100 MG PO TABS
ORAL_TABLET | Freq: Every day | ORAL | 1 refills | Status: DC
  Filled 2022-07-10: qty 32, 32d supply, fill #0

## 2022-07-12 ENCOUNTER — Ambulatory Visit: Payer: Self-pay

## 2022-07-12 ENCOUNTER — Ambulatory Visit: Payer: Self-pay | Admitting: Licensed Clinical Social Worker

## 2022-07-12 DIAGNOSIS — F4310 Post-traumatic stress disorder, unspecified: Secondary | ICD-10-CM

## 2022-07-12 NOTE — BH Specialist Note (Signed)
Integrated Behavioral Health via Telemedicine Visit  07/12/2022 Amy Knapp [id number]  Number of Integrated Behavioral Health Clinician visits: No data recorded Session Start time: No data recorded  Session End time: No data recorded Total time in minutes: No data recorded  Referring Provider: [name], NP  Patient/Family location: The patient's home [facility] Provider location: [facility]  All persons participating in visit: Amy Knapp and Amy Cairo, LCSW-A Types of Service: Telephone visit  I connected with Amy Knapp  via  Telephone or Video Enabled Telemedicine Application  (Video is Caregility application) and verified that I am speaking with the correct person using two identifiers. Discussed confidentiality: Yes   I discussed the limitations of telemedicine and the availability of in person appointments.  Discussed there is a possibility of technology failure and discussed alternative modes of communication if that failure occurs.  Patient and/or legal guardian expressed understanding and consented to Telemedicine visit: Yes   Presenting Concerns: Patient and/or family reports the following symptoms/concerns: The patient stated that she has been doing okay since her last follow up appointment. She noted that  has a new third shift job and the sleep transition has been tough for her. The patient discussed several financial and health related stressors impacting her life currently. The patient noted that overall she was doing okay and relying on her faith to help her deal with her current life circumstances. The patient requested to end the session early due to another appointment. Amy Knapp denied any suicidal or homicidal thoughts.  Duration of problem: Years; Severity of problem: moderate  Patient and/or Family's Strengths/Protective Factors: Social connections, Concrete supports in place (healthy food, safe environments,  etc.), Sense of purpose, and Physical Health (exercise, healthy diet, medication compliance, etc.)  Goals Addressed: Patient will:  Reduce symptoms of: agitation, anxiety, depression, insomnia, and stress   Increase knowledge and/or ability of: coping skills, healthy habits, self-management skills, and stress reduction   Demonstrate ability to: Increase healthy adjustment to current life circumstances and Increase adequate support systems for patient/family  Progress towards Goals: Ongoing  Interventions: Interventions utilized:  CBT Cognitive Behavioral Therapywas utilized by the clinician during today's follow up session. Clinician met with patient to identify needs related to stressors and functioning, and assess and monitor for signs and symptoms of anxiety and depression, and assess safety. The clinician processed with the patient how they have been doing since the last follow-up session. The patient requested to end the session early because of another appointment. The session ended with scheduling Standardized Assessments completed:  Due to time  constraints will complete on follow-up.    Assessment: Patient currently experiencing see above.   Patient may benefit from see above.  Plan: Follow up with behavioral health clinician on : 07/19/2022 at 4:00 PM  Behavioral recommendations:  Referral(s): Amy Knapp (In Clinic)  I discussed the assessment and treatment plan with the patient and/or parent/guardian. They were provided an opportunity to ask questions and all were answered. They agreed with the plan and demonstrated an understanding of the instructions.   They were advised to call back or seek an in-person evaluation if the symptoms worsen or if the condition fails to improve as anticipated.  Amy Knapp, LCSWA

## 2022-07-19 ENCOUNTER — Encounter: Payer: Self-pay | Admitting: Licensed Clinical Social Worker

## 2022-07-19 ENCOUNTER — Ambulatory Visit: Payer: Self-pay

## 2022-07-19 ENCOUNTER — Ambulatory Visit: Payer: Self-pay | Admitting: Adult Health

## 2022-07-19 ENCOUNTER — Encounter: Payer: Self-pay | Admitting: Adult Health

## 2022-07-19 ENCOUNTER — Ambulatory Visit: Payer: Self-pay | Admitting: Licensed Clinical Social Worker

## 2022-07-19 ENCOUNTER — Other Ambulatory Visit: Payer: Self-pay

## 2022-07-19 VITALS — BP 134/82 | HR 81 | Temp 98.0°F | Wt 150.7 lb

## 2022-07-19 DIAGNOSIS — E1140 Type 2 diabetes mellitus with diabetic neuropathy, unspecified: Secondary | ICD-10-CM

## 2022-07-19 DIAGNOSIS — G4700 Insomnia, unspecified: Secondary | ICD-10-CM

## 2022-07-19 DIAGNOSIS — F4310 Post-traumatic stress disorder, unspecified: Secondary | ICD-10-CM

## 2022-07-19 DIAGNOSIS — E785 Hyperlipidemia, unspecified: Secondary | ICD-10-CM

## 2022-07-19 DIAGNOSIS — I152 Hypertension secondary to endocrine disorders: Secondary | ICD-10-CM

## 2022-07-19 DIAGNOSIS — J4489 Other specified chronic obstructive pulmonary disease: Secondary | ICD-10-CM

## 2022-07-19 DIAGNOSIS — M109 Gout, unspecified: Secondary | ICD-10-CM

## 2022-07-19 DIAGNOSIS — E039 Hypothyroidism, unspecified: Secondary | ICD-10-CM

## 2022-07-19 DIAGNOSIS — F411 Generalized anxiety disorder: Secondary | ICD-10-CM

## 2022-07-19 DIAGNOSIS — F339 Major depressive disorder, recurrent, unspecified: Secondary | ICD-10-CM

## 2022-07-19 DIAGNOSIS — E1169 Type 2 diabetes mellitus with other specified complication: Secondary | ICD-10-CM

## 2022-07-19 DIAGNOSIS — I10 Essential (primary) hypertension: Secondary | ICD-10-CM

## 2022-07-19 LAB — POCT GLYCOSYLATED HEMOGLOBIN (HGB A1C): Hemoglobin A1C: 6.5 % — AB (ref 4.0–5.6)

## 2022-07-19 LAB — GLUCOSE, POCT (MANUAL RESULT ENTRY): POC Glucose: 94 mg/dl (ref 70–99)

## 2022-07-19 MED ORDER — MONTELUKAST SODIUM 10 MG PO TABS
ORAL_TABLET | Freq: Every day | ORAL | 1 refills | Status: DC
  Filled 2022-07-19: qty 90, 90d supply, fill #0
  Filled 2022-11-05: qty 90, 90d supply, fill #1

## 2022-07-19 MED ORDER — ATORVASTATIN CALCIUM 10 MG PO TABS
ORAL_TABLET | Freq: Every day | ORAL | 1 refills | Status: DC
  Filled 2022-07-19: qty 90, 90d supply, fill #0
  Filled 2022-11-05: qty 90, 90d supply, fill #1

## 2022-07-19 MED ORDER — DULOXETINE HCL 30 MG PO CPEP
30.0000 mg | ORAL_CAPSULE | Freq: Every day | ORAL | 1 refills | Status: DC
  Filled 2022-07-19: qty 90, 90d supply, fill #0

## 2022-07-19 MED ORDER — LEVOTHYROXINE SODIUM 100 MCG PO TABS
ORAL_TABLET | ORAL | 1 refills | Status: DC
  Filled 2022-07-19: qty 90, 90d supply, fill #0
  Filled 2022-11-05: qty 90, 90d supply, fill #1

## 2022-07-19 MED ORDER — ALBUTEROL SULFATE HFA 108 (90 BASE) MCG/ACT IN AERS
2.0000 | INHALATION_SPRAY | Freq: Four times a day (QID) | RESPIRATORY_TRACT | 1 refills | Status: DC | PRN
  Filled 2022-07-19: qty 6.7, 25d supply, fill #0

## 2022-07-19 MED ORDER — GABAPENTIN 400 MG PO CAPS
400.0000 mg | ORAL_CAPSULE | Freq: Three times a day (TID) | ORAL | 2 refills | Status: DC
  Filled 2022-07-19: qty 270, 90d supply, fill #0

## 2022-07-19 MED ORDER — ALLOPURINOL 100 MG PO TABS
ORAL_TABLET | Freq: Every day | ORAL | 1 refills | Status: DC
  Filled 2022-07-19: qty 90, fill #0
  Filled 2022-08-21: qty 90, 90d supply, fill #0

## 2022-07-19 MED ORDER — LOSARTAN POTASSIUM 25 MG PO TABS
25.0000 mg | ORAL_TABLET | Freq: Every day | ORAL | 1 refills | Status: DC
  Filled 2022-07-19: qty 90, 90d supply, fill #0
  Filled 2022-11-05: qty 90, 90d supply, fill #1

## 2022-07-19 MED ORDER — TRAZODONE HCL 50 MG PO TABS
150.0000 mg | ORAL_TABLET | Freq: Every day | ORAL | 1 refills | Status: DC
  Filled 2022-07-19: qty 90, 30d supply, fill #0
  Filled 2022-08-21: qty 90, 30d supply, fill #1

## 2022-07-19 NOTE — BH Specialist Note (Signed)
Integrated Behavioral Health via Telemedicine Visit  07/19/2022 Triston Lisanti [id number]  Number of [place] Clinician visits: No data recorded Session Start time: No data recorded  Session End time: No data recorded Total time in minutes: No data recorded  Referring Provider: [name] NP Patient/Family location: The patient's home [facility] Provider location: [facility] [place] All persons participating in visit: Tine M. Zigmund Daniel and Jerrilyn Cairo, LCSW-A Types of Service: Telephone visit  I connected with [name]  via  Telephone or Video Enabled Telemedicine Application  (Video is Caregility application) and verified that I am speaking with the correct person using two identifiers. Discussed confidentiality: Yes   I discussed the limitations of telemedicine and the availability of in person appointments.  Discussed there is a possibility of technology failure and discussed alternative modes of communication if that failure occurs.   Patient and/or legal guardian expressed understanding and consented to Telemedicine visit: Yes   Presenting Concerns: Patient and/or family reports the following symptoms/concerns: The patient reports that she has been doing about the same since her last follow-up visit.  Ms. Branson shared her frustrations regarding her previous and current employers.  Ms. 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  The patient noted that she has been struggling with pain however, she found some relief with new shoe inserts that her primary care provider has suggested.  The patient stated that overall she is doing well and continues to process through feelings of grief and loss regarding her c cousin's passing.  Ms. Picone noted that she is currently working the  night shift and will need to adjust her future appointment times to late afternoon. The patient denied any suicidal or homicidal thoughts. Duration of problem: Years; Severity of problem: moderate  Patient and/or Family's Strengths/Protective Factors: Social connections, Concrete supports in place (healthy food, safe environments, etc.), Sense of purpose, and Physical Health (exercise, healthy diet, medication compliance, etc.)  Goals Addressed: Patient will:  Reduce symptoms of: agitation, anxiety, depression, insomnia, and stress   Increase knowledge and/or ability of: coping skills, healthy habits, self-management skills, and stress reduction   Demonstrate ability to: Increase healthy adjustment to current life circumstances  Progress towards Goals: Ongoing  Interventions: Interventions utilized:  CBT Cognitive Behavioral Therapywas utilized by the clinician during today's follow up session. Clinician met with patient to identify needs related to stressors and functioning, and assess and monitor for signs and symptoms of anxiety and depression, and assess safety. The clinician processed with the patient how they have been doing since the last follow-up session. Clean by her clinician measured the patient's anxiety and depression on a numerical scale.  Clinician offered to schedule the patient around 3 PM in the future to accommodate her new work schedule. Clinician continued to teach the patient calming relaxation and mindfulness skills and how to discriminate better between relaxation and tension and taught the client how to apply the skills in their daily life. Clinician assigned the patient homework in which they will use mindfulness skills daily gradually applying them progressively from not anxiety provoking to anxiety provoking situations and worked with the patient to resolve obstacles toward sustained implementation.   Standardized Assessments completed: Due to time constraints will  complete at next follow-up session.  Assessment: Patient currently experiencing see above.   Patient may benefit from see above.  Plan: Follow up with behavioral health clinician on : July 31, 2022 at 3:00 PM  Behavioral recommendations:  Referral(s): [place] (In Clinic)  I discussed the assessment and treatment plan with the patient and/or parent/guardian. They were provided an opportunity to ask questions and all were answered. They agreed with the plan and demonstrated an understanding of the instructions.   They were advised to call back or seek an in-person evaluation if the symptoms worsen or if the condition fails to improve as anticipated.  Lesli Albee, LCSWA

## 2022-07-19 NOTE — Patient Instructions (Signed)
Chronic Obstructive Pulmonary Disease  Chronic obstructive pulmonary disease (COPD) is a long-term (chronic) lung problem. When you have COPD, it is hard for air to get in and out of your lungs. Usually the condition gets worse over time, and your lungs will never return to normal. There are things you can do to keep yourself as healthy as possible. What are the causes? Smoking. This is the most common cause. Certain genes passed from parent to child (inherited). What increases the risk? Being exposed to secondhand smoke from cigarettes, pipes, or cigars. Being exposed to chemicals and other irritants, such as fumes and dust in the work environment. Having chronic lung conditions or infections. What are the signs or symptoms? Shortness of breath, especially during physical activity. A long-term cough with a large amount of thick mucus. Sometimes, the cough may not have any mucus (dry cough). Wheezing. Breathing quickly. Skin that looks gray or blue, especially in the fingers, toes, or lips. Feeling tired (fatigue). Weight loss. Chest tightness. Having infections often. Episodes when breathing symptoms become much worse (exacerbations). At the later stages of this disease, you may have swelling in the ankles, feet, or legs. How is this treated? Taking medicines. Quitting smoking, if you smoke. Rehabilitation. This includes steps to make your body work better. It may involve a team of specialists. Doing exercises. Making changes to your diet. Using oxygen. Lung surgery. Lung transplant. Comfort measures (palliative care). Follow these instructions at home: Medicines Take over-the-counter and prescription medicines only as told by your doctor. Talk to your doctor before taking any cough or allergy medicines. You may need to avoid medicines that cause your lungs to be dry. Lifestyle If you smoke, stop smoking. Smoking makes the problem worse. Do not smoke or use any products that  contain nicotine or tobacco. If you need help quitting, ask your doctor. Avoid being around things that make your breathing worse. This may include smoke, chemicals, and fumes. Stay active, but remember to rest as well. Learn and use tips on how to manage stress and control your breathing. Make sure you get enough sleep. Most adults need at least 7 hours of sleep every night. Eat healthy foods. Eat smaller meals more often. Rest before meals. Controlled breathing Learn and use tips on how to control your breathing as told by your doctor. Try: Breathing in (inhaling) through your nose for 1 second. Then, pucker your lips and breath out (exhale) through your lips for 2 seconds. Putting one hand on your belly (abdomen). Breathe in slowly through your nose for 1 second. Your hand on your belly should move out. Pucker your lips and breathe out slowly through your lips. Your hand on your belly should move in as you breathe out.  Controlled coughing Learn and use controlled coughing to clear mucus from your lungs. Follow these steps: Lean your head a little forward. Breathe in deeply. Try to hold your breath for 3 seconds. Keep your mouth slightly open while coughing 2 times. Spit any mucus out into a tissue. Rest and do the steps again 1 or 2 times as needed. General instructions Make sure you get all the shots (vaccines) that your doctor recommends. Ask your doctor about a flu shot and a pneumonia shot. Use oxygen therapy and pulmonary rehabilitation if told by your doctor. If you need home oxygen therapy, ask your doctor if you should buy a tool to measure your oxygen level (oximeter). Make a COPD action plan with your doctor. This helps you   to know what to do if you feel worse than usual. Manage any other conditions you have as told by your doctor. Avoid going outside when it is very hot, cold, or humid. Avoid people who have a sickness you can catch (contagious). Keep all follow-up  visits. Contact a doctor if: You cough up more mucus than usual. There is a change in the color or thickness of the mucus. It is harder to breathe than usual. Your breathing is faster than usual. You have trouble sleeping. You need to use your medicines more often than usual. You have trouble doing your normal activities such as getting dressed or walking around the house. Get help right away if: You have shortness of breath while resting. You have shortness of breath that stops you from: Being able to talk. Doing normal activities. Your chest hurts for longer than 5 minutes. Your skin color is more blue than usual. Your pulse oximeter shows that you have low oxygen for longer than 5 minutes. You have a fever. You feel too tired to breathe normally. These symptoms may represent a serious problem that is an emergency. Do not wait to see if the symptoms will go away. Get medical help right away. Call your local emergency services (911 in the [place] Do not drive yourself to the hospital. Summary Chronic obstructive pulmonary disease (COPD) is a long-term lung problem. The way your lungs work will never return to normal. Usually the condition gets worse over time. There are things you can do to keep yourself as healthy as possible. Take over-the-counter and prescription medicines only as told by your doctor. If you smoke, stop. Smoking makes the problem worse. This information is not intended to replace advice given to you by your health care provider. Make sure you discuss any questions you have with your health care provider. Document Revised: 08/02/2020 Document Reviewed: 08/02/2020 Elsevier Patient Education  [place]. Chronic Pain, Adult Chronic pain is a type of pain that lasts or keeps coming back for at least 3-6 months. You may have headaches, pain in the abdomen, or pain in other areas of the body. Chronic pain may be related to an illness, such as fibromyalgia or complex  regional pain syndrome. Chronic pain may also be related to an injury or a health condition. Sometimes, the cause of chronic pain is not known. Chronic pain can make it hard for you to do daily activities. If not treated, chronic pain can lead to anxiety and depression. Treatment depends on the cause and severity of your pain. You may need to work with a pain specialist to come up with a treatment plan. The plan may include medicine, counseling, and physical therapy. Many people benefit from a combination of two or more types of treatment to control their pain. Follow these instructions at home: Medicines Take over-the-counter and prescription medicines only as told by your health care provider. Ask your health care provider if the medicine prescribed to you: Requires you to avoid driving or using machinery. Can cause constipation. You may need to take these actions to prevent or treat constipation: Drink enough fluid to keep your urine pale yellow. Take over-the-counter or prescription medicines. Eat foods that are high in fiber, such as beans, whole grains, and fresh fruits and vegetables. Limit foods that are high in fat and processed sugars, such as fried or sweet foods. Treatment plan Follow your treatment plan as told by your health care provider. This may include: Gentle, regular exercise. Eating  a healthy diet that includes foods such as vegetables, fruits, fish, and lean meats. Cognitive or behavioral therapy that changes the way you think or act in response to the pain. This may help improve how you feel. Working with a physical therapist. Meditation, yoga, acupuncture, or massage therapy. Aroma, color, light, or sound therapy. Local electrical stimulation. The electrical pulses help to relieve pain by temporarily stopping the nerve impulses that cause you to feel pain. Injections. These deliver numbing or pain-relieving medicines into the spine or the area of pain.  Lifestyle  Ask  your health care provider whether you should keep a pain diary. Your health care provider will tell you what information to write in the diary. This may include when you have pain, what the pain feels like, and how medicines and other behaviors or treatments help to reduce the pain. Consider talking with a mental health care provider about how to manage chronic pain. Consider joining a chronic pain support group. Try to control or lower your stress levels. Talk with your health care provider about ways to do this. General instructions Learn as much as you can about how to manage your chronic pain. Ask your health care provider if an intensive pain rehabilitation program or a chronic pain specialist would be helpful. Check your pain level as told by your health care provider. Ask your health care provider if you should use a pain scale. It is up to you to get the results of any tests that were done. Ask your health care provider, or the department that is doing the tests, when your results will be ready. Keep all follow-up visits as told by your health care provider. This is important. Contact a health care provider if: Your pain gets worse, or you have new pain. You have trouble sleeping. You have trouble doing your normal activities. Your pain is not controlled with treatment. You have side effects from pain medicine. You feel weak. You notice any other changes that show that your condition is getting worse. Get help right away if: You lose feeling or have numbness in your body. You lose control of bowel or bladder function. Your pain suddenly gets much worse. You develop shaking or chills. You develop confusion. You develop chest pain. You have trouble breathing or shortness of breath. You pass out. You have thoughts about hurting yourself or others. If you ever feel like you may hurt yourself or others, or have thoughts about taking your own life, get help right away. Go to your nearest  emergency department or: Call your local emergency services (911 in the [place] Call a suicide crisis helpline, such as the National Suicide Prevention Lifeline at (616) 338-4951 or 988 in the [place] This is open 24 hours a day in the [place] Text the Crisis Text Line at 364 767 1396 (in the [place] Summary Chronic pain is a type of pain that lasts or keeps coming back for at least 3-6 months. Chronic pain may be related to an illness, injury, or other health condition. Sometimes, the cause of chronic pain is not known. Treatment depends on the cause and severity of your pain. Many people benefit from a combination of two or more types of treatment to control their pain. Follow your treatment plan as told by your health care provider. This information is not intended to replace advice given to you by your health care provider. Make sure you discuss any questions you have with your health care provider. Document Revised: 02/08/2022 Document Reviewed: 06/11/2019  Elsevier Patient Education  2023 [employer] Inc[employer]  

## 2022-07-19 NOTE — Progress Notes (Signed)
Established Patient Office Visit  Subjective   Patient ID: Amy Knapp, female    DOB: Apr 13, 1956  Age: 66 y.o. MRN: [id number]  Chief Complaint  Patient presents with   Follow-up    HPI  Adeola Dennen  is a 66 y/o female with a history of Allergy, Anxiety, Arthritis, Asthma, COPD, Depression, Diabetes, and headaches. She was last seen on 05/10/2022 for headaches that have been going on for 6 months now. She was prescribed Propranolol to address this. She says it provides "some relief" but "not a lot." However, she is still getting headaches 3-4 times a day. She still feels her headaches are stress related. She is stressed regarding her current job and says she is behind on her bills. She does see Jerrilyn Cairo for counseling. Today, her headache pain is a 9/10 and is "sharp" and "dull." Her head hurts "all over" and sometimes radiates to her neck. She says that Tylenol sometimes helps. Bright lights do make her headaches worse. She "sometimes" gets nauseous and vomits due to the pain. Additionally, she endorses bilateral leg and hip pain that is worsened with standing at work. Her legs "throb" and leg exercises help to alleviate the pain. She wants to explore options today to address her headaches and body pain. Her breathing is also not under control, as she is taking 4 puffs of her albuterol daily.    Review of Systems  Constitutional:  Positive for malaise/fatigue.  HENT: Negative.    Eyes: Negative.   Respiratory:  Positive for cough (.occasional), shortness of breath (.occasional) and wheezing (.ocassional).   Cardiovascular:  Positive for chest pain (.related to stress) and claudication.  Gastrointestinal: Negative.   Genitourinary:  Positive for frequency.  Musculoskeletal:  Positive for back pain, myalgias (.bilateral legs, hips) and neck pain.  Skin: Negative.   Neurological:  Positive for dizziness (.intermittent) and headaches.  Endo/Heme/Allergies:  Negative.   Psychiatric/Behavioral:  The patient is nervous/anxious.       Objective:     BP 134/82   Pulse 81   Temp 98 F (36.7 C) (Oral)   Wt 150 lb 11.2 oz (68.4 kg)   SpO2 91%   BMI 27.56 kg/m  BP Readings from Last 3 Encounters:  07/19/22 134/82  06/01/22 (!) 154/83  05/10/22 (!) 147/85   Wt Readings from Last 3 Encounters:  07/19/22 150 lb 11.2 oz (68.4 kg)  06/01/22 151 lb (68.5 kg)  05/10/22 152 lb 8 oz (69.2 kg)      Physical Exam Constitutional:      Appearance: Normal appearance.  HENT:     Head: Normocephalic and atraumatic.  Cardiovascular:     Rate and Rhythm: Normal rate and regular rhythm.     Pulses: Normal pulses.     Heart sounds: Normal heart sounds.  Pulmonary:     Effort: Pulmonary effort is normal.     Breath sounds: Normal breath sounds.  Musculoskeletal:        General: Normal range of motion.  Skin:    General: Skin is warm and dry.  Neurological:     Mental Status: She is alert and oriented to person, place, and time. Mental status is at baseline.  Psychiatric:     Comments: Flat affect      Results for orders placed or performed in visit on 07/19/22  POCT Glucose (CBG)  Result Value Ref Range   POC Glucose 94 70 - 99 mg/dl    Last CBC Lab Results  Component Value Date   WBC 6.8 01/23/2022   HGB 12.9 01/23/2022   HCT 41.2 01/23/2022   MCV 93.0 01/23/2022   MCH 29.1 01/23/2022   RDW 12.7 01/23/2022   PLT 244 35/32/9924   Last metabolic panel Lab Results  Component Value Date   GLUCOSE 205 (H) 04/05/2022   NA 138 04/05/2022   K 4.1 04/05/2022   CL 102 04/05/2022   CO2 24 04/05/2022   BUN 7 (L) 04/05/2022   CREATININE 0.76 04/05/2022   EGFR 88 04/05/2022   CALCIUM 9.5 04/05/2022   PROT 6.7 04/05/2022   ALBUMIN 4.0 04/05/2022   LABGLOB 2.7 04/05/2022   AGRATIO 1.5 04/05/2022   BILITOT <0.2 04/05/2022   ALKPHOS 95 04/05/2022   AST 33 04/05/2022   ALT 25 04/05/2022   ANIONGAP 9 01/23/2022   Last  lipids Lab Results  Component Value Date   CHOL 210 (H) 03/15/2022   HDL 63 03/15/2022   LDLCALC 126 (H) 03/15/2022   TRIG 118 03/15/2022   CHOLHDL 3.3 03/15/2022   Last hemoglobin A1c Lab Results  Component Value Date   HGBA1C 6.4 (A) 02/01/2022   Last thyroid functions Lab Results  Component Value Date   TSH 1.890 03/15/2022   T4TOTAL 7.5 07/28/2020      The 10-year ASCVD risk score (Arnett DK, et al., 2019) is: 11.9%    Assessment & Plan:   1. Type 2 diabetes mellitus with diabetic neuropathy, without long-term current use of insulin (HCC) - Her diabetes is well-controlled. Her HgbA1c is 6.5% and POCT BG 96 mg/dL. Continue to eat a low-carb diet and exercise as tolerated.  - POCT Glucose (CBG) - POCT HgB A1C - gabapentin (NEURONTIN) 400 MG capsule; Take 1 capsule (400 mg total) by mouth 3 (three) times daily.  Dispense: 270 capsule; Refill: 2  2. Hyperlipidemia associated with type 2 diabetes mellitus ([place]) - Continue atorvastatin as prescribed. Low-cholesterol diet and exercise as tolerated.   3. Hypertension associated with diabetes ([place]) - Her blood pressure is well-controlled. Today in clinic it is 134/82 mmHg. Continue taking losartan for blood pressure management.   4. Insomnia, unspecified type - Patient's sleep schedule has changed recently due to her working third shift. Take trazadone before going to bed.  - traZODone (DESYREL) 50 MG tablet; Take 3 tablets (150 mg total) by mouth once daily at bedtime.  Dispense: 90 tablet; Refill: 1  5. Chronic obstructive asthma - Her breathing is not controlled. She takes 4 puffs of albuterol daily. Will prescribe Trelegy today for maintenance therapy. Continue to take albuterol as needed.   - montelukast (SINGULAIR) 10 MG tablet; TAKE ONE TABLET BY MOUTH ONCE NIGHTLY AT BEDTIME.  Dispense: 90 tablet; Refill: 1 - albuterol (PROVENTIL HFA) 108 (90 Base) MCG/ACT inhaler; Inhale 2 puffs into the lungs once every 6 (six)  hours as needed for wheezing or shortness of breath.  Dispense: 6.7 g; Refill: 1   7. Hypothyroidism, unspecified type - Continue to take synthroid. TSH 1.890 on 03/15/2022. - levothyroxine (SYNTHROID) 100 MCG tablet; TAKE ONE TABLET BY MOUTH ONCE DAILY 45 MINUTES BEFORE BREAKFAST.  Dispense: 90 tablet; Refill: 1   9. Gout of multiple sites, unspecified cause, unspecified chronicity - Continue taking allopurinol for gout. - allopurinol (ZYLOPRIM) 100 MG tablet; TAKE ONE TABLET BY MOUTH EVERY DAY  Dispense: 90 tablet; Refill: 1  10. Chronic pain of bilateral legs and hip.  - Rx for Cymbalta today. Continue to perform leg exercises, wear supportive shoes at  work, and get daily exercise. Pt was advised to call clinic if she experiences mood changes or suicidal ideation.   F/u in 1 month, around 08/16/22  Rayvon Char   Physical exam, ROS and plan of care established with me as the overseeing preceptor. Patient updated and educated on treatment plan.

## 2022-07-23 ENCOUNTER — Encounter: Payer: Self-pay | Admitting: Adult Health

## 2022-07-31 ENCOUNTER — Telehealth: Payer: Self-pay | Admitting: Licensed Clinical Social Worker

## 2022-07-31 ENCOUNTER — Ambulatory Visit: Payer: Self-pay | Admitting: Licensed Clinical Social Worker

## 2022-07-31 NOTE — Telephone Encounter (Signed)
Called the patient twice during today's scheduled appointment; no answer, left a voicemail with the clinic contact information so they may reschedule.   

## 2022-08-21 ENCOUNTER — Other Ambulatory Visit: Payer: Self-pay

## 2022-08-21 ENCOUNTER — Telehealth: Payer: Self-pay | Admitting: Gerontology

## 2022-08-21 NOTE — Telephone Encounter (Signed)
Called pt to make appt for Amy Knapp. No answer Left msg. 1st no show letter sent.

## 2022-08-23 ENCOUNTER — Ambulatory Visit: Payer: Self-pay | Admitting: Gerontology

## 2022-08-23 VITALS — BP 132/72 | HR 81 | Resp 16 | Wt 145.7 lb

## 2022-08-23 DIAGNOSIS — F439 Reaction to severe stress, unspecified: Secondary | ICD-10-CM

## 2022-08-23 DIAGNOSIS — M545 Low back pain, unspecified: Secondary | ICD-10-CM

## 2022-08-23 DIAGNOSIS — R519 Headache, unspecified: Secondary | ICD-10-CM | POA: Insufficient documentation

## 2022-08-23 NOTE — Progress Notes (Signed)
Established Patient Office Visit  Subjective   Patient ID: Amy Knapp, female    DOB: [birth year]  Age: 66 y.o. MRN: [id number]  Chief Complaint: Follow-Up  HPI  Amy Knapp  is a 66 y/o female with a history of Allergy, Anxiety, Arthritis, Asthma, COPD, Depression, Diabetes, and headaches. She was last seen in the clinic on 07/19/2022 and was prescribed Cymbalta for her myalgias and mood. She states her pain has improved with the Cymbalta along with her not working anymore where she was constantly on her feet. However, she did state that she takes three 30 mg Cymbalta about 1-2 times a week. She was educated on the importance on taking the medication as prescribed (30 mg daily) to avoid serotonin syndrome and other adverse effects. She is still getting daily headaches that she feels are stress related. She is currently un-employed and is in the process of being evicted from her apartment. Stress is her main concern at this time. She does see the [facility]. She offers no further complaints.   Review of Systems  Constitutional: Negative.   Eyes: Negative.   Respiratory: Negative.    Cardiovascular: Negative.   Gastrointestinal: Negative.   Genitourinary: Negative.   Musculoskeletal: Negative.   Neurological: Negative.   Endo/Heme/Allergies: Negative.   Psychiatric/Behavioral:         Stress      Objective:     Today's Vitals   08/23/22 0911  BP: 132/72  Pulse: 81  Resp: 16  Weight: 145 lb 11.2 oz (66.1 kg)   Body mass index is 26.65 kg/m.  BP Readings from Last 3 Encounters:  08/23/22 132/72  07/19/22 134/82  06/01/22 (!) 154/83   Wt Readings from Last 3 Encounters:  08/23/22 145 lb 11.2 oz (66.1 kg)  07/19/22 150 lb 11.2 oz (68.4 kg)  06/01/22 151 lb (68.5 kg)      Physical Exam Constitutional:      Appearance: Normal appearance. She is normal weight.  HENT:     Head: Normocephalic and atraumatic.   Cardiovascular:     Rate and Rhythm: Normal rate and regular rhythm.     Pulses: Normal pulses.     Heart sounds: Normal heart sounds.  Pulmonary:     Effort: Pulmonary effort is normal.     Breath sounds: Normal breath sounds.  Skin:    General: Skin is warm and dry.  Neurological:     General: No focal deficit present.     Mental Status: She is alert and oriented to person, place, and time. Mental status is at baseline.  Psychiatric:        Mood and Affect: Mood normal.        Behavior: Behavior normal.        Thought Content: Thought content normal.        Judgment: Judgment normal.      No results found for any visits on 08/23/22.  Last CBC Lab Results  Component Value Date   WBC 6.8 01/23/2022   HGB 12.9 01/23/2022   HCT 41.2 01/23/2022   MCV 93.0 01/23/2022   MCH 29.1 01/23/2022   RDW 12.7 01/23/2022   PLT 244 95/18/8416   Last metabolic panel Lab Results  Component Value Date   GLUCOSE 205 (H) 04/05/2022   NA 138 04/05/2022   K 4.1 04/05/2022   CL 102 04/05/2022   CO2 24 04/05/2022   BUN 7 (L) 04/05/2022   CREATININE 0.76 04/05/2022  EGFR 88 04/05/2022   CALCIUM 9.5 04/05/2022   PROT 6.7 04/05/2022   ALBUMIN 4.0 04/05/2022   LABGLOB 2.7 04/05/2022   AGRATIO 1.5 04/05/2022   BILITOT <0.2 04/05/2022   ALKPHOS 95 04/05/2022   AST 33 04/05/2022   ALT 25 04/05/2022   ANIONGAP 9 01/23/2022   Last lipids Lab Results  Component Value Date   CHOL 210 (H) 03/15/2022   HDL 63 03/15/2022   LDLCALC 126 (H) 03/15/2022   TRIG 118 03/15/2022   CHOLHDL 3.3 03/15/2022   Last hemoglobin A1c Lab Results  Component Value Date   HGBA1C 6.5 (A) 07/19/2022   Last thyroid functions Lab Results  Component Value Date   TSH 1.890 03/15/2022   T4TOTAL 7.5 07/28/2020      The 10-year ASCVD risk score (Arnett DK, et al., 2019) is: 11.9%    Assessment & Plan:    1. Low back pain, unspecified back pain laterality, unspecified chronicity, unspecified  whether sciatica present - Her back pain is improving. She should continue to take Cymbalta 30 mg daily along with her prescribed gabapentin. Continue to exercise and apply warm/and or cold compresses for relief.   2. Nonintractable headache, unspecified chronicity pattern, unspecified headache type - Her headaches are most likely related to ongoing stress. She should continue to work on [employer], therapeutic with the [facility]ist. Stay hydrated and get adequate rest.   Return in about 3 months (around 11/21/2022), or if symptoms worsen or fail to improve.   Rayvon Char, FNP Student

## 2022-08-23 NOTE — Patient Instructions (Signed)

## 2022-09-11 ENCOUNTER — Other Ambulatory Visit: Payer: Self-pay

## 2022-09-11 MED ORDER — DULOXETINE HCL 30 MG PO CPEP
30.0000 mg | ORAL_CAPSULE | Freq: Every day | ORAL | 1 refills | Status: DC
  Filled 2022-09-11 – 2022-11-29 (×2): qty 90, 90d supply, fill #0

## 2022-09-11 NOTE — Progress Notes (Signed)
Refill sent in for pt. 

## 2022-09-19 ENCOUNTER — Other Ambulatory Visit: Payer: Self-pay

## 2022-09-19 ENCOUNTER — Ambulatory Visit: Payer: Self-pay | Admitting: Gerontology

## 2022-09-19 VITALS — BP 129/74 | HR 100 | Temp 97.9°F | Resp 16 | Wt 147.3 lb

## 2022-09-19 DIAGNOSIS — J0110 Acute frontal sinusitis, unspecified: Secondary | ICD-10-CM

## 2022-09-19 DIAGNOSIS — J329 Chronic sinusitis, unspecified: Secondary | ICD-10-CM | POA: Insufficient documentation

## 2022-09-19 DIAGNOSIS — R051 Acute cough: Secondary | ICD-10-CM

## 2022-09-19 DIAGNOSIS — J209 Acute bronchitis, unspecified: Secondary | ICD-10-CM | POA: Insufficient documentation

## 2022-09-19 MED ORDER — BENZONATATE 100 MG PO CAPS
100.0000 mg | ORAL_CAPSULE | Freq: Three times a day (TID) | ORAL | 0 refills | Status: DC | PRN
  Filled 2022-09-19: qty 20, 7d supply, fill #0

## 2022-09-19 MED ORDER — SALINE SPRAY 0.65 % NA SOLN
1.0000 | NASAL | 0 refills | Status: DC | PRN
  Filled 2022-09-19: qty 30, fill #0

## 2022-09-19 MED ORDER — PREDNISONE 10 MG PO TABS
10.0000 mg | ORAL_TABLET | Freq: Every day | ORAL | 0 refills | Status: DC
  Filled 2022-09-19: qty 5, 5d supply, fill #0

## 2022-09-19 MED ORDER — DOXYCYCLINE HYCLATE 50 MG PO CAPS
50.0000 mg | ORAL_CAPSULE | Freq: Two times a day (BID) | ORAL | 0 refills | Status: DC
  Filled 2022-09-19: qty 10, 5d supply, fill #0

## 2022-09-19 NOTE — Patient Instructions (Signed)

## 2022-09-19 NOTE — Progress Notes (Signed)
Established Patient Office Visit  Subjective   Patient ID: Amy Knapp, female    DOB: 10-Jun-1956  Age: 66 y.o. MRN: [id number]  No chief complaint on file.   HPI Delaina Fetsch  is a 66 y/o female with a history of Allergy, Anxiety, Arthritis, Asthma, COPD, Depression, Diabetes, and headaches.She presents today with complaints of bilateral ear aches, coughing up greenish phlegm, watery eyes, and sore throat. She denies chest pain but endorsed chest tightness and shortness of breath with exertion. She states that symptoms started two days ago and has progressively gotten worse. She denies sick contacts. She states that she has taken some Aleeve, Sudafed, and Robitussin without much relieve. She is a current smoker with an 18 year history. She verbalized smoking 1/2 pack a day but states that she hasn't smoked in 2 weeks. She states that she is working on quitting. In addition, she verbalized not having any appetite and denies nausea or vomiting.     Review of Systems  Constitutional:  Positive for chills and malaise/fatigue.  HENT:  Positive for ear pain, sinus pain and sore throat.   Eyes: Negative.   Respiratory:  Positive for cough (.Reported coughing up greenish Phlegm), sputum production and shortness of breath.   Cardiovascular: Negative.   Gastrointestinal: Negative.   Genitourinary: Negative.   Musculoskeletal:        Generalized body aches  Skin: Negative.   Neurological: Negative.   Endo/Heme/Allergies: Negative.   Psychiatric/Behavioral: Negative.        Objective:     BP 129/74 (BP Location: Right Arm, Patient Position: Sitting, Cuff Size: Large)   Pulse 100   Temp 97.9 F (36.6 C)   Resp 16   Wt 147 lb 4.8 oz (66.8 kg)   SpO2 (!) 88%   BMI 26.94 kg/m  BP Readings from Last 3 Encounters:  09/19/22 129/74  08/23/22 132/72  07/19/22 134/82   Wt Readings from Last 3 Encounters:  09/19/22 147 lb 4.8 oz (66.8 kg)  08/23/22 145 lb 11.2 oz  (66.1 kg)  07/19/22 150 lb 11.2 oz (68.4 kg)      Physical Exam Constitutional:      Appearance: Normal appearance.  HENT:     Head: Normocephalic.     Right Ear: Tympanic membrane normal.     Left Ear: Tympanic membrane normal.     Ears:     Comments: Tenderness to palpation    Nose: Nose normal.     Mouth/Throat:     Mouth: Mucous membranes are moist.     Comments: Tenderness to palpation Eyes:     Pupils: Pupils are equal, round, and reactive to light.  Neck:     Comments: Tenderness to palpation Cardiovascular:     Rate and Rhythm: Normal rate and regular rhythm.     Pulses: Normal pulses.     Heart sounds: Normal heart sounds.  Pulmonary:     Effort: Pulmonary effort is normal.     Breath sounds: Examination of the right-upper field reveals decreased breath sounds. Examination of the left-upper field reveals decreased breath sounds. Examination of the right-middle field reveals decreased breath sounds. Examination of the left-middle field reveals decreased breath sounds. Examination of the right-lower field reveals decreased breath sounds. Examination of the left-lower field reveals decreased breath sounds. Decreased breath sounds present. No rales.  Abdominal:     General: Bowel sounds are normal.  Musculoskeletal:        General: Normal range of motion.  Skin:    General: Skin is warm.  Neurological:     General: No focal deficit present.     Mental Status: She is alert.  Psychiatric:        Mood and Affect: Mood normal.      No results found for any visits on 09/19/22.  Last CBC Lab Results  Component Value Date   WBC 6.8 01/23/2022   HGB 12.9 01/23/2022   HCT 41.2 01/23/2022   MCV 93.0 01/23/2022   MCH 29.1 01/23/2022   RDW 12.7 01/23/2022   PLT 244 63/10/6008   Last metabolic panel Lab Results  Component Value Date   GLUCOSE 205 (H) 04/05/2022   NA 138 04/05/2022   K 4.1 04/05/2022   CL 102 04/05/2022   CO2 24 04/05/2022   BUN 7 (L)  04/05/2022   CREATININE 0.76 04/05/2022   EGFR 88 04/05/2022   CALCIUM 9.5 04/05/2022   PROT 6.7 04/05/2022   ALBUMIN 4.0 04/05/2022   LABGLOB 2.7 04/05/2022   AGRATIO 1.5 04/05/2022   BILITOT <0.2 04/05/2022   ALKPHOS 95 04/05/2022   AST 33 04/05/2022   ALT 25 04/05/2022   ANIONGAP 9 01/23/2022   Last lipids Lab Results  Component Value Date   CHOL 210 (H) 03/15/2022   HDL 63 03/15/2022   LDLCALC 126 (H) 03/15/2022   TRIG 118 03/15/2022   CHOLHDL 3.3 03/15/2022   Last hemoglobin A1c Lab Results  Component Value Date   HGBA1C 6.5 (A) 07/19/2022   Last thyroid functions Lab Results  Component Value Date   TSH 1.890 03/15/2022   T4TOTAL 7.5 07/28/2020   Last vitamin D No results found for: "25OHVITD2", "25OHVITD3", "VD25OH" Last vitamin B12 and Folate No results found for: "VITAMINB12", "FOLATE"    The 10-year ASCVD risk score (Arnett DK, et al., 2019) is: 11.1%    Assessment & Plan:   1. Acute bronchitis with COPD ([place]) -Ddx  bronchitis, COPD flare -She reports coughing up greenish Phlegm. Was not able to complete X-ray in office thus, was started on Doxycycline prophylactic. She was educated on medication side effects and advised to contact clinic.   - doxycycline (VIBRAMYCIN) 50 MG capsule; Take 1 capsule (50 mg total) by mouth 2 (two) times daily.  Dispense: 10 capsule; Refill: 0 - predniSONE (DELTASONE) 10 MG tablet; Take 1 tablet (10 mg total) by mouth daily with breakfast.  Dispense: 5 tablet; Refill: 0  -she was educated to perform oral hygiene after taking meds to prevent oral thrush.   2. Acute cough -Reports coughing up green Phlegm  - benzonatate (TESSALON PERLES) 100 MG capsule; Take 1 capsule (100 mg total) by mouth 3 (three) times daily as needed for cough.  Dispense: 20 capsule; Refill: 0  3. Acute non-recurrent frontal sinusitis -Endorsed tenderness with palpation of frontal sinuses  - sodium chloride (OCEAN) 0.65 % SOLN nasal spray; Place 1  spray into both nostrils as needed for congestion.  Dispense: 30 mL; Refill: 0     Return in about 22 days (around 10/11/2022), or if symptoms worsen or fail to improve.    Carney Corners, FNP

## 2022-10-11 ENCOUNTER — Ambulatory Visit: Payer: Self-pay | Admitting: Gerontology

## 2022-10-11 ENCOUNTER — Other Ambulatory Visit: Payer: Self-pay

## 2022-10-11 ENCOUNTER — Encounter: Payer: Self-pay | Admitting: Gerontology

## 2022-10-11 ENCOUNTER — Ambulatory Visit: Payer: Self-pay | Admitting: Licensed Clinical Social Worker

## 2022-10-11 VITALS — BP 152/77 | HR 60 | Temp 97.9°F | Resp 16 | Ht 61.5 in | Wt 150.0 lb

## 2022-10-11 DIAGNOSIS — E1140 Type 2 diabetes mellitus with diabetic neuropathy, unspecified: Secondary | ICD-10-CM

## 2022-10-11 DIAGNOSIS — I10 Essential (primary) hypertension: Secondary | ICD-10-CM

## 2022-10-11 DIAGNOSIS — G4700 Insomnia, unspecified: Secondary | ICD-10-CM

## 2022-10-11 DIAGNOSIS — F411 Generalized anxiety disorder: Secondary | ICD-10-CM

## 2022-10-11 LAB — POCT GLYCOSYLATED HEMOGLOBIN (HGB A1C): Hemoglobin A1C: 6.5 % — AB (ref 4.0–5.6)

## 2022-10-11 LAB — GLUCOSE, POCT (MANUAL RESULT ENTRY): POC Glucose: 99 mg/dl (ref 70–99)

## 2022-10-11 MED ORDER — BLOOD PRESSURE KIT
1.0000 | PACK | Freq: Every day | 0 refills | Status: AC
  Filled 2022-10-11: qty 1, fill #0

## 2022-10-11 MED ORDER — TRAZODONE HCL 50 MG PO TABS
150.0000 mg | ORAL_TABLET | Freq: Every day | ORAL | 1 refills | Status: DC
  Filled 2022-10-11: qty 90, 30d supply, fill #0
  Filled 2022-11-13: qty 90, 30d supply, fill #1

## 2022-10-11 NOTE — Patient Instructions (Signed)
Carbohydrate Counting for Diabetes Mellitus, Adult Carbohydrate counting is a method of keeping track of how many carbohydrates you eat. Eating carbohydrates increases the amount of sugar (glucose) in the blood. Counting how many carbohydrates you eat improves how well you manage your blood glucose. This, in turn, helps you manage your diabetes. Carbohydrates are measured in grams (g) per serving. It is important to know how many carbohydrates (in grams or by serving size) you can have in each meal. This is different for every person. A dietitian can help you make a meal plan and calculate how many carbohydrates you should have at each meal and snack. What foods contain carbohydrates? Carbohydrates are found in the following foods: Grains, such as breads and cereals. Dried beans and soy products. Starchy vegetables, such as potatoes, peas, and corn. Fruit and fruit juices. Milk and yogurt. Sweets and snack foods, such as cake, cookies, candy, chips, and soft drinks. How do I count carbohydrates in foods? There are two ways to count carbohydrates in food. You can read food labels or learn standard serving sizes of foods. You can use either of these methods or a combination of both. Using the Nutrition Facts label The Nutrition Facts list is included on the labels of almost all packaged foods and beverages in the [place]. It includes: The serving size. Information about nutrients in each serving, including the grams of carbohydrate per serving. To use the Nutrition Facts, decide how many servings you will have. Then, multiply the number of servings by the number of carbohydrates per serving. The resulting number is the total grams of carbohydrates that you will be having. Learning the standard serving sizes of foods When you eat carbohydrate foods that are not packaged or do not include Nutrition Facts on the label, you need to measure the servings in order to count the grams of  carbohydrates. Measure the foods that you will eat with a food scale or measuring cup, if needed. Decide how many standard-size servings you will eat. Multiply the number of servings by 15. For foods that contain carbohydrates, one serving equals 15 g of carbohydrates. For example, if you eat 2 cups or 10 oz (300 g) of strawberries, you will have eaten 2 servings and 30 g of carbohydrates (2 servings x 15 g = 30 g). For foods that have more than one food mixed, such as soups and casseroles, you must count the carbohydrates in each food that is included. The following list contains standard serving sizes of common carbohydrate-rich foods. Each of these servings has about 15 g of carbohydrates: 1 slice of bread. 1 six-inch (15 cm) tortilla. ? cup or 2 oz (53 g) cooked rice or pasta.  cup or 3 oz (85 g) cooked or canned, drained and rinsed beans or lentils.  cup or 3 oz (85 g) starchy vegetable, such as peas, corn, or squash.  cup or 4 oz (120 g) hot cereal.  cup or 3 oz (85 g) boiled or mashed potatoes, or  or 3 oz (85 g) of a large baked potato.  cup or 4 fl oz (118 mL) fruit juice. 1 cup or 8 fl oz (237 mL) milk. 1 small or 4 oz (106 g) apple.  or 2 oz (63 g) of a medium banana. 1 cup or 5 oz (150 g) strawberries. 3 cups or 1 oz (28.3 g) popped popcorn. What is an example of carbohydrate counting? To calculate the grams of carbohydrates in this sample meal, follow the steps   shown below. Sample meal 3 oz (85 g) chicken breast. ? cup or 4 oz (106 g) brown rice.  cup or 3 oz (85 g) corn. 1 cup or 8 fl oz (237 mL) milk. 1 cup or 5 oz (150 g) strawberries with sugar-free whipped topping. Carbohydrate calculation Identify the foods that contain carbohydrates: Rice. Corn. Milk. Strawberries. Calculate how many servings you have of each food: 2 servings rice. 1 serving corn. 1 serving milk. 1 serving strawberries. Multiply each number of servings by 15 g: 2 servings rice x 15  g = 30 g. 1 serving corn x 15 g = 15 g. 1 serving milk x 15 g = 15 g. 1 serving strawberries x 15 g = 15 g. Add together all of the amounts to find the total grams of carbohydrates eaten: 30 g + 15 g + 15 g + 15 g = 75 g of carbohydrates total. What are tips for following this plan? Shopping Develop a meal plan and then make a shopping list. Buy fresh and frozen vegetables, fresh and frozen fruit, dairy, eggs, beans, lentils, and whole grains. Look at food labels. Choose foods that have more fiber and less sugar. Avoid processed foods and foods with added sugars. Meal planning Aim to have the same number of grams of carbohydrates at each meal and for each snack time. Plan to have regular, balanced meals and snacks. Where to find more information American Diabetes Association: diabetes.org Centers for Disease Control and Prevention: cdc.gov Academy of Nutrition and Dietetics: eatright.org Association of Diabetes Care & Education Specialists: diabeteseducator.org Summary Carbohydrate counting is a method of keeping track of how many carbohydrates you eat. Eating carbohydrates increases the amount of sugar (glucose) in your blood. Counting how many carbohydrates you eat improves how well you manage your blood glucose. This helps you manage your diabetes. A dietitian can help you make a meal plan and calculate how many carbohydrates you should have at each meal and snack. This information is not intended to replace advice given to you by your health care provider. Make sure you discuss any questions you have with your health care provider. Document Revised: 04/27/2020 Document Reviewed: 04/27/2020 Elsevier Patient Education  2023 Else[employer] Inc. DASH Eating Plan DASH stands for Dietary Approaches to Stop Hypertension. The DASH eating plan is a healthy eating plan that has been shown to: Reduce high blood pressure (hypertension). Reduce your risk for type 2 diabetes, heart disease, and  stroke. Help with weight loss. What are tips for following this plan? Reading food labels Check food labels for the amount of salt (sodium) per serving. Choose foods with less than 5 percent of the Daily Value of sodium. Generally, foods with less than 300 milligrams (mg) of sodium per serving fit into this eating plan. To find whole grains, look for the word "whole" as the first word in the ingredient list. Shopping Buy products labeled as "low-sodium" or "no salt added." Buy fresh foods. Avoid canned foods and pre-made or frozen meals. Cooking Avoid adding salt when cooking. Use salt-free seasonings or herbs instead of table salt or sea salt. Check with your health care provider or pharmacist before using salt substitutes. Do not fry foods. Cook foods using healthy methods such as baking, boiling, grilling, roasting, and broiling instead. Cook with heart-healthy oils, such as olive, canola, avocado, soybean, or sunflower oil. Meal planning  Eat a balanced diet that includes: 4 or more servings of fruits and 4 or more servings of vegetables each day.   Try to fill one-half of your plate with fruits and vegetables. 6-8 servings of whole grains each day. Less than 6 oz (170 g) of lean meat, poultry, or fish each day. A 3-oz (85-g) serving of meat is about the same size as a deck of cards. One egg equals 1 oz (28 g). 2-3 servings of low-fat dairy each day. One serving is 1 cup (237 mL). 1 serving of nuts, seeds, or beans 5 times each week. 2-3 servings of heart-healthy fats. Healthy fats called omega-3 fatty acids are found in foods such as walnuts, flaxseeds, fortified milks, and eggs. These fats are also found in cold-water fish, such as sardines, salmon, and mackerel. Limit how much you eat of: Canned or prepackaged foods. Food that is high in trans fat, such as some fried foods. Food that is high in saturated fat, such as fatty meat. Desserts and other sweets, sugary drinks, and other foods  with added sugar. Full-fat dairy products. Do not salt foods before eating. Do not eat more than 4 egg yolks a week. Try to eat at least 2 vegetarian meals a week. Eat more home-cooked food and less restaurant, buffet, and fast food. Lifestyle When eating at a restaurant, ask that your food be prepared with less salt or no salt, if possible. If you drink alcohol: Limit how much you use to: 0-1 drink a day for women who are not pregnant. 0-2 drinks a day for men. Be aware of how much alcohol is in your drink. In the [place] one drink equals one 12 oz bottle of beer (355 mL), one 5 oz glass of wine (148 mL), or one 1 oz glass of hard liquor (44 mL). General information Avoid eating more than 2,300 mg of salt a day. If you have hypertension, you may need to reduce your sodium intake to 1,500 mg a day. Work with your health care provider to maintain a healthy body weight or to lose weight. Ask what an ideal weight is for you. Get at least 30 minutes of exercise that causes your heart to beat faster (aerobic exercise) most days of the week. Activities may include walking, swimming, or biking. Work with your health care provider or dietitian to adjust your eating plan to your individual calorie needs. What foods should I eat? Fruits All fresh, dried, or frozen fruit. Canned fruit in natural juice (without added sugar). Vegetables Fresh or frozen vegetables (raw, steamed, roasted, or grilled). Low-sodium or reduced-sodium tomato and vegetable juice. Low-sodium or reduced-sodium tomato sauce and tomato paste. Low-sodium or reduced-sodium canned vegetables. Grains Whole-grain or whole-wheat bread. Whole-grain or whole-wheat pasta. Brown rice. Oatmeal. Quinoa. Bulgur. Whole-grain and low-sodium cereals. Pita bread. Low-fat, low-sodium crackers. Whole-wheat flour tortillas. Meats and other proteins Skinless chicken or turkey. Ground chicken or turkey. Pork with fat trimmed off. Fish and seafood. Egg  whites. Dried beans, peas, or lentils. Unsalted nuts, nut butters, and seeds. Unsalted canned beans. Lean cuts of beef with fat trimmed off. Low-sodium, lean precooked or cured meat, such as sausages or meat loaves. Dairy Low-fat (1%) or fat-free (skim) milk. Reduced-fat, low-fat, or fat-free cheeses. Nonfat, low-sodium ricotta or cottage cheese. Low-fat or nonfat yogurt. Low-fat, low-sodium cheese. Fats and oils Soft margarine without trans fats. Vegetable oil. Reduced-fat, low-fat, or light mayonnaise and salad dressings (reduced-sodium). Canola, safflower, olive, avocado, soybean, and sunflower oils. Avocado. Seasonings and condiments Herbs. Spices. Seasoning mixes without salt. Other foods Unsalted popcorn and pretzels. Fat-free sweets. The items listed above may not be   a complete list of foods and beverages you can eat. Contact a dietitian for more information. What foods should I avoid? Fruits Canned fruit in a light or heavy syrup. Fried fruit. Fruit in cream or butter sauce. Vegetables Creamed or fried vegetables. Vegetables in a cheese sauce. Regular canned vegetables (not low-sodium or reduced-sodium). Regular canned tomato sauce and paste (not low-sodium or reduced-sodium). Regular tomato and vegetable juice (not low-sodium or reduced-sodium). Pickles. Olives. Grains Baked goods made with fat, such as croissants, muffins, or some breads. Dry pasta or rice meal packs. Meats and other proteins Fatty cuts of meat. Ribs. Fried meat. Bacon. Bologna, salami, and other precooked or cured meats, such as sausages or meat loaves. Fat from the back of a pig (fatback). Bratwurst. Salted nuts and seeds. Canned beans with added salt. Canned or smoked fish. Whole eggs or egg yolks. Chicken or turkey with skin. Dairy Whole or 2% milk, cream, and half-and-half. Whole or full-fat cream cheese. Whole-fat or sweetened yogurt. Full-fat cheese. Nondairy creamers. Whipped toppings. Processed cheese and  cheese spreads. Fats and oils Butter. Stick margarine. Lard. Shortening. Ghee. Bacon fat. Tropical oils, such as coconut, palm kernel, or palm oil. Seasonings and condiments Onion salt, garlic salt, seasoned salt, table salt, and sea salt. Worcestershire sauce. Tartar sauce. Barbecue sauce. Teriyaki sauce. Soy sauce, including reduced-sodium. Steak sauce. Canned and packaged gravies. Fish sauce. Oyster sauce. Cocktail sauce. Store-bought horseradish. Ketchup. Mustard. Meat flavorings and tenderizers. Bouillon cubes. Hot sauces. Pre-made or packaged marinades. Pre-made or packaged taco seasonings. Relishes. Regular salad dressings. Other foods Salted popcorn and pretzels. The items listed above may not be a complete list of foods and beverages you should avoid. Contact a dietitian for more information. Where to find more information National Heart, Lung, and Blood Institute: www.nhlbi.nih.gov American Heart Association: www.heart.org Academy of Nutrition and Dietetics: www.eatright.org National Kidney Foundation: www.kidney.org Summary The DASH eating plan is a healthy eating plan that has been shown to reduce high blood pressure (hypertension). It may also reduce your risk for type 2 diabetes, heart disease, and stroke. When on the DASH eating plan, aim to eat more fresh fruits and vegetables, whole grains, lean proteins, low-fat dairy, and heart-healthy fats. With the DASH eating plan, you should limit salt (sodium) intake to 2,300 mg a day. If you have hypertension, you may need to reduce your sodium intake to 1,500 mg a day. Work with your health care provider or dietitian to adjust your eating plan to your individual calorie needs. This information is not intended to replace advice given to you by your health care provider. Make sure you discuss any questions you have with your health care provider. Document Revised: 08/28/2019 Document Reviewed: 08/28/2019 Elsevier Patient Education  2023  Elsevier Inc.  

## 2022-10-11 NOTE — Progress Notes (Signed)
Established Patient Office Visit  Subjective   Patient ID: Amy Knapp, female    DOB: 1955-11-29  Age: 67 y.o. MRN: [id number]  Chief Complaint  Patient presents with   Follow-up   Hypertension   Diabetes    HPI  Amy Knapp  is a 67 y/o female with a history of Allergy, Anxiety, Arthritis, Asthma, COPD, Depression, Diabetes, and headaches. She was treated with Doxycycline, prednisone during her visit on 09/19/22.Currently, she states that she's doing well and denies any symptoms. Her HgbA1c checked during visit was 6.5% and blood glucose was 99 mg/dl. She denies hypo/hyperglycemic symptoms, and peripheral neuropathy is under control with gabapentin, and performs daily foot checks. Her blood pressure was elevated during visit at 170/86 and was 152/77 when rechecked. She states that she didn't sleep well because she was out of her Trazodone for 5 days. She denies headache, palpitation, dizziness and vision changes. She states that her mood is good, denies homicidal nor suicidal ideation.Overall, she states that she's doing well and offers no further   Review of Systems  Constitutional: Negative.   Respiratory: Negative.    Cardiovascular: Negative.   Skin: Negative.   Neurological: Negative.   Psychiatric/Behavioral: Negative.        Objective:     BP (!) 152/77 (BP Location: Right Arm, Patient Position: Sitting, Cuff Size: Large)   Pulse 60   Temp 97.9 F (36.6 C) (Oral)   Resp 16   Ht 5' 1.5" (1.562 m)   Wt 150 lb (68 kg)   SpO2 94%   BMI 27.88 kg/m  BP Readings from Last 3 Encounters:  10/11/22 (!) 152/77  09/19/22 129/74  08/23/22 132/72   Wt Readings from Last 3 Encounters:  10/11/22 150 lb (68 kg)  09/19/22 147 lb 4.8 oz (66.8 kg)  08/23/22 145 lb 11.2 oz (66.1 kg)      Physical Exam HENT:     Head: Normocephalic and atraumatic.     Mouth/Throat:     Mouth: Mucous membranes are moist.  Eyes:     Pupils: Pupils are equal, round,  and reactive to light.  Cardiovascular:     Rate and Rhythm: Normal rate and regular rhythm.     Pulses: Normal pulses.     Heart sounds: Normal heart sounds.  Pulmonary:     Effort: Pulmonary effort is normal.     Breath sounds: Normal breath sounds.  Skin:    General: Skin is warm.  Neurological:     General: No focal deficit present.     Mental Status: She is alert and oriented to person, place, and time. Mental status is at baseline.      Results for orders placed or performed in visit on 10/11/22  POCT Glucose (CBG)  Result Value Ref Range   POC Glucose 99 70 - 99 mg/dl  POCT HgB A1C  Result Value Ref Range   Hemoglobin A1C 6.5 (A) 4.0 - 5.6 %   HbA1c POC (<> result, manual entry)     HbA1c, POC (prediabetic range)     HbA1c, POC (controlled diabetic range)      Last CBC Lab Results  Component Value Date   WBC 6.8 01/23/2022   HGB 12.9 01/23/2022   HCT 41.2 01/23/2022   MCV 93.0 01/23/2022   MCH 29.1 01/23/2022   RDW 12.7 01/23/2022   PLT 244 46/65/9935   Last metabolic panel Lab Results  Component Value Date   GLUCOSE 205 (H) 04/05/2022  NA 138 04/05/2022   K 4.1 04/05/2022   CL 102 04/05/2022   CO2 24 04/05/2022   BUN 7 (L) 04/05/2022   CREATININE 0.76 04/05/2022   EGFR 88 04/05/2022   CALCIUM 9.5 04/05/2022   PROT 6.7 04/05/2022   ALBUMIN 4.0 04/05/2022   LABGLOB 2.7 04/05/2022   AGRATIO 1.5 04/05/2022   BILITOT <0.2 04/05/2022   ALKPHOS 95 04/05/2022   AST 33 04/05/2022   ALT 25 04/05/2022   ANIONGAP 9 01/23/2022   Last lipids Lab Results  Component Value Date   CHOL 210 (H) 03/15/2022   HDL 63 03/15/2022   LDLCALC 126 (H) 03/15/2022   TRIG 118 03/15/2022   CHOLHDL 3.3 03/15/2022   Last hemoglobin A1c Lab Results  Component Value Date   HGBA1C 6.5 (A) 10/11/2022   Last thyroid functions Lab Results  Component Value Date   TSH 1.890 03/15/2022   T4TOTAL 7.5 07/28/2020      The 10-year ASCVD risk score (Arnett DK, et al.,  2019) is: 16.8%    Assessment & Plan:   1. Type 2 diabetes mellitus with diabetic neuropathy, without long-term current use of insulin (HCC) - Her Diabetes is under control, she will continue current medication, low carb/non concentrated sweet diet. - POCT Glucose (CBG) - POCT HgB A1C  2. Insomnia, unspecified type - She will continue on Trazodone, advised to maintain proper sleep hygiene. - traZODone (DESYREL) 50 MG tablet; Take 3 tablets (150 mg total) by mouth once daily at bedtime.  Dispense: 90 tablet; Refill: 1  3. Essential hypertension - Her blood pressure is not under control, she will continue on current medication, DASH diet and exercise as tolerated. She was provided with blood pressure machine and advised to check it daily, keep log and bring to follow up appointment. She was educated on the signs and symptoms of stroke and advised to go to the ED. - Blood Pressure KIT; 1 kit by Does not apply route daily.  Dispense: 1 kit; Refill: 0    Return in about 4 weeks (around 11/08/2022), or if symptoms worsen or fail to improve.    Crispin Vogel Jerold Coombe, NP

## 2022-10-18 ENCOUNTER — Ambulatory Visit: Payer: Self-pay | Admitting: Gerontology

## 2022-10-18 VITALS — BP 125/73 | HR 83 | Wt 151.5 lb

## 2022-10-18 DIAGNOSIS — I10 Essential (primary) hypertension: Secondary | ICD-10-CM

## 2022-10-18 NOTE — Progress Notes (Signed)
Established Patient Office Visit  Subjective   Patient ID: Amy Knapp, female    DOB: 04-12-1956  Age: 67 y.o. MRN: [id number]  No chief complaint on file.   HPI  Simisola Sandles  is a 67 y/o female with a history of Allergy, Anxiety, Arthritis, Asthma, COPD, Depression, Diabetes, who presents for uncontrolled blood pressure. She states that her blood pressure has been elevated between 170-200/100-110 since her last visit. She states that she's compliant with her medications, denies headache, chest pain, palpitation, light headedness and vision changes. She states that her stress level is bearable since she's currently staying with a friend, and sleeping on the couch, and admits to getting 6-7 hours of sleep every night. Her blood pressure during visit was 125/73 and 127/76. Overall, she states that she's doing well and offers no further complaint.  Past Medical History:  Diagnosis Date   Allergy    Anxiety    Arthritis    Asthma    Broken leg 2007   Right   Cataract    Complex renal cyst 06/24/2021   COPD (chronic obstructive pulmonary disease) (HCC)    Depression    Diabetes mellitus without complication (HCC)    Emphysema of lung ([place])    Essential hypertension 04/20/2021   GERD (gastroesophageal reflux disease)    Gout    History of hiatal hernia    Hyperlipidemia    Hypothyroidism    Sleep apnea    Thyroid disease    Past Surgical History:  Procedure Laterality Date   ABDOMINAL HYSTERECTOMY  2000   Partial   COLONOSCOPY WITH PROPOFOL N/A 04/11/2021   Procedure: COLONOSCOPY WITH PROPOFOL;  Surgeon: Lucilla Lame, MD;  Location: [facility] ENDOSCOPY;  Service: Endoscopy;  Laterality: N/A;   ESOPHAGOGASTRODUODENOSCOPY N/A 04/11/2021   Procedure: ESOPHAGOGASTRODUODENOSCOPY (EGD);  Surgeon: Lucilla Lame, MD;  Location: [facility] ENDOSCOPY;  Service: Endoscopy;  Laterality: N/A;   HERNIA REPAIR     INSERTION OF MESH  08/29/2021   Procedure: INSERTION OF MESH;   Surgeon: Jules Husbands, MD;  Location: [facility] ORS;  Service: General;;   LEG SURGERY Right 10/08/2005   broken right lower leg; plate and screws   PATELLA FRACTURE SURGERY Right    TUBAL LIGATION     XI ROBOTIC ASSISTED HIATAL HERNIA REPAIR N/A 08/29/2021   Procedure: XI ROBOTIC [place], RNFA to assist;  Surgeon: Jules Husbands, MD;  Location: [facility] ORS;  Service: General;  Laterality: N/A;   Social History   Tobacco Use   Smoking status: Former    Packs/day: 0.15    Types: Cigarettes    Quit date: 04/09/2021    Years since quitting: 1.5   Smokeless tobacco: Former    Quit date: 1973   Tobacco comments:    Smoking 2 cigarettes/day    Patient dipped snuff and used smokeless tobacco when she was very young. Patient stopped at age 33.    Pack last 3 weeks        Quit a month ago. Feels good with quitting.   Vaping Use   Vaping Use: Never used  Substance Use Topics   Alcohol use: No   Drug use: No   Family History  Problem Relation Age of Onset   Cancer Mother    Heart attack Father    Cancer Brother        colon   Heart attack Brother 55   Diabetes Daughter    Diabetes Son  Hypertension Maternal Aunt    Breast cancer Neg Hx    Allergies  Allergen Reactions   Celery Oil     itching   Demerol [Meperidine]     Burning, feels hot   Morphine And Related Other (See Comments)    Muscle Spasms   Penicillins     blisters      Review of Systems  Constitutional: Negative.   Eyes: Negative.   Respiratory: Negative.    Cardiovascular: Negative.   Neurological: Negative.       Objective:     BP 125/73 (BP Location: Right Arm, Patient Position: Sitting, Cuff Size: Large)   Pulse 83   Wt 151 lb 8 oz (68.7 kg)   BMI 28.16 kg/m  BP Readings from Last 3 Encounters:  10/18/22 125/73  10/11/22 (!) 152/77  09/19/22 129/74   Wt Readings from Last 3 Encounters:  10/18/22 151 lb 8 oz (68.7 kg)  10/11/22 150 lb (68 kg)  09/19/22 147 lb 4.8 oz  (66.8 kg)      Physical Exam HENT:     Head: Normocephalic and atraumatic.     Mouth/Throat:     Mouth: Mucous membranes are moist.  Eyes:     Extraocular Movements: Extraocular movements intact.     Conjunctiva/sclera: Conjunctivae normal.     Pupils: Pupils are equal, round, and reactive to light.  Cardiovascular:     Rate and Rhythm: Normal rate and regular rhythm.     Pulses: Normal pulses.     Heart sounds: Normal heart sounds.  Pulmonary:     Effort: Pulmonary effort is normal.     Breath sounds: Normal breath sounds.  Neurological:     General: No focal deficit present.     Mental Status: She is alert and oriented to person, place, and time. Mental status is at baseline.  Psychiatric:        Mood and Affect: Mood normal.        Behavior: Behavior normal.        Thought Content: Thought content normal.        Judgment: Judgment normal.      No results found for any visits on 10/18/22.  Last CBC Lab Results  Component Value Date   WBC 6.8 01/23/2022   HGB 12.9 01/23/2022   HCT 41.2 01/23/2022   MCV 93.0 01/23/2022   MCH 29.1 01/23/2022   RDW 12.7 01/23/2022   PLT 244 01/23/2022   Last metabolic panel Lab Results  Component Value Date   GLUCOSE 205 (H) 04/05/2022   NA 138 04/05/2022   K 4.1 04/05/2022   CL 102 04/05/2022   CO2 24 04/05/2022   BUN 7 (L) 04/05/2022   CREATININE 0.76 04/05/2022   EGFR 88 04/05/2022   CALCIUM 9.5 04/05/2022   PROT 6.7 04/05/2022   ALBUMIN 4.0 04/05/2022   LABGLOB 2.7 04/05/2022   AGRATIO 1.5 04/05/2022   BILITOT <0.2 04/05/2022   ALKPHOS 95 04/05/2022   AST 33 04/05/2022   ALT 25 04/05/2022   ANIONGAP 9 01/23/2022   Last lipids Lab Results  Component Value Date   CHOL 210 (H) 03/15/2022   HDL 63 03/15/2022   LDLCALC 126 (H) 03/15/2022   TRIG 118 03/15/2022   CHOLHDL 3.3 03/15/2022   Last hemoglobin A1c Lab Results  Component Value Date   HGBA1C 6.5 (A) 10/11/2022   Last thyroid functions Lab Results   Component Value Date   TSH 1.890 03/15/2022   T4TOTAL 7.5 07/28/2020  The 10-year ASCVD risk score (Arnett DK, et al., 2019) is: 11.6%    Assessment & Plan:   1. Essential hypertension - Her blood pressure was normal during visit, she will continue on current medication. -Low salt DASH diet -Take medications regularly on time -Exercise regularly as tolerated -Check blood pressure at least once a daily when relaxed at home or a nearby pharmacy and record -Goal is less than 140/90 and normal blood pressure is 120/80 -She was educated on the signs and symptoms of stroke and advised to go to the ED. - Urine Microalbumin w/creat. ratio; Future    Return in about 3 weeks (around 11/08/2022).    Posey Jasmin Jerold Coombe, NP

## 2022-10-18 NOTE — Patient Instructions (Signed)

## 2022-10-19 LAB — MICROALBUMIN / CREATININE URINE RATIO
Creatinine, Urine: 83.4 mg/dL
Microalb/Creat Ratio: 14 mg/g creat (ref 0–29)
Microalbumin, Urine: 11.5 ug/mL

## 2022-10-30 NOTE — BH Specialist Note (Signed)
Integrated Behavioral Health Follow Up In-Person Visit  MRN: [id number] Name: Amy Knapp  Number of [place] Clinician visits: No data recorded Session Start time: No data recorded  Session End time: No data recorded Total time in minutes: No data recorded  Types of Service: Individual psychotherapy  Interpretor:No. Interpretor Name and Language: N/A  Subjective: Amy Knapp is a 67 y.o. female accompanied by  herself Patient was referred by [name], NP for mental health. Patient reports the following symptoms/concerns: The patient reported that she has been doing okay since her last follow up appointment. Amy Knapp shared that she moved in with friends because she could not afford the latest rent increase on her apartment.  The patient noted that while she is grateful to have a temporary place to live with friends she is not used to living with others and finds the transition to be very difficult.  The patient noted that she continues to put in several applications for housing but is not receiving any callbacks.  The patient shared she continues to struggle with grief and waves of sadness regarding the losses in her life.  She stated that she relies on her faith to pull her through difficult times and enjoys helping others as well.  Amy Knapp shared that she will file for early retirement at the end of this month because she feels she is no longer able to work full-time.  The patient denied any suicidal or homicidal thoughts. Duration of problem: Years; Severity of problem: moderate  Objective: Mood: Euphoric and Affect: Appropriate Risk of harm to self or others: No plan to harm self or others  Life Context: Family and Social: see above School/Work: see above Self-Care: see above Life Changes: see above  Patient and/or Family's Strengths/Protective Factors: Social connections, Concrete supports in place (healthy food, safe  environments, etc.), Sense of purpose, and Physical Health (exercise, healthy diet, medication compliance, etc.)  Goals Addressed: Patient will:  Reduce symptoms of: agitation, anxiety, depression, insomnia, and stress   Increase knowledge and/or ability of: coping skills, healthy habits, self-management skills, and stress reduction   Demonstrate ability to: Increase healthy adjustment to current life circumstances and Increase adequate support systems for patient/family  Progress towards Goals: Ongoing  Interventions: Interventions utilized:  CBT Cognitive Behavioral Therapywas utilized by the clinician during today's follow up session. Clinician met with patient to identify needs related to stressors and functioning, and assess and monitor for signs and symptoms of anxiety and depression, and assess safety. The clinician processed with the patient how they have been doing since the last follow-up session. Clinician provided Psychoeducation regarding grief and shared resources with the patient for support groups in her area.Clinician encouraged the patient to continue to work on calming techniques (e.g.. paced breathing, deep muscle relaxation, and calming imagery) as a strategy for responding appropriately to anxiety and depression and move towards increasing the patients self regulation.  The session ended with scheduling. Standardized Assessments completed:  Due to time constraints will access at next follow up session.   Assessment: Patient currently experiencing see above.   Patient may benefit from see above.  Plan: Follow up with behavioral health clinician on : 11/08/2022 Behavioral recommendations:  Referral(s): [place] (In Clinic) "From scale of 1-10, how likely are you to follow plan?":   Lesli Albee, LCSWA

## 2022-11-05 ENCOUNTER — Other Ambulatory Visit: Payer: Self-pay

## 2022-11-08 ENCOUNTER — Ambulatory Visit: Payer: Self-pay | Admitting: Gerontology

## 2022-11-08 ENCOUNTER — Other Ambulatory Visit: Payer: Self-pay

## 2022-11-08 ENCOUNTER — Ambulatory Visit: Payer: Self-pay | Admitting: Licensed Clinical Social Worker

## 2022-11-08 ENCOUNTER — Encounter: Payer: Self-pay | Admitting: Gerontology

## 2022-11-08 VITALS — BP 124/66 | HR 78 | Temp 98.1°F | Resp 16 | Ht 61.5 in | Wt 150.2 lb

## 2022-11-08 DIAGNOSIS — R300 Dysuria: Secondary | ICD-10-CM

## 2022-11-08 DIAGNOSIS — I10 Essential (primary) hypertension: Secondary | ICD-10-CM

## 2022-11-08 DIAGNOSIS — Z0000 Encounter for general adult medical examination without abnormal findings: Secondary | ICD-10-CM

## 2022-11-08 DIAGNOSIS — F411 Generalized anxiety disorder: Secondary | ICD-10-CM

## 2022-11-08 DIAGNOSIS — E785 Hyperlipidemia, unspecified: Secondary | ICD-10-CM

## 2022-11-08 MED ORDER — LOSARTAN POTASSIUM 25 MG PO TABS
25.0000 mg | ORAL_TABLET | Freq: Every day | ORAL | 1 refills | Status: DC
  Filled 2022-11-08 – 2023-03-11 (×2): qty 90, 90d supply, fill #0
  Filled 2023-06-11: qty 90, 90d supply, fill #1

## 2022-11-08 MED ORDER — ATORVASTATIN CALCIUM 10 MG PO TABS
10.0000 mg | ORAL_TABLET | Freq: Every day | ORAL | 1 refills | Status: DC
  Filled 2022-11-08: qty 90, fill #0
  Filled 2023-03-11: qty 90, 90d supply, fill #0
  Filled 2023-06-11: qty 90, 90d supply, fill #1

## 2022-11-08 NOTE — Progress Notes (Signed)
Established Patient Office Visit  Subjective   Patient ID: Amy Knapp, female    DOB: [birth year]  Age: 67 y.o. MRN: [id number]  Chief Complaint  Patient presents with   Follow-up   Hypertension    Patient states BP today was 95/78    HPI  Amy Knapp is a 67 y/o female with a history of Allergy, Anxiety, Arthritis, Asthma, COPD, Depression, and Diabetes who presents for blood pressure follow up. She states that her blood pressure fluctuates. She checks her blood pressure twice a day at home. She had a hypotensive episode of 66/42 mmHg. At that time she felt dizzy, nausea and had to sit down, denies passing out. She states her blood pressure also runs hypertensive, 186/115 mmHg yesterday (11/07/2022) afternoon and 216/179 mmHg on 11/06/22. She developed headache, chest pain, shortness of breath, and tingling in her hands and feet. She states she is routinely taking Losartan 25 mg once a day in the morning.  She ran out of her blood pressure medication on 10/30/2022. While visiting her grand daughter on 11/01/2022, she took her granddaughter's blood pressure medication, but don't know the name of medication. During visit, her blood pressure 124/66, she denies chest pain, palpitation, light headedness and vision changes. She also reports dysuria, urinary frequency, and  urine dark yellow color, but denies flank nor pelvic pain. Overall, she states that she's doing well and offers no further complaint.  Review of Systems  Constitutional: Negative.   HENT: Negative.  Negative for hearing loss.   Eyes: Negative.   Respiratory: Negative.    Cardiovascular: Negative.  Negative for chest pain.  Gastrointestinal:  Positive for nausea (Reports nausea with hypotension.).  Musculoskeletal: Negative.   Skin: Negative.   Neurological:  Positive for tingling (reports tingling in feet and hands.) and headaches (Reports headaches when blood pressure is low and high). Negative for tremors.   Endo/Heme/Allergies: Negative.   Psychiatric/Behavioral: Negative.        Objective:     BP 124/66 (BP Location: Left Arm, Patient Position: Sitting, Cuff Size: Normal)   Pulse 78   Temp 98.1 F (36.7 C) (Oral)   Resp 16   Ht 5' 1.5" (1.562 m)   Wt 150 lb 3.2 oz (68.1 kg)   SpO2 93%   BMI 27.92 kg/m  BP Readings from Last 3 Encounters:  11/08/22 124/66  10/18/22 125/73  10/11/22 (!) 152/77   Wt Readings from Last 3 Encounters:  11/08/22 150 lb 3.2 oz (68.1 kg)  10/18/22 151 lb 8 oz (68.7 kg)  10/11/22 150 lb (68 kg)      Physical Exam Constitutional:      Appearance: Normal appearance.  HENT:     Head: Normocephalic.  Cardiovascular:     Rate and Rhythm: Normal rate and regular rhythm.     Pulses: Normal pulses.     Heart sounds: Normal heart sounds.  Pulmonary:     Effort: Pulmonary effort is normal.     Breath sounds: Normal breath sounds.  Abdominal:     General: Abdomen is flat. Bowel sounds are normal.     Palpations: Abdomen is soft.  Skin:    General: Skin is warm and dry.  Neurological:     Mental Status: She is alert.  Psychiatric:        Mood and Affect: Mood normal.        Behavior: Behavior normal.        Thought Content: Thought content normal.  Judgment: Judgment normal.      No results found for any visits on 11/08/22.  Last CBC Lab Results  Component Value Date   WBC 6.8 01/23/2022   HGB 12.9 01/23/2022   HCT 41.2 01/23/2022   MCV 93.0 01/23/2022   MCH 29.1 01/23/2022   RDW 12.7 01/23/2022   PLT 244 40/81/4481   Last metabolic panel Lab Results  Component Value Date   GLUCOSE 205 (H) 04/05/2022   NA 138 04/05/2022   K 4.1 04/05/2022   CL 102 04/05/2022   CO2 24 04/05/2022   BUN 7 (L) 04/05/2022   CREATININE 0.76 04/05/2022   EGFR 88 04/05/2022   CALCIUM 9.5 04/05/2022   PROT 6.7 04/05/2022   ALBUMIN 4.0 04/05/2022   LABGLOB 2.7 04/05/2022   AGRATIO 1.5 04/05/2022   BILITOT <0.2 04/05/2022   ALKPHOS 95  04/05/2022   AST 33 04/05/2022   ALT 25 04/05/2022   ANIONGAP 9 01/23/2022   Last lipids Lab Results  Component Value Date   CHOL 210 (H) 03/15/2022   HDL 63 03/15/2022   LDLCALC 126 (H) 03/15/2022   TRIG 118 03/15/2022   CHOLHDL 3.3 03/15/2022   Last hemoglobin A1c Lab Results  Component Value Date   HGBA1C 6.5 (A) 10/11/2022   Last thyroid functions Lab Results  Component Value Date   TSH 1.890 03/15/2022   T4TOTAL 7.5 07/28/2020   Last vitamin D No results found for: "25OHVITD2", "25OHVITD3", "VD25OH"    The 10-year ASCVD risk score (Arnett DK, et al., 2019) is: 11.5%    Assessment & Plan:  1. Dysuria -Urinalysis obtained during today's visit.  -She was encouraged to drink plenty water.  - UA/M w/rflx Culture, Routine; Future - UA/M w/rflx Culture, Routine  2. Hyperlipidemia, unspecified hyperlipidemia type -She will continue on current medication as prescribed.  - atorvastatin (LIPITOR) 10 MG tablet; Take 1 tablet (10 mg total) by mouth daily.  Dispense: 90 tablet; Refill: 1  3. Health care maintenance -Continue making healthy lifestyle modifications and routine labs will be checked.  - Comp Met (CMET); Future - CBC w/Diff; Future - HgB A1c; Future - Lipid panel; Future  4. Essential hypertension - Her blood pressure was normal during visit, she will continue on current medication. -She was educated and given a flyer of symptoms of stroke. Pt was advised to go to the ED if stroke symptoms arise.  -She was encouraged to follow a DASH diet and exercise as tolerated. -She was advised to continue to monitor her blood pressure two times daily and to bring blood pressure record to next appointment in addition to blood pressure machine.  - losartan (COZAAR) 25 MG tablet; Take 1 tablet (25 mg total) by mouth once daily.  Dispense: 90 tablet; Refill: 1     Follow up in clinic on 02/06/2023.   Sharon Mt, FNP student

## 2022-11-08 NOTE — Patient Instructions (Signed)

## 2022-11-08 NOTE — BH Specialist Note (Signed)
Integrated Behavioral Health Follow Up In-Person Visit  MRN: [id number] Name: Amy Knapp  Number of [place] Clinician visits: No data recorded Session Start time: No data recorded  Session End time: No data recorded Total time in minutes: No data recorded  Types of Service: Individual psychotherapy  Interpretor:No. Interpretor Name and Language: N/A  Subjective: Amy Knapp is a 67 y.o. female accompanied by  herself Patient was referred by [name], NP for mental health. Patient reports the following symptoms/concerns: The patient reports that she has been doing okay since her last follow-up appointment. She discussed increasing situational stressors that are impacting her life currently. Ms. Kempe reported  that she has been struggling to fall asleep and stay asleep. The patient endorsed an overall irritable mood. She explained that she is looking forward to having some time to herself.. The patient denied any suicidal or homicidal thoughts.  Duration of problem: Years; Severity of problem: moderate  Objective: Mood: Euthymic and Affect: Appropriate Risk of harm to self or others: No plan to harm self or others  Life Context: Family and Social: see above School/Work: see above Self-Care: see above Life Changes: see above  Patient and/or Family's Strengths/Protective Factors: Sense of purpose, Physical Health (exercise, healthy diet, medication compliance, etc.), and Caregiver has knowledge of parenting & child development  Goals Addressed: Patient will:  Reduce symptoms of: agitation, anxiety, depression, insomnia, and stress   Increase knowledge and/or ability of: coping skills, healthy habits, self-management skills, and stress reduction   Demonstrate ability to: Increase healthy adjustment to current life circumstances  Progress towards Goals: Ongoing  Interventions: Interventions utilized:  CBT Cognitive  Behavioral Therapywas utilized by the clinician during today's follow up session. Clinician met with patient to identify needs related to stressors and functioning, and assess and monitor for signs and symptoms of anxiety and depression, and assess safety. The clinician processed with the patient how they have been doing since the last follow-up session. Clinician intervened with positive regard and optimism to validate client's emotions, and supported client in exploring ways to access community resources. Clinician provided a safe space for the patient to vent her frustrations regarding her current life circumstances. The session ended with scheduling.  Standardized Assessments completed: GAD-7 and PHQ 9 GAD-7=12 PHQ-9= 14   Assessment: Patient currently experiencing see above.   Patient may benefit from see above.  Plan: Follow up with behavioral health clinician on : 11/20/2022 at 4:00 PM  Behavioral recommendations:  Referral(s): [place] (In Clinic) "From scale of 1-10, how likely are you to follow plan?":   Lesli Albee, LCSWA

## 2022-11-09 LAB — UA/M W/RFLX CULTURE, ROUTINE
Bilirubin, UA: NEGATIVE
Glucose, UA: NEGATIVE
Ketones, UA: NEGATIVE
Leukocytes,UA: NEGATIVE
Nitrite, UA: NEGATIVE
Protein,UA: NEGATIVE
RBC, UA: NEGATIVE
Specific Gravity, UA: 1.01 (ref 1.005–1.030)
Urobilinogen, Ur: 0.2 mg/dL (ref 0.2–1.0)
pH, UA: 6.5 (ref 5.0–7.5)

## 2022-11-09 LAB — MICROSCOPIC EXAMINATION
Bacteria, UA: NONE SEEN
Casts: NONE SEEN /lpf
Epithelial Cells (non renal): NONE SEEN /hpf (ref 0–10)
RBC, Urine: NONE SEEN /hpf (ref 0–2)
WBC, UA: NONE SEEN /hpf (ref 0–5)

## 2022-11-13 ENCOUNTER — Other Ambulatory Visit: Payer: Self-pay

## 2022-11-20 ENCOUNTER — Ambulatory Visit: Payer: Self-pay | Admitting: Licensed Clinical Social Worker

## 2022-11-20 ENCOUNTER — Encounter: Payer: Self-pay | Admitting: Licensed Clinical Social Worker

## 2022-11-20 DIAGNOSIS — F411 Generalized anxiety disorder: Secondary | ICD-10-CM

## 2022-11-20 NOTE — BH Specialist Note (Signed)
Integrated Behavioral Health via Telemedicine Visit  11/20/2022 Amy Knapp [id number]  Number of Integrated Behavioral Health Clinician visits: No data recorded Session Start time: No data recorded  Session End time: No data recorded Total time in minutes: No data recorded  Referring Provider: [name], NP Patient/Family location: [facility] Provider location: [facility]  All persons participating in visit: Amy Knapp and Amy Cairo, LCSW-A Types of Service: Telephone visit  I connected with Amy Knapp via  Telephone or Video Enabled Telemedicine Application  (Video is Caregility application) and verified that I am speaking with the correct person using two identifiers. Discussed confidentiality: Yes   I discussed the limitations of telemedicine and the availability of in person appointments.  Discussed there is a possibility of technology failure and discussed alternative modes of communication if that failure occurs.  Patient and/or legal guardian expressed understanding and consented to Telemedicine visit: Yes   Presenting Concerns: Patient and/or family reports the following symptoms/concerns: The patient reports that she has been doing the same since her last follow-up session. She discussed family and other situational stressors impacting her life.  The patient noted that she is considering retiring .She noted that she has enjoyed being active in her church and helping others. She explained that she feels wore down and has not been focusing on her self care lately. She noted she decided to take another spa night for herself sometime soon. [name] denied any mood changes, suicidal or homicidal thoughts.  Duration of problem: Years; Severity of problem: moderate  Patient and/or Family's Strengths/Protective Factors: Concrete supports in place (healthy food, safe environments, etc.) and Sense of purpose  Goals  Addressed: Patient will:  Reduce symptoms of: agitation, anxiety, depression, insomnia, and stress   Increase knowledge and/or ability of: coping skills, healthy habits, self-management skills, and stress reduction   Demonstrate ability to: Increase healthy adjustment to current life circumstances  Progress towards Goals: Ongoing  Interventions: Interventions utilized:  CBT Cognitive Behavioral Therapy was utilized by the clinician during today's follow up session. Clinician met with patient to identify needs related to stressors and functioning, and assess and monitor for signs and symptoms of anxiety and depression, and assess safety. The clinician processed with the patient how they have been doing since the last follow-up session. Clinician measured the patients anxiety and depression on a numerical scale.  Clinician encouraged the patient to continue to focus on intentional self care and implementing her coping skills to deal with her current life circumstances. The session ended with scheduling.  Standardized Assessments completed: GAD-7 and PHQ 9 GAD-7=13  PHQ-9=10  Patient and/or Family Response: The patient is agreeable to the plan   Assessment: Patient currently experiencing see above.   Patient may benefit from see above.  Plan: Follow up with behavioral health clinician on : 12/04/2022 at 9:00 Behavioral recommendations:  Referral(S): [place] (In Clinic)  I discussed the assessment and treatment plan with the patient and/or parent/guardian. They were provided an opportunity to ask questions and all were answered. They agreed with the plan and demonstrated an understanding of the instructions.   They were advised to call back or seek an in-person evaluation if the symptoms worsen or if the condition fails to improve as anticipated.  Lesli Albee, LCSWA

## 2022-11-22 ENCOUNTER — Ambulatory Visit: Payer: Self-pay | Admitting: Gerontology

## 2022-11-29 ENCOUNTER — Other Ambulatory Visit: Payer: Self-pay

## 2022-12-03 ENCOUNTER — Telehealth: Payer: Self-pay | Admitting: Licensed Clinical Social Worker

## 2022-12-03 NOTE — Telephone Encounter (Signed)
Called the patient to inform her that her appointment on 12/04/2022 at 9:00 AM had been moved to 12/11/2022 at 9:00 AM; no answer, left voicemail

## 2022-12-04 ENCOUNTER — Ambulatory Visit: Payer: Self-pay | Admitting: Licensed Clinical Social Worker

## 2022-12-11 ENCOUNTER — Ambulatory Visit: Payer: Self-pay | Admitting: Licensed Clinical Social Worker

## 2022-12-23 NOTE — BH Specialist Note (Deleted)
Integrated Behavioral Health Follow Up In-Person Visit  MRN: [id number] Name: Amy Knapp  Number of [place] Clinician visits: No data recorded Session Start time: No data recorded  Session End time: No data recorded Total time in minutes: No data recorded  Types of Service: Telephone visit  Interpretor:No. Interpretor Name and Language: N/A  Subjective: Amy Knapp is a 67 y.o. female accompanied by  herself Patient was referred by [name], NP for mental health. Patient reports the following symptoms/concerns: *** Duration of problem: Years; Severity of problem: moderate  Objective: Mood: Euthymic and Affect: Appropriate Risk of harm to self or others: No plan to harm self or others  Life Context: Family and Social: see above School/Work: see above Self-Care: see above Life Changes: see above  Patient and/or Family's Strengths/Protective Factors: Social connections, Sense of purpose, Physical Health (exercise, healthy diet, medication compliance, etc.), and Caregiver has knowledge of parenting & child development  Goals Addressed: Patient will:  Reduce symptoms of: agitation, anxiety, depression, and stress   Increase knowledge and/or ability of: coping skills, healthy habits, self-management skills, and stress reduction   Demonstrate ability to: Increase healthy adjustment to current life circumstances  Progress towards Goals: Ongoing  Interventions: Interventions utilized:  Supportive Counselingwas utilized by the clinician during today's follow up session. Clinician met with patient to identify needs related to stressors and functioning, and assess and monitor for signs and symptoms of anxiety and depression, and assess safety. The clinician processed with the patient how they have been doing since the last follow-up session.Clinician encouraged the patient to utilize her coping skills to deal with her current life  circumstances. Clinician provided the patient with information regarding Medicaid eligibility and encouraged the patient to apply. The session ended with scheduling.  Standardized Assessments completed: GAD-7 and PHQ 9 GAD-7=13 PHQ-9= 10  Patient and/or Family Response: Patient expressed understanding and agreed with the plan.  Assessment: Patient currently experiencing see above.   Patient may benefit from se.  Plan: Follow up with behavioral health clinician on : *** Behavioral recommendations: *** Referral(s): {IBH Referrals:[id number]} "From scale of 1-10, how likely are you to follow plan?": ***  Lesli Albee, LCSWA

## 2022-12-24 NOTE — Progress Notes (Signed)
This encounter was created in error - please disregard.

## 2022-12-25 ENCOUNTER — Telehealth: Payer: Self-pay | Admitting: Licensed Clinical Social Worker

## 2022-12-25 NOTE — Telephone Encounter (Signed)
Called the patient to rescheduled her missed appointment from 12/11/2022. Patient explained she was stuck at work and was unable to make it to her last appointment. Her next appointment is on 01/01/2023 at 6:00 PM. The patient stated that she is doing well overall but requested that clinician message her provider to request a refill of Trazodone. Clinician relayed the patient's medication request to Sonnie Alamo, [place].

## 2023-01-01 ENCOUNTER — Ambulatory Visit: Payer: Self-pay | Admitting: Licensed Clinical Social Worker

## 2023-01-03 ENCOUNTER — Ambulatory Visit: Payer: Self-pay | Admitting: Gerontology

## 2023-01-03 ENCOUNTER — Encounter: Payer: Self-pay | Admitting: Gerontology

## 2023-01-03 ENCOUNTER — Other Ambulatory Visit: Payer: Self-pay

## 2023-01-03 ENCOUNTER — Ambulatory Visit: Payer: Self-pay

## 2023-01-03 VITALS — BP 105/67 | HR 86 | Temp 98.1°F | Resp 24 | Ht 61.5 in | Wt 144.1 lb

## 2023-01-03 DIAGNOSIS — R051 Acute cough: Secondary | ICD-10-CM

## 2023-01-03 DIAGNOSIS — G4700 Insomnia, unspecified: Secondary | ICD-10-CM

## 2023-01-03 DIAGNOSIS — H6690 Otitis media, unspecified, unspecified ear: Secondary | ICD-10-CM | POA: Insufficient documentation

## 2023-01-03 MED ORDER — PREDNISONE 10 MG PO TABS
10.0000 mg | ORAL_TABLET | Freq: Every day | ORAL | 0 refills | Status: DC
  Filled 2023-01-03: qty 5, 5d supply, fill #0

## 2023-01-03 MED ORDER — AZITHROMYCIN 250 MG PO TABS
ORAL_TABLET | ORAL | 0 refills | Status: DC
  Filled 2023-01-03: qty 6, 5d supply, fill #0

## 2023-01-03 MED ORDER — TRAZODONE HCL 50 MG PO TABS
150.0000 mg | ORAL_TABLET | Freq: Every day | ORAL | 1 refills | Status: DC
  Filled 2023-01-03: qty 90, 30d supply, fill #0
  Filled 2023-02-07: qty 90, 30d supply, fill #1

## 2023-01-03 MED ORDER — BENZONATATE 100 MG PO CAPS
100.0000 mg | ORAL_CAPSULE | Freq: Three times a day (TID) | ORAL | 0 refills | Status: DC | PRN
  Filled 2023-01-03: qty 20, 7d supply, fill #0

## 2023-01-03 NOTE — Progress Notes (Signed)
Established Patient Office Visit  Subjective   Patient ID: Amy Knapp, female    DOB: 1956/02/24  Age: 67 y.o. MRN: [id number]  Chief Complaint  Patient presents with   Cough    Patient c/o productive cough (yellow), chest congestion and wheezing x 3-4 days. Patient has been taking Robitussin and Tylenol without relief. Patient hasn't taken temperature.   Ear Pain    Patient c/o left ear pain off & on x 3 months    Cough Associated symptoms include chest pain (complains of cest tightness when she coughs), chills, ear pain (left ear), headaches, a sore throat, shortness of breath and wheezing.   Amy Knapp is a 67 y/o female with a history of Allergy, Anxiety, Arthritis, Asthma, COPD, Depression, and Diabetes who presents today with a chief complain of productive cough with yellowish phlegm, congestion, chest tightness and wheezing that started 4 days ago. She denies recent exposure to covid or upper respiratory symptoms  and sick contacts. Also she c/o left otalgia that started about 3 months ago. She describes it as  throbbing pain and it stays sore. She states that otalgia is associated with tinnitus and mastoid pain. She denies loss of hearing, recent injury to her ear and discharge.  Patient reports taking over the counter Robitussin and Tylenol which has not been helpful. Reports using her inhalers as prescribed. Though she denies fever, chills, but she's not in acute distress that warrants immediate intervention.      Patient Active Problem List   Diagnosis Date Noted   Otitis media 01/03/2023   Acute bronchitis with COPD ([place]) 09/19/2022   Sinusitis 09/19/2022   Headache 08/23/2022   Cough 04/05/2022   Nasal congestion with rhinorrhea 04/05/2022   Right flank pain 03/22/2022   Thickened nails 02/01/2022   Left-sided chest pain 11/02/2021   S/P repair of paraesophageal hernia 08/29/2021   Complex renal cyst 06/24/2021   Vision abnormalities 06/23/2021    Sleep concern 06/22/2021   Lump of skin of back 05/18/2021   Hiatal hernia 04/20/2021   Essential hypertension 04/20/2021   Diarrhea    Dysphagia    Right knee pain 03/23/2021   Acute leg pain, left 12/22/2020   Type 2 diabetes mellitus with diabetic neuropathy, unspecified ([place]) 03/10/2020   Nipple discharge 03/10/2020   Flu vaccine need 11/13/2016   Hyperlipidemia 08/14/2016   Edema extremities 08/14/2016   Back pain 12/09/2014   Hypothyroidism 11/11/2014   COPD (chronic obstructive pulmonary disease) ([place]) 11/11/2014   Gout 11/11/2014   Seborrheic keratosis 10/28/2014   Diabetes ([place]) 09/14/2014   Sleep apnea 06/29/2014   Past Medical History:  Diagnosis Date   Allergy    Anxiety    Arthritis    Asthma    Broken leg 2007   Right   Cataract    Complex renal cyst 06/24/2021   COPD (chronic obstructive pulmonary disease) ([place])    Depression    Diabetes mellitus without complication (HCC)    Emphysema of lung ([place])    Essential hypertension 04/20/2021   GERD (gastroesophageal reflux disease)    Gout    History of hiatal hernia    Hyperlipidemia    Hypothyroidism    Sleep apnea    Thyroid disease    Past Surgical History:  Procedure Laterality Date   ABDOMINAL HYSTERECTOMY  2000   Partial   COLONOSCOPY WITH PROPOFOL N/A 04/11/2021   Procedure: COLONOSCOPY WITH PROPOFOL;  Surgeon: Lucilla Lame, MD;  Location: [facility] ENDOSCOPY;  Service:  Endoscopy;  Laterality: N/A;   ESOPHAGOGASTRODUODENOSCOPY N/A 04/11/2021   Procedure: ESOPHAGOGASTRODUODENOSCOPY (EGD);  Surgeon: Lucilla Lame, MD;  Location: [facility] ENDOSCOPY;  Service: Endoscopy;  Laterality: N/A;   HERNIA REPAIR     INSERTION OF MESH  08/29/2021   Procedure: INSERTION OF MESH;  Surgeon: Jules Husbands, MD;  Location: [facility] ORS;  Service: General;;   LEG SURGERY Right 10/08/2005   broken right lower leg; plate and screws   PATELLA FRACTURE SURGERY Right    TUBAL LIGATION     XI ROBOTIC ASSISTED HIATAL HERNIA REPAIR  N/A 08/29/2021   Procedure: XI ROBOTIC Hood, RNFA to assist;  Surgeon: Jules Husbands, MD;  Location: [facility] ORS;  Service: General;  Laterality: N/A;   Social History   Tobacco Use   Smoking status: Former    Packs/day: .15    Types: Cigarettes    Quit date: 04/09/2021    Years since quitting: 1.7   Smokeless tobacco: Former    Quit date: 1973   Tobacco comments:    Smoking 2 cigarettes/day    Patient dipped snuff and used smokeless tobacco when she was very young. Patient stopped at age 74.    Pack last 3 weeks        Quit a month ago. Feels good with quitting.   Vaping Use   Vaping Use: Never used  Substance Use Topics   Alcohol use: No   Drug use: No   Family History  Problem Relation Age of Onset   Cancer Mother    Heart attack Father    Cancer Brother        colon   Heart attack Brother 12   Diabetes Daughter    Diabetes Son    Hypertension Maternal Aunt    Breast cancer Neg Hx    Allergies  Allergen Reactions   Celery Oil     itching   Demerol [Meperidine]     Burning, feels hot   Morphine And Related Other (See Comments)    Muscle Spasms   Penicillins     blisters      Review of Systems  Constitutional:  Positive for chills and malaise/fatigue.  HENT:  Positive for ear pain (left ear), sore throat and tinnitus.   Eyes: Negative.   Respiratory:  Positive for cough, sputum production (yellow), shortness of breath and wheezing.   Cardiovascular:  Positive for chest pain (complains of cest tightness when she coughs).  Genitourinary: Negative.   Musculoskeletal:  Positive for back pain (when she coughs).  Skin: Negative.   Neurological:  Positive for dizziness and headaches.  Psychiatric/Behavioral: Negative.        Objective:     BP 105/67 (BP Location: Right Arm, Patient Position: Sitting, Cuff Size: Normal)   Pulse 86   Temp 98.1 F (36.7 C) (Oral)   Resp (!) 24   Ht 5' 1.5" (1.562 m)   Wt 144 lb 1.6 oz (65.4 kg)    SpO2 (!) 89%   BMI 26.79 kg/m  BP Readings from Last 3 Encounters:  01/03/23 105/67  11/08/22 124/66  10/18/22 125/73   Wt Readings from Last 3 Encounters:  01/03/23 144 lb 1.6 oz (65.4 kg)  11/08/22 150 lb 3.2 oz (68.1 kg)  10/18/22 151 lb 8 oz (68.7 kg)   SpO2 Readings from Last 3 Encounters:  01/03/23 (!) 89%  11/08/22 93%  10/11/22 94%      Physical Exam Constitutional:      Appearance:  Normal appearance. She is normal weight.  HENT:     Head: Normocephalic.     Right Ear: Tympanic membrane, ear canal and external ear normal. Tympanic membrane is not erythematous.     Left Ear: Hearing normal. No drainage or swelling. A middle ear effusion is present. There is mastoid tenderness. Tympanic membrane is erythematous.     Nose: Rhinorrhea present.     Mouth/Throat:     Mouth: Mucous membranes are moist.     Pharynx: Oropharynx is clear.  Eyes:     Extraocular Movements: Extraocular movements intact.     Conjunctiva/sclera: Conjunctivae normal.     Pupils: Pupils are equal, round, and reactive to light.  Cardiovascular:     Rate and Rhythm: Normal rate and regular rhythm.     Pulses: Normal pulses.     Heart sounds: Normal heart sounds.  Pulmonary:     Effort: Pulmonary effort is normal.     Breath sounds: Normal breath sounds.  Musculoskeletal:     Cervical back: Normal range of motion.  Skin:    General: Skin is warm and dry.     Capillary Refill: Capillary refill takes less than 2 seconds.  Neurological:     General: No focal deficit present.     Mental Status: She is alert and oriented to person, place, and time. Mental status is at baseline.  Psychiatric:        Mood and Affect: Mood normal.        Behavior: Behavior normal.        Thought Content: Thought content normal.        Judgment: Judgment normal.      No results found for any visits on 01/03/23.  Last CBC Lab Results  Component Value Date   WBC 6.8 01/23/2022   HGB 12.9 01/23/2022   HCT  41.2 01/23/2022   MCV 93.0 01/23/2022   MCH 29.1 01/23/2022   RDW 12.7 01/23/2022   PLT 244 [id number]   Last metabolic panel Lab Results  Component Value Date   GLUCOSE 205 (H) 04/05/2022   NA 138 04/05/2022   K 4.1 04/05/2022   CL 102 04/05/2022   CO2 24 04/05/2022   BUN 7 (L) 04/05/2022   CREATININE 0.76 04/05/2022   EGFR 88 04/05/2022   CALCIUM 9.5 04/05/2022   PROT 6.7 04/05/2022   ALBUMIN 4.0 04/05/2022   LABGLOB 2.7 04/05/2022   AGRATIO 1.5 04/05/2022   BILITOT <0.2 04/05/2022   ALKPHOS 95 04/05/2022   AST 33 04/05/2022   ALT 25 04/05/2022   ANIONGAP 9 01/23/2022   Last lipids Lab Results  Component Value Date   CHOL 210 (H) 03/15/2022   HDL 63 03/15/2022   LDLCALC 126 (H) 03/15/2022   TRIG 118 03/15/2022   CHOLHDL 3.3 03/15/2022   Last hemoglobin A1c Lab Results  Component Value Date   HGBA1C 6.5 (A) 10/11/2022   Last thyroid functions Lab Results  Component Value Date   TSH 1.890 03/15/2022   T4TOTAL 7.5 07/28/2020   Last vitamin D No results found for: "25OHVITD2", "25OHVITD3", "VD25OH" Last vitamin B12 and Folate No results found for: "VITAMINB12", "FOLATE"    The 10-year ASCVD risk score (Arnett DK, et al., 2019) is: 8.3%    Assessment & Plan:  1. Acute cough - Ddx CAP, Covid, Unable to perform chest X-ray in office thus will treat patient prophylactically with Azithromycin, Prednisone, and Tessalon perles due to her comorbidity. Patient has a history of Asthma and  COPD. Patient will call the clinic or go to the ED is symptoms get worse or do not improve. She was advised to perform Covid home test. - benzonatate (TESSALON PERLES) 100 MG capsule; Take 1 capsule (100 mg total) by mouth 3 (three) times daily as needed.  Dispense: 20 capsule; Refill: 0  2. Acute otitis media, unspecified otitis media type -She has left mid ear effusion, mastoid pain, small amount of cerumen, she will be started on Azithromycin. Patient educated to complete  antibiotics as prescribed and it's side effects and was advised to go to the ED and Call the office if symptoms do not improve or worsen.  - azithromycin (ZITHROMAX) 250 MG tablet; Take 500 mg day 1 and 250 mg  daily x 4 days  Dispense: 6 each; Refill: 0 - predniSONE (DELTASONE) 10 MG tablet; Take 1 tablet (10 mg total) by mouth daily with breakfast.  Dispense: 5 tablet; Refill: 0    Return in about 2 weeks (around 01/17/2023), or if symptoms worsen or fail to improve.    [place] Jerold Coombe, NP

## 2023-01-03 NOTE — Patient Instructions (Addendum)
DASH Eating Plan DASH stands for Dietary Approaches to Stop Hypertension. The DASH eating plan is a healthy eating plan that has been shown to: Reduce high blood pressure (hypertension). Reduce your risk for type 2 diabetes, heart disease, and stroke. Help with weight loss. What are tips for following this plan? Reading food labels Check food labels for the amount of salt (sodium) per serving. Choose foods with less than 5 percent of the Daily Value of sodium. Generally, foods with less than 300 milligrams (mg) of sodium per serving fit into this eating plan. To find whole grains, look for the word "whole" as the first word in the ingredient list. Shopping Buy products labeled as "low-sodium" or "no salt added." Buy fresh foods. Avoid canned foods and pre-made or frozen meals. Cooking Avoid adding salt when cooking. Use salt-free seasonings or herbs instead of table salt or sea salt. Check with your health care provider or pharmacist before using salt substitutes. Do not fry foods. Cook foods using healthy methods such as baking, boiling, grilling, roasting, and broiling instead. Cook with heart-healthy oils, such as olive, canola, avocado, soybean, or sunflower oil. Meal planning  Eat a balanced diet that includes: 4 or more servings of fruits and 4 or more servings of vegetables each day. Try to fill one-half of your plate with fruits and vegetables. 6-8 servings of whole grains each day. Less than 6 oz (170 g) of lean meat, poultry, or fish each day. A 3-oz (85-g) serving of meat is about the same size as a deck of cards. One egg equals 1 oz (28 g). 2-3 servings of low-fat dairy each day. One serving is 1 cup (237 mL). 1 serving of nuts, seeds, or beans 5 times each week. 2-3 servings of heart-healthy fats. Healthy fats called omega-3 fatty acids are found in foods such as walnuts, flaxseeds, fortified milks, and eggs. These fats are also found in cold-water fish, such as sardines, salmon,  and mackerel. Limit how much you eat of: Canned or prepackaged foods. Food that is high in trans fat, such as some fried foods. Food that is high in saturated fat, such as fatty meat. Desserts and other sweets, sugary drinks, and other foods with added sugar. Full-fat dairy products. Do not salt foods before eating. Do not eat more than 4 egg yolks a week. Try to eat at least 2 vegetarian meals a week. Eat more home-cooked food and less restaurant, buffet, and fast food. Lifestyle When eating at a restaurant, ask that your food be prepared with less salt or no salt, if possible. If you drink alcohol: Limit how much you use to: 0-1 drink a day for women who are not pregnant. 0-2 drinks a day for men. Be aware of how much alcohol is in your drink. In the [place] one drink equals one 12 oz bottle of beer (355 mL), one 5 oz glass of wine (148 mL), or one 1 oz glass of hard liquor (44 mL). General information Avoid eating more than 2,300 mg of salt a day. If you have hypertension, you may need to reduce your sodium intake to 1,500 mg a day. Work with your health care provider to maintain a healthy body weight or to lose weight. Ask what an ideal weight is for you. Get at least 30 minutes of exercise that causes your heart to beat faster (aerobic exercise) most days of the week. Activities may include walking, swimming, or biking. Work with your health care provider or dietitian to   adjust your eating plan to your individual calorie needs. What foods should I eat? Fruits All fresh, dried, or frozen fruit. Canned fruit in natural juice (without added sugar). Vegetables Fresh or frozen vegetables (raw, steamed, roasted, or grilled). Low-sodium or reduced-sodium tomato and vegetable juice. Low-sodium or reduced-sodium tomato sauce and tomato paste. Low-sodium or reduced-sodium canned vegetables. Grains Whole-grain or whole-wheat bread. Whole-grain or whole-wheat pasta. Brown rice. Oatmeal. Quinoa.  Bulgur. Whole-grain and low-sodium cereals. Pita bread. Low-fat, low-sodium crackers. Whole-wheat flour tortillas. Meats and other proteins Skinless chicken or turkey. Ground chicken or turkey. Pork with fat trimmed off. Fish and seafood. Egg whites. Dried beans, peas, or lentils. Unsalted nuts, nut butters, and seeds. Unsalted canned beans. Lean cuts of beef with fat trimmed off. Low-sodium, lean precooked or cured meat, such as sausages or meat loaves. Dairy Low-fat (1%) or fat-free (skim) milk. Reduced-fat, low-fat, or fat-free cheeses. Nonfat, low-sodium ricotta or cottage cheese. Low-fat or nonfat yogurt. Low-fat, low-sodium cheese. Fats and oils Soft margarine without trans fats. Vegetable oil. Reduced-fat, low-fat, or light mayonnaise and salad dressings (reduced-sodium). Canola, safflower, olive, avocado, soybean, and sunflower oils. Avocado. Seasonings and condiments Herbs. Spices. Seasoning mixes without salt. Other foods Unsalted popcorn and pretzels. Fat-free sweets. The items listed above may not be a complete list of foods and beverages you can eat. Contact a dietitian for more information. What foods should I avoid? Fruits Canned fruit in a light or heavy syrup. Fried fruit. Fruit in cream or butter sauce. Vegetables Creamed or fried vegetables. Vegetables in a cheese sauce. Regular canned vegetables (not low-sodium or reduced-sodium). Regular canned tomato sauce and paste (not low-sodium or reduced-sodium). Regular tomato and vegetable juice (not low-sodium or reduced-sodium). Pickles. Olives. Grains Baked goods made with fat, such as croissants, muffins, or some breads. Dry pasta or rice meal packs. Meats and other proteins Fatty cuts of meat. Ribs. Fried meat. Bacon. Bologna, salami, and other precooked or cured meats, such as sausages or meat loaves. Fat from the back of a pig (fatback). Bratwurst. Salted nuts and seeds. Canned beans with added salt. Canned or smoked fish.  Whole eggs or egg yolks. Chicken or turkey with skin. Dairy Whole or 2% milk, cream, and half-and-half. Whole or full-fat cream cheese. Whole-fat or sweetened yogurt. Full-fat cheese. Nondairy creamers. Whipped toppings. Processed cheese and cheese spreads. Fats and oils Butter. Stick margarine. Lard. Shortening. Ghee. Bacon fat. Tropical oils, such as coconut, palm kernel, or palm oil. Seasonings and condiments Onion salt, garlic salt, seasoned salt, table salt, and sea salt. Worcestershire sauce. Tartar sauce. Barbecue sauce. Teriyaki sauce. Soy sauce, including reduced-sodium. Steak sauce. Canned and packaged gravies. Fish sauce. Oyster sauce. Cocktail sauce. Store-bought horseradish. Ketchup. Mustard. Meat flavorings and tenderizers. Bouillon cubes. Hot sauces. Pre-made or packaged marinades. Pre-made or packaged taco seasonings. Relishes. Regular salad dressings. Other foods Salted popcorn and pretzels. The items listed above may not be a complete list of foods and beverages you should avoid. Contact a dietitian for more information. Where to find more information National Heart, Lung, and Blood Institute: www.nhlbi.nih.gov American Heart Association: www.heart.org Academy of Nutrition and Dietetics: www.eatright.org National Kidney Foundation: www.kidney.org Summary The DASH eating plan is a healthy eating plan that has been shown to reduce high blood pressure (hypertension). It may also reduce your risk for type 2 diabetes, heart disease, and stroke. When on the DASH eating plan, aim to eat more fresh fruits and vegetables, whole grains, lean proteins, low-fat dairy, and heart-healthy fats. With the DASH   eating plan, you should limit salt (sodium) intake to 2,300 mg a day. If you have hypertension, you may need to reduce your sodium intake to 1,500 mg a day. Work with your health care provider or dietitian to adjust your eating plan to your individual calorie needs. This information is not  intended to replace advice given to you by your health care provider. Make sure you discuss any questions you have with your health care provider. Document Revised: 08/28/2019 Document Reviewed: 08/28/2019 Elsevier Patient Education  [place]. Cough, Adult Coughing is a reflex that clears your throat and airways (respiratory system). It helps heal and protect your lungs. It is normal to cough from time to time. A cough that happens with other symptoms or that lasts a long time may be a sign of a condition that needs treatment. A short-term (acute) cough may only last 2-3 weeks. A long-term (chronic) cough may last 8 or more weeks. Coughing is often caused by: Diseases, such as: An infection of the respiratory system. Asthma or other heart or lung diseases. Gastroesophageal reflux. This is when acid comes back up from the stomach. Breathing in things that irritate your lungs. Allergies. Postnasal drip. This is when mucus runs down the back of your throat. Smoking. Some medicines. Follow these instructions at home: Medicines Take over-the-counter and prescription medicines only as told by your health care provider. Talk with your provider before you take cough medicine (cough suppressants). Eating and drinking Do not drink alcohol. Avoid caffeine. Drink enough fluid to keep your pee (urine) pale yellow. Lifestyle Avoid cigarette smoke. Do not use any products that contain nicotine or tobacco. These products include cigarettes, chewing tobacco, and vaping devices, such as e-cigarettes. If you need help quitting, ask your provider. Avoid things that make you cough. These may include perfumes, candles, cleaning products, or campfire smoke. General instructions  Watch for any changes to your cough. Tell your provider about them. Always cover your mouth when you cough. If the air is dry in your bedroom or home, use a cool mist vaporizer or humidifier. If your cough is worse at  night, try to sleep in a semi-upright position. Rest as needed. Contact a health care provider if: You have new symptoms, or your symptoms get worse. You cough up pus. You have a fever that does not go away or a cough that does not get better after 2-3 weeks. You cannot control your cough with medicine, and you are losing sleep. You have pain that gets worse or is not helped with medicine. You lose weight for no clear reason. You have night sweats. Get help right away if: You cough up blood. You have trouble breathing. Your heart is beating very fast. These symptoms may be an emergency. Get help right away. Call 911. Do not wait to see if the symptoms will go away. Do not drive yourself to the hospital. This information is not intended to replace advice given to you by your health care provider. Make sure you discuss any questions you have with your health care provider. Document Revised: 05/25/2022 Document Reviewed: 05/25/2022 Elsevier Patient Education  [place].

## 2023-01-17 ENCOUNTER — Ambulatory Visit: Payer: Self-pay | Admitting: Gerontology

## 2023-01-22 ENCOUNTER — Ambulatory Visit: Payer: Self-pay | Admitting: Gerontology

## 2023-01-22 ENCOUNTER — Encounter: Payer: Self-pay | Admitting: Gerontology

## 2023-01-22 ENCOUNTER — Other Ambulatory Visit: Payer: Self-pay

## 2023-01-22 VITALS — BP 131/79 | HR 83 | Temp 98.9°F | Resp 16 | Ht 61.5 in | Wt 152.1 lb

## 2023-01-22 DIAGNOSIS — H6122 Impacted cerumen, left ear: Secondary | ICD-10-CM

## 2023-01-22 DIAGNOSIS — H6690 Otitis media, unspecified, unspecified ear: Secondary | ICD-10-CM

## 2023-01-22 DIAGNOSIS — J4489 Other specified chronic obstructive pulmonary disease: Secondary | ICD-10-CM

## 2023-01-22 DIAGNOSIS — H6120 Impacted cerumen, unspecified ear: Secondary | ICD-10-CM | POA: Insufficient documentation

## 2023-01-22 DIAGNOSIS — F339 Major depressive disorder, recurrent, unspecified: Secondary | ICD-10-CM

## 2023-01-22 DIAGNOSIS — M109 Gout, unspecified: Secondary | ICD-10-CM

## 2023-01-22 DIAGNOSIS — F411 Generalized anxiety disorder: Secondary | ICD-10-CM

## 2023-01-22 DIAGNOSIS — E1140 Type 2 diabetes mellitus with diabetic neuropathy, unspecified: Secondary | ICD-10-CM

## 2023-01-22 DIAGNOSIS — R051 Acute cough: Secondary | ICD-10-CM

## 2023-01-22 MED ORDER — DEBROX 6.5 % OT SOLN
5.0000 [drp] | Freq: Two times a day (BID) | OTIC | 0 refills | Status: DC
Start: 2023-01-22 — End: 2023-03-14
  Filled 2023-01-22: qty 15, 30d supply, fill #0

## 2023-01-22 MED ORDER — GABAPENTIN 400 MG PO CAPS
400.0000 mg | ORAL_CAPSULE | Freq: Three times a day (TID) | ORAL | 2 refills | Status: AC
  Filled 2023-01-22: qty 90, 30d supply, fill #0

## 2023-01-22 MED ORDER — BENZONATATE 100 MG PO CAPS
100.0000 mg | ORAL_CAPSULE | Freq: Three times a day (TID) | ORAL | 0 refills | Status: DC | PRN
Start: 2023-01-22 — End: 2023-02-07
  Filled 2023-01-22: qty 20, 7d supply, fill #0

## 2023-01-22 MED ORDER — FLUTICASONE-SALMETEROL 250-50 MCG/ACT IN AEPB
1.0000 | INHALATION_SPRAY | Freq: Two times a day (BID) | RESPIRATORY_TRACT | 0 refills | Status: DC
Start: 2023-01-22 — End: 2023-04-25
  Filled 2023-01-22: qty 60, 30d supply, fill #0

## 2023-01-22 MED ORDER — ALLOPURINOL 100 MG PO TABS
100.0000 mg | ORAL_TABLET | Freq: Every day | ORAL | 1 refills | Status: DC
Start: 2023-01-22 — End: 2023-08-12
  Filled 2023-01-22: qty 90, 90d supply, fill #0
  Filled 2023-05-02: qty 90, 90d supply, fill #1

## 2023-01-22 NOTE — Progress Notes (Signed)
Established Patient Office Visit  Subjective   Patient ID: Amy Knapp, female    DOB: Feb 12, 1956  Age: 67 y.o. MRN: [id number]  Chief Complaint  Patient presents with   Follow-up    Patient here for 2 week follow up of cough and ear infection.    HPI Amy Knapp is a 67 y/o female with a history of Allergy, Anxiety, Arthritis, Asthma, COPD, Depression, Diabetes, who presents for 2 week follow-up of her cough and ear infection. She finished the course of steroids, antibiotics, and tessalon perles as prescribed. She denies fever, but reports continuing symptoms of productive cough with light to medium yellow phlegm and a feeling of fullness with some tenderness to the touch at her left ear.  Overall she feels her symptoms are improving, "but still there." She has stopped smoking, but is still around a lot of second hand smoke at work. She has been using rescue inhaler daily "as prevention for allergies" but has not had her daily preventative inhaler "in a while."   PHQ 9 =15 and GAD 7 = 19 today. Overall, she feels her medications are working well for her symptoms, but is dealing with acute symptoms due to the loss of 2 close family members. She denies SI/HI. She declines connection to [facility] at this time.   She reports intermittent left shoulder/arm pain with numbness in her hands, some jaw pain, and nausea that occurred on/off for 3 days over the weekend. She did not want to go to the ER at the time. She feels these symptoms resolved completely yesterday and are not present at the visit.  She is due for eye exam, low dose Chest CT for lung cancer screening. She is scheduled for her annual follow up 5/1. She feels well today and offers no complaints.  Refills of trazodone, gabapentin, daily inhaler for COPD, albuterol inhaler  Review of Systems  Constitutional:  Negative for chills, fever and malaise/fatigue.  HENT:  Positive for ear pain (Left ear "tender to touch,  fullness").   Respiratory:  Positive for cough and sputum production. Negative for shortness of breath and wheezing.   Cardiovascular:  Negative for chest pain and palpitations.  Psychiatric/Behavioral:  Positive for depression. Negative for suicidal ideas. The patient is nervous/anxious and has insomnia (When on steroids for acute URI).       Objective:     BP 131/79 (BP Location: Left Arm, Patient Position: Sitting, Cuff Size: Normal)   Pulse 83   Temp 98.9 F (37.2 C) (Oral)   Resp 16   Ht 5' 1.5" (1.562 m)   Wt 152 lb 1.6 oz (69 kg)   SpO2 91%   BMI 28.27 kg/m  BP Readings from Last 3 Encounters:  01/22/23 131/79  01/03/23 105/67  11/08/22 124/66   Wt Readings from Last 3 Encounters:  01/22/23 152 lb 1.6 oz (69 kg)  01/03/23 144 lb 1.6 oz (65.4 kg)  11/08/22 150 lb 3.2 oz (68.1 kg)      Physical Exam Vitals and nursing note reviewed.  Constitutional:      Appearance: Normal appearance.  HENT:     Left Ear: There is impacted cerumen. No mastoid tenderness.     Ears:     Comments: Serous otitis media bilaterally    Nose: Congestion present. No rhinorrhea.  Cardiovascular:     Rate and Rhythm: Normal rate and regular rhythm.     Pulses: Normal pulses.     Heart sounds: Normal heart sounds.  No murmur heard.    Comments: Chest/left arm pain from weekend not reproducible at visit.  Pulmonary:     Effort: Pulmonary effort is normal. No respiratory distress.     Breath sounds: No stridor. No wheezing, rhonchi or rales.     Comments: Reduced breath sounds at base bilaterally Chest:     Chest wall: No tenderness.  Musculoskeletal:        General: Normal range of motion.  Skin:    General: Skin is warm and dry.     Capillary Refill: Capillary refill takes less than 2 seconds.  Neurological:     General: No focal deficit present.     Mental Status: She is alert and oriented to person, place, and time.  Psychiatric:     Comments: Pt at her normal mood/behavior  baseline      No results found for any visits on 01/22/23.  Last CBC Lab Results  Component Value Date   WBC 6.8 01/23/2022   HGB 12.9 01/23/2022   HCT 41.2 01/23/2022   MCV 93.0 01/23/2022   MCH 29.1 01/23/2022   RDW 12.7 01/23/2022   PLT 244 01/23/2022   Last metabolic panel Lab Results  Component Value Date   GLUCOSE 205 (H) 04/05/2022   NA 138 04/05/2022   K 4.1 04/05/2022   CL 102 04/05/2022   CO2 24 04/05/2022   BUN 7 (L) 04/05/2022   CREATININE 0.76 04/05/2022   EGFR 88 04/05/2022   CALCIUM 9.5 04/05/2022   PROT 6.7 04/05/2022   ALBUMIN 4.0 04/05/2022   LABGLOB 2.7 04/05/2022   AGRATIO 1.5 04/05/2022   BILITOT <0.2 04/05/2022   ALKPHOS 95 04/05/2022   AST 33 04/05/2022   ALT 25 04/05/2022   ANIONGAP 9 01/23/2022   Last lipids Lab Results  Component Value Date   CHOL 210 (H) 03/15/2022   HDL 63 03/15/2022   LDLCALC 126 (H) 03/15/2022   TRIG 118 03/15/2022   CHOLHDL 3.3 03/15/2022   Last hemoglobin A1c Lab Results  Component Value Date   HGBA1C 6.5 (A) 10/11/2022   Last thyroid functions Lab Results  Component Value Date   TSH 1.890 03/15/2022   T4TOTAL 7.5 07/28/2020   Last vitamin D No results found for: "25OHVITD2", "25OHVITD3", "VD25OH"    The 10-year ASCVD risk score (Arnett DK, et al., 2019) is: 12.7%    Assessment & Plan:   1. Acute cough -  She will continue on Tessalon Perles for cough, advised cough can take weeks longer to resolve. Encouraged aversion of 2nd hand smoke exposure to the best of her abilities, increased fluids. - will refill tessalon perles for cough management. - advised to contact the clinic if her symptoms worsen.  - benzonatate (TESSALON PERLES) 100 MG capsule; Take 1 capsule (100 mg total) by mouth 3 (three) times daily as needed.  Dispense: 20 capsule; Refill: 0  2. Acute otitis media, unspecified otitis media type - infection has resolved. Advised serous otitis media can take up to 6 weeks to resolve.  -  Advised she contact clinic if her symptoms return, worsen.  3. Generalized anxiety disorder - Will have her follow with monthly medication management appointments.  - offered/advised [id number] for acute exacerbations, connections to community resources if desired.  4. Depression, recurrent - acute grief episode with recent family losses. Cymbalta and trazodone working well for her chronic symptoms. - Will have her follow with monthly medication management appointments. - offered/advised [id number] for acute exacerbations, connections to community resources  if desired.   5. Gout of multiple sites, unspecified cause, unspecified chronicity - no recent flares. Will refill.  - allopurinol (ZYLOPRIM) 100 MG tablet; Take 1 tablet (100 mg total) by mouth daily.  Dispense: 90 tablet; Refill: 1  6. Type 2 diabetes mellitus with diabetic neuropathy, without long-term current use of insulin - neuropathy symptoms well controlled by current dose. Advised continued low carb/low concentrated sweet diet. - Check A1C at next visit. - gabapentin (NEURONTIN) 400 MG capsule; Take 1 capsule (400 mg total) by mouth 3 (three) times daily.  Dispense: 90 capsule; Refill: 2  7. Chronic obstructive asthma - instructed to use daily inhaler to prevent COPD exacerbations, use the rescue albuterol inhaler for wheezing, SOB.  - fluticasone-salmeterol (WIXELA INHUB) 250-50 MCG/ACT AEPB; Inhale 1 puff into the lungs in the morning and at bedtime.  Dispense: 60 each; Refill: 0 - CT CHEST LUNG CA SCREEN LOW DOSE W/O CM; Future  8. Impacted cerumen of left ear - will try to flush out at visit today. Advised she rinse her ear in the shower, use debrox at home. Will recheck 02/06/23. - carbamide peroxide (DEBROX) 6.5 % OTIC solution; Place 5 drops into the left ear 2 (two) times daily.  Dispense: 15 mL; Refill: 0  9. Chest pain - asymptomatic at visit today. Reinforced signs/symptoms of stroke, heart attack as well as her overall  risk level with her health conditions. - STRONGLY advised to contact 911 if/when she experiences these symptoms again.    Return if symptoms worsen or fail to improve. Keep future appointments as scheduled.    Micael Hampshire, RN

## 2023-01-31 ENCOUNTER — Other Ambulatory Visit: Payer: Self-pay

## 2023-01-31 DIAGNOSIS — Z0000 Encounter for general adult medical examination without abnormal findings: Secondary | ICD-10-CM

## 2023-02-01 LAB — COMPREHENSIVE METABOLIC PANEL
ALT: 15 IU/L (ref 0–32)
AST: 18 IU/L (ref 0–40)
Albumin/Globulin Ratio: 1.8 (ref 1.2–2.2)
Albumin: 4.4 g/dL (ref 3.9–4.9)
Alkaline Phosphatase: 99 IU/L (ref 44–121)
BUN/Creatinine Ratio: 22 (ref 12–28)
BUN: 15 mg/dL (ref 8–27)
Bilirubin Total: 0.2 mg/dL (ref 0.0–1.2)
CO2: 21 mmol/L (ref 20–29)
Calcium: 9.9 mg/dL (ref 8.7–10.3)
Chloride: 103 mmol/L (ref 96–106)
Creatinine, Ser: 0.68 mg/dL (ref 0.57–1.00)
Globulin, Total: 2.5 g/dL (ref 1.5–4.5)
Glucose: 159 mg/dL — ABNORMAL HIGH (ref 70–99)
Potassium: 5.1 mmol/L (ref 3.5–5.2)
Sodium: 143 mmol/L (ref 134–144)
Total Protein: 6.9 g/dL (ref 6.0–8.5)
eGFR: 97 mL/min/{1.73_m2} (ref >59)

## 2023-02-01 LAB — CBC WITH DIFFERENTIAL/PLATELET
Basophils Absolute: 0 10*3/uL (ref 0.0–0.2)
Basos: 0 %
EOS (ABSOLUTE): 0.1 10*3/uL (ref 0.0–0.4)
Eos: 1 %
Hematocrit: 41.3 % (ref 34.0–46.6)
Hemoglobin: 13.7 g/dL (ref 11.1–15.9)
Immature Grans (Abs): 0 10*3/uL (ref 0.0–0.1)
Immature Granulocytes: 0 %
Lymphocytes Absolute: 1.5 10*3/uL (ref 0.7–3.1)
Lymphs: 19 %
MCH: 30.2 pg (ref 26.6–33.0)
MCHC: 33.2 g/dL (ref 31.5–35.7)
MCV: 91 fL (ref 79–97)
Monocytes Absolute: 0.5 10*3/uL (ref 0.1–0.9)
Monocytes: 6 %
Neutrophils Absolute: 6 10*3/uL (ref 1.4–7.0)
Neutrophils: 74 %
Platelets: 259 10*3/uL (ref 150–450)
RBC: 4.53 x10E6/uL (ref 3.77–5.28)
RDW: 12 % (ref 11.7–15.4)
WBC: 8.2 10*3/uL (ref 3.4–10.8)

## 2023-02-01 LAB — LIPID PANEL
Chol/HDL Ratio: 2.7 ratio (ref 0.0–4.4)
Cholesterol, Total: 194 mg/dL (ref 100–199)
HDL: 72 mg/dL (ref >39)
LDL Chol Calc (NIH): 88 mg/dL (ref 0–99)
Triglycerides: 208 mg/dL — ABNORMAL HIGH (ref 0–149)
VLDL Cholesterol Cal: 34 mg/dL (ref 5–40)

## 2023-02-01 LAB — HEMOGLOBIN A1C
Est. average glucose Bld gHb Est-mCnc: 151 mg/dL
Hgb A1c MFr Bld: 6.9 % — ABNORMAL HIGH (ref 4.8–5.6)

## 2023-02-06 ENCOUNTER — Ambulatory Visit: Payer: Self-pay | Admitting: Gerontology

## 2023-02-07 ENCOUNTER — Ambulatory Visit: Payer: Medicaid Other | Admitting: Gerontology

## 2023-02-07 ENCOUNTER — Other Ambulatory Visit: Payer: Self-pay

## 2023-02-07 VITALS — BP 128/77 | HR 71 | Wt 154.4 lb

## 2023-02-07 DIAGNOSIS — E039 Hypothyroidism, unspecified: Secondary | ICD-10-CM

## 2023-02-07 DIAGNOSIS — E119 Type 2 diabetes mellitus without complications: Secondary | ICD-10-CM

## 2023-02-07 DIAGNOSIS — J4489 Other specified chronic obstructive pulmonary disease: Secondary | ICD-10-CM

## 2023-02-07 DIAGNOSIS — Z789 Other specified health status: Secondary | ICD-10-CM | POA: Insufficient documentation

## 2023-02-07 MED ORDER — LEVOTHYROXINE SODIUM 100 MCG PO TABS
ORAL_TABLET | ORAL | 1 refills | Status: DC
Start: 2023-02-07 — End: 2023-08-08
  Filled 2023-02-07: qty 90, 90d supply, fill #0
  Filled 2023-06-11: qty 90, 90d supply, fill #1

## 2023-02-07 MED ORDER — MONTELUKAST SODIUM 10 MG PO TABS
10.0000 mg | ORAL_TABLET | Freq: Every day | ORAL | 1 refills | Status: DC
Start: 2023-02-07 — End: 2023-08-12
  Filled 2023-02-07: qty 90, 90d supply, fill #0
  Filled 2023-05-02: qty 90, 90d supply, fill #1

## 2023-02-07 NOTE — Patient Instructions (Signed)

## 2023-02-07 NOTE — Progress Notes (Signed)
Established Patient Office Visit  Subjective   Patient ID: Amy Knapp, female    DOB: [birth year]  Age: 67 y.o. MRN: [id number]  No chief complaint on file.   HPI  Amy Knapp is a 67 y/o female with a history of Allergy, Anxiety, Arthritis, Asthma, COPD, Depression, Diabetes, who presents for follow-up, lab review and refill of her medications. Her labs done on 01/31/23 was unremarkable except her HgbA1c increased from 6.5% to 6.9%. She stated that she gets tired and feels sleepy a lot  for the past one month despite that she gets a enough sleep at night. She stated that she is compliant with her medications, makes healthy lifestyle modifications. She said she did not check her blood glucose this morning as a result of not having a monitor. She can't remember where she placed  glucometer. Her  random blood glucose check today was 103 mg/dl. She complains of  intermittent numbness in her bilateral lower extremities.  She denies polydipsia, polyphagia and admits  polyuria which has been the same for many years. She is not currently on any hypoglycemics mediatation. She stated that she is doing well overall and admits no more concerns but shouted  and said " what borders me right now is  that I do not have a place to stay, I sleep in the car".  PHQ 9 =8 She denies SI/HI. Mood good.  Review of Systems  Constitutional: Negative.   HENT: Negative.    Eyes: Negative.   Respiratory: Negative.    Cardiovascular: Negative.   Gastrointestinal: Negative.   Genitourinary: Negative.   Musculoskeletal: Negative.   Skin: Negative.   Neurological: Negative.   Psychiatric/Behavioral: Negative.        Objective:     BP 128/77 (BP Location: Right Arm, Patient Position: Sitting, Cuff Size: Large)   Pulse 71   Wt 154 lb 6.4 oz (70 kg)   BMI 28.70 kg/m  BP Readings from Last 3 Encounters:  02/07/23 128/77  01/22/23 131/79  01/03/23 105/67   Wt Readings from Last 3  Encounters:  02/07/23 154 lb 6.4 oz (70 kg)  01/22/23 152 lb 1.6 oz (69 kg)  01/03/23 144 lb 1.6 oz (65.4 kg)      Physical Exam Constitutional:      Appearance: Normal appearance.  Cardiovascular:     Rate and Rhythm: Normal rate and regular rhythm.  Pulmonary:     Effort: Pulmonary effort is normal.     Breath sounds: Normal breath sounds.  Abdominal:     General: Bowel sounds are normal.     Palpations: Abdomen is soft.  Musculoskeletal:        General: Normal range of motion.  Skin:    General: Skin is warm and dry.  Neurological:     Mental Status: She is alert.  Psychiatric:        Mood and Affect: Mood normal.      No results found for any visits on 02/07/23.  Last CBC Lab Results  Component Value Date   WBC 8.2 01/31/2023   HGB 13.7 01/31/2023   HCT 41.3 01/31/2023   MCV 91 01/31/2023   MCH 30.2 01/31/2023   RDW 12.0 01/31/2023   PLT 259 01/31/2023   Last metabolic panel Lab Results  Component Value Date   GLUCOSE 159 (H) 01/31/2023   NA 143 01/31/2023   K 5.1 01/31/2023   CL 103 01/31/2023   CO2 21 01/31/2023   BUN 15 01/31/2023  CREATININE 0.68 01/31/2023   EGFR 97 01/31/2023   CALCIUM 9.9 01/31/2023   PROT 6.9 01/31/2023   ALBUMIN 4.4 01/31/2023   LABGLOB 2.5 01/31/2023   AGRATIO 1.8 01/31/2023   BILITOT 0.2 01/31/2023   ALKPHOS 99 01/31/2023   AST 18 01/31/2023   ALT 15 01/31/2023   ANIONGAP 9 01/23/2022   Last lipids Lab Results  Component Value Date   CHOL 194 01/31/2023   HDL 72 01/31/2023   LDLCALC 88 01/31/2023   TRIG 208 (H) 01/31/2023   CHOLHDL 2.7 01/31/2023   Last hemoglobin A1c Lab Results  Component Value Date   HGBA1C 6.9 (H) 01/31/2023   Last thyroid functions Lab Results  Component Value Date   TSH 1.890 03/15/2022   T4TOTAL 7.5 07/28/2020   Last vitamin D No results found for: "25OHVITD2", "25OHVITD3", "VD25OH" Last vitamin B12 and Folate No results found for: "VITAMINB12", "FOLATE"    The 10-year  ASCVD risk score (Arnett DK, et al., 2019) is: 11%    Assessment & Plan:  Type 2 diabetes mellitus with diabetic neuropathy, without long-term current use of insulin - neuropathy symptoms controlled by current dose. Advised continued low carb/low concentrated sweet diet. - Check A1C at on 4/25 was 6.9% goal < 7.0%  -Continue on gabapentin (NEURONTIN) 400 MG capsule;   Depression, recurrent - Currently homeless. Cymbalta and trazodone working well for her chronic symptoms. - Will have her follow with monthly medication management appointments. - offered/advised [id number] for acute exacerbations. -community resources  offered    F/u on 03/12/23 in the clinic    Mickie Hillier, FNP

## 2023-02-08 ENCOUNTER — Other Ambulatory Visit: Payer: Self-pay

## 2023-02-14 ENCOUNTER — Telehealth: Payer: Self-pay

## 2023-02-14 NOTE — Telephone Encounter (Signed)
Called pt to discuss pt being homeless and resources. PT gave me several places she has had applications in and I called them for pt to see how far down the wait list she was. No luck there. Also, Got info for pt that her [facility] application is being processed and she should be able to get in a place with [facility] within ina month or two. Pt stated she is sleeping in her car. Pt stated she gets food stamps so no food is needed at this time.

## 2023-03-11 ENCOUNTER — Other Ambulatory Visit: Payer: Self-pay

## 2023-03-11 ENCOUNTER — Other Ambulatory Visit: Payer: Self-pay | Admitting: Gerontology

## 2023-03-11 DIAGNOSIS — G4700 Insomnia, unspecified: Secondary | ICD-10-CM

## 2023-03-12 ENCOUNTER — Other Ambulatory Visit: Payer: Self-pay | Admitting: Gerontology

## 2023-03-12 ENCOUNTER — Other Ambulatory Visit: Payer: Self-pay

## 2023-03-12 DIAGNOSIS — G4700 Insomnia, unspecified: Secondary | ICD-10-CM

## 2023-03-13 ENCOUNTER — Other Ambulatory Visit: Payer: Self-pay

## 2023-03-13 MED FILL — Trazodone HCl Tab 50 MG: ORAL | 30 days supply | Qty: 90 | Fill #0 | Status: AC

## 2023-03-14 ENCOUNTER — Ambulatory Visit: Payer: Medicaid Other | Admitting: Gerontology

## 2023-03-14 ENCOUNTER — Other Ambulatory Visit: Payer: Self-pay

## 2023-03-14 ENCOUNTER — Encounter: Payer: Self-pay | Admitting: Gerontology

## 2023-03-14 VITALS — BP 112/70 | HR 66 | Temp 97.9°F | Resp 16 | Ht 61.5 in | Wt 156.4 lb

## 2023-03-14 DIAGNOSIS — Z7689 Persons encountering health services in other specified circumstances: Secondary | ICD-10-CM

## 2023-03-14 NOTE — Progress Notes (Signed)
Established Patient Office Visit  Subjective   Patient ID: Amy Knapp, female    DOB: May 10, 1956  Age: 67 y.o. MRN: [id number]  Chief Complaint  Patient presents with   Follow-up    Patient states she has Medicaid. Not sure if it is family planning or full Medicaid    HPI  Amy Knapp is a 67 y/o female with a history of Allergy, Anxiety, Arthritis, Asthma, COPD, Depression, Diabetes, who presents for follow up of medication management. She  states that she's compliant with her medications, denies side effects and continues to make healthy lifestyle changes. She states that she is still looking for accomodation, and sleeps in her car sometimes. She states that her mood is dysphoric because of her living situation, but denies suicidal nor homicidal ideation. Overall, she states that se's doing well and offers no further complaint.  PHQ-9 = 5 GAD-7 = 10  Review of Systems  Constitutional: Negative.   Eyes: Negative.   Respiratory: Negative.    Cardiovascular: Negative.   Neurological: Negative.   Psychiatric/Behavioral: Negative.        Objective:     BP 112/70 (BP Location: Left Arm, Patient Position: Sitting, Cuff Size: Normal)   Pulse 66   Temp 97.9 F (36.6 C) (Oral)   Resp 16   Ht 5' 1.5" (1.562 m)   Wt 156 lb 6.4 oz (70.9 kg)   SpO2 92%   BMI 29.07 kg/m  BP Readings from Last 3 Encounters:  03/14/23 112/70  02/07/23 128/77  01/22/23 131/79   Wt Readings from Last 3 Encounters:  03/14/23 156 lb 6.4 oz (70.9 kg)  02/07/23 154 lb 6.4 oz (70 kg)  01/22/23 152 lb 1.6 oz (69 kg)      Physical Exam Cardiovascular:     Rate and Rhythm: Normal rate and regular rhythm.     Pulses: Normal pulses.     Heart sounds: Normal heart sounds.  Pulmonary:     Effort: Pulmonary effort is normal.     Breath sounds: Normal breath sounds.  Neurological:     General: No focal deficit present.     Mental Status: She is alert and oriented to person,  place, and time. Mental status is at baseline.  Psychiatric:        Mood and Affect: Mood normal.        Behavior: Behavior normal.        Thought Content: Thought content normal.        Judgment: Judgment normal.      No results found for any visits on 03/14/23.  Last CBC Lab Results  Component Value Date   WBC 8.2 01/31/2023   HGB 13.7 01/31/2023   HCT 41.3 01/31/2023   MCV 91 01/31/2023   MCH 30.2 01/31/2023   RDW 12.0 01/31/2023   PLT 259 01/31/2023   Last metabolic panel Lab Results  Component Value Date   GLUCOSE 159 (H) 01/31/2023   NA 143 01/31/2023   K 5.1 01/31/2023   CL 103 01/31/2023   CO2 21 01/31/2023   BUN 15 01/31/2023   CREATININE 0.68 01/31/2023   EGFR 97 01/31/2023   CALCIUM 9.9 01/31/2023   PROT 6.9 01/31/2023   ALBUMIN 4.4 01/31/2023   LABGLOB 2.5 01/31/2023   AGRATIO 1.8 01/31/2023   BILITOT 0.2 01/31/2023   ALKPHOS 99 01/31/2023   AST 18 01/31/2023   ALT 15 01/31/2023   ANIONGAP 9 01/23/2022   Last lipids Lab Results  Component  Value Date   CHOL 194 01/31/2023   HDL 72 01/31/2023   LDLCALC 88 01/31/2023   TRIG 208 (H) 01/31/2023   CHOLHDL 2.7 01/31/2023   Last hemoglobin A1c Lab Results  Component Value Date   HGBA1C 6.9 (H) 01/31/2023   Last thyroid functions Lab Results  Component Value Date   TSH 1.890 03/15/2022   T4TOTAL 7.5 07/28/2020   Last vitamin D No results found for: "25OHVITD2", "25OHVITD3", "VD25OH"    The 10-year ASCVD risk score (Arnett DK, et al., 2019) is: 8.5%    Assessment & Plan:   1. Sleep concern - She will continue current medication, her symptoms are secondary to living situation. She was advised to call the Crisis help line for worsening symptoms.   Return in about 5 weeks (around 04/17/2023), or if symptoms worsen or fail to improve.    Santiel Topper Trellis Paganini, NP

## 2023-03-25 ENCOUNTER — Ambulatory Visit
Admission: RE | Admit: 2023-03-25 | Discharge: 2023-03-25 | Disposition: A | Discharge status: Home / Self Care | Payer: Self-pay | Source: Ambulatory Visit | Attending: Gerontology | Admitting: Gerontology

## 2023-03-25 DIAGNOSIS — J4489 Other specified chronic obstructive pulmonary disease: Secondary | ICD-10-CM | POA: Insufficient documentation

## 2023-04-17 ENCOUNTER — Ambulatory Visit: Payer: Self-pay | Admitting: Gerontology

## 2023-04-25 ENCOUNTER — Ambulatory Visit: Payer: Medicaid Other | Admitting: Gerontology

## 2023-04-25 ENCOUNTER — Other Ambulatory Visit: Payer: Self-pay

## 2023-04-25 VITALS — BP 128/82 | HR 67 | Temp 98.7°F | Wt 157.0 lb

## 2023-04-25 DIAGNOSIS — J4489 Other specified chronic obstructive pulmonary disease: Secondary | ICD-10-CM

## 2023-04-25 DIAGNOSIS — E1140 Type 2 diabetes mellitus with diabetic neuropathy, unspecified: Secondary | ICD-10-CM

## 2023-04-25 LAB — POCT GLYCOSYLATED HEMOGLOBIN (HGB A1C): Hemoglobin A1C: 6.1 % — AB (ref 4.0–5.6)

## 2023-04-25 LAB — GLUCOSE, POCT (MANUAL RESULT ENTRY): POC Glucose: 91 mg/dl (ref 70–99)

## 2023-04-25 MED ORDER — FLUTICASONE-SALMETEROL 250-50 MCG/ACT IN AEPB
1.0000 | INHALATION_SPRAY | Freq: Two times a day (BID) | RESPIRATORY_TRACT | 0 refills | Status: DC
Start: 2023-04-25 — End: 2023-09-11
  Filled 2023-04-25: qty 60, 30d supply, fill #0

## 2023-04-25 NOTE — Progress Notes (Signed)
Established Patient Office Visit  Subjective   Patient ID: Amy Knapp, female    DOB: [birth year]  Age: 67 y.o. MRN: [id number]  Chief Complaint  Patient presents with   Acute Visit    Follow up visit    HPI    Amy Knapp is a 67 y/o female with a history of Allergy, Anxiety, Arthritis, Asthma, COPD, Depression, Diabetes, who presents for follow up.  She reports that she has obtained housing and has moved in.  She reports everything is going well there and she likes it.  She reports that her mood is "ok" today, denies suicidal nor homicidal ideation.  She reports she is helping her granddaughter plan her wedding ceremony in January so she is in good spirits.  Her HgbA1c checked during visit decreased from 6.9% to 6.1% and her blood glucose was 91 mg/dl. She reports that she experienced and episode of  hypoglycemic symptoms"jittery, nervousness) at times and drinks coca cola to help bring her blood glucose back up, but she does not check her blood glucose.  She reports that she has increased the amount of water she is drinking during the hot summer days to prevent dehydration.  She reports she has been having trouble falling asleep.  She reports she is taking her medication as prescribed but does not fall asleep until hours later.  Overall, she states that she's doing well and does not report any additional complaint.    Review of Systems  Constitutional: Negative.   Eyes: Negative.   Respiratory: Negative.  Negative for cough and sputum production.   Cardiovascular: Negative.   Genitourinary: Negative.   Skin: Negative.   Neurological: Negative.   Endo/Heme/Allergies: Negative.   Psychiatric/Behavioral: Negative.        Objective:     BP 128/82 (BP Location: Right Arm, Patient Position: Sitting, Cuff Size: Normal)   Pulse 67   Temp 98.7 F (37.1 C) (Oral)   Wt 156 lb 15.5 oz (71.2 kg)   SpO2 91%   BMI 29.18 kg/m  BP Readings from Last 3  Encounters:  04/25/23 128/82  03/14/23 112/70  02/07/23 128/77   Wt Readings from Last 3 Encounters:  04/25/23 156 lb 15.5 oz (71.2 kg)  03/14/23 156 lb 6.4 oz (70.9 kg)  02/07/23 154 lb 6.4 oz (70 kg)      Physical Exam Constitutional:      Appearance: Normal appearance.  HENT:     Head: Normocephalic.  Cardiovascular:     Rate and Rhythm: Normal rate and regular rhythm.     Pulses: Normal pulses.     Heart sounds: Normal heart sounds.  Pulmonary:     Effort: Pulmonary effort is normal.     Breath sounds: Normal breath sounds.  Abdominal:     General: Bowel sounds are normal.  Skin:    General: Skin is warm.  Neurological:     General: No focal deficit present.     Mental Status: She is alert and oriented to person, place, and time. Mental status is at baseline.  Psychiatric:        Mood and Affect: Mood normal.        Behavior: Behavior normal.      Results for orders placed or performed in visit on 04/25/23  POCT Glucose (CBG)  Result Value Ref Range   POC Glucose 91 70 - 99 mg/dl  POCT HgB W0J  Result Value Ref Range   Hemoglobin A1C 6.1 (A) 4.0 -  5.6 %   HbA1c POC (<> result, manual entry)     HbA1c, POC (prediabetic range)     HbA1c, POC (controlled diabetic range)      Last CBC Lab Results  Component Value Date   WBC 8.2 01/31/2023   HGB 13.7 01/31/2023   HCT 41.3 01/31/2023   MCV 91 01/31/2023   MCH 30.2 01/31/2023   RDW 12.0 01/31/2023   PLT 259 01/31/2023   Last metabolic panel Lab Results  Component Value Date   GLUCOSE 159 (H) 01/31/2023   NA 143 01/31/2023   K 5.1 01/31/2023   CL 103 01/31/2023   CO2 21 01/31/2023   BUN 15 01/31/2023   CREATININE 0.68 01/31/2023   EGFR 97 01/31/2023   CALCIUM 9.9 01/31/2023   PROT 6.9 01/31/2023   ALBUMIN 4.4 01/31/2023   LABGLOB 2.5 01/31/2023   AGRATIO 1.8 01/31/2023   BILITOT 0.2 01/31/2023   ALKPHOS 99 01/31/2023   AST 18 01/31/2023   ALT 15 01/31/2023   ANIONGAP 9 01/23/2022   Last  lipids Lab Results  Component Value Date   CHOL 194 01/31/2023   HDL 72 01/31/2023   LDLCALC 88 01/31/2023   TRIG 208 (H) 01/31/2023   CHOLHDL 2.7 01/31/2023   Last hemoglobin A1c Lab Results  Component Value Date   HGBA1C 6.1 (A) 04/25/2023   Last thyroid functions Lab Results  Component Value Date   TSH 1.890 03/15/2022   T4TOTAL 7.5 07/28/2020   Last vitamin D No results found for: "25OHVITD2", "25OHVITD3", "VD25OH" Last vitamin B12 and Folate No results found for: "VITAMINB12", "FOLATE"    The 10-year ASCVD risk score (Arnett DK, et al., 2019) is: 11%    Assessment & Plan:  1. Type 2 diabetes mellitus with diabetic neuropathy, without long-term current use of insulin (HCC) Her blood glucose is under control.  She was encouraged to continue on current medications,  low carb/low concentrated sweet diet. - POCT HgB A1C; Future - POCT Glucose (CBG); Future - POCT Glucose (CBG) - POCT HgB A1C  2. Chronic obstructive asthma - her breathing is stable, and will continue current medication. - fluticasone-salmeterol (WIXELA INHUB) 250-50 MCG/ACT AEPB; Inhale 1 puff into the lungs in the morning and at bedtime.  Dispense: 60 each; Refill: 0   Follow up visit on 07/25/23 or if symptoms worsen or fail to improve.    Return in about 13 weeks (around 07/25/2023).    Virl Son, RN

## 2023-04-25 NOTE — Patient Instructions (Signed)

## 2023-05-02 ENCOUNTER — Other Ambulatory Visit: Payer: Self-pay

## 2023-05-02 MED FILL — Trazodone HCl Tab 50 MG: ORAL | 30 days supply | Qty: 90 | Fill #1 | Status: AC

## 2023-06-05 ENCOUNTER — Ambulatory Visit: Payer: Self-pay | Admitting: Urology

## 2023-06-05 ENCOUNTER — Other Ambulatory Visit: Payer: Self-pay

## 2023-06-11 ENCOUNTER — Other Ambulatory Visit: Payer: Self-pay

## 2023-06-11 ENCOUNTER — Other Ambulatory Visit: Payer: Self-pay | Admitting: Gerontology

## 2023-06-11 DIAGNOSIS — G4700 Insomnia, unspecified: Secondary | ICD-10-CM

## 2023-06-11 MED ORDER — TRAZODONE HCL 50 MG PO TABS
150.0000 mg | ORAL_TABLET | Freq: Every day | ORAL | 1 refills | Status: DC
  Filled 2023-06-11: qty 90, 30d supply, fill #0
  Filled 2023-07-22: qty 90, 30d supply, fill #1

## 2023-07-22 ENCOUNTER — Other Ambulatory Visit: Payer: Self-pay

## 2023-07-25 ENCOUNTER — Ambulatory Visit: Payer: Medicaid Other | Admitting: Gerontology

## 2023-07-30 ENCOUNTER — Encounter: Payer: Self-pay | Admitting: Gerontology

## 2023-07-30 ENCOUNTER — Ambulatory Visit: Payer: Medicaid Other | Admitting: Gerontology

## 2023-07-30 ENCOUNTER — Other Ambulatory Visit: Payer: Self-pay

## 2023-07-30 VITALS — BP 153/81 | HR 80 | Ht 62.0 in | Wt 148.0 lb

## 2023-07-30 DIAGNOSIS — Z0000 Encounter for general adult medical examination without abnormal findings: Secondary | ICD-10-CM

## 2023-07-30 DIAGNOSIS — E1140 Type 2 diabetes mellitus with diabetic neuropathy, unspecified: Secondary | ICD-10-CM

## 2023-07-30 DIAGNOSIS — R051 Acute cough: Secondary | ICD-10-CM

## 2023-07-30 DIAGNOSIS — M79602 Pain in left arm: Secondary | ICD-10-CM

## 2023-07-30 LAB — POCT GLYCOSYLATED HEMOGLOBIN (HGB A1C): Hemoglobin A1C: 5.9 % — AB (ref 4.0–5.6)

## 2023-07-30 LAB — GLUCOSE, POCT (MANUAL RESULT ENTRY): POC Glucose: 96 mg/dL (ref 70–99)

## 2023-07-30 MED ORDER — BENZONATATE 100 MG PO CAPS
100.0000 mg | ORAL_CAPSULE | Freq: Three times a day (TID) | ORAL | 0 refills | Status: DC | PRN
Start: 2023-07-30 — End: 2023-11-29
  Filled 2023-07-30: qty 20, 7d supply, fill #0

## 2023-07-30 NOTE — Progress Notes (Unsigned)
Established Patient Office Visit  Subjective   Patient ID: Amy Knapp, female    DOB: 1956-01-04  Age: 67 y.o. MRN: [id number]  Chief Complaint  Patient presents with  . Follow-up  . Arm Pain    Left x months    Arm Pain   Amy Knapp is a 67 y/o female with a history of Allergy, Anxiety, Arthritis, Asthma, COPD, Depression, Diabetes, who presents for follow up. Her HgbA1c checked during visit decreased from 6.1% to 5.9%. Her blood glucose checked during visit was 98 mg/dl. She reports that she experienced an episode of  hypoglycemic symptoms, shakiness, at times and drinks orange juice to help normalize her blood glucose. She reports pain in her left arm that starts from her left ring finger and goes up to her left shoulder. She reports that the pain has a dull sensation. It usually starts in the morning and stops at noon and starts again. She reports that activities makes the pain worst and resting her arm relieves the pain . She reports that the pain last about 3 to 4 hours in the day and stops on its own. She rates the pain 5/10.  She reports increased wheezing and coughing that has lasted for months. She reports deep coughs at night that sometimes disrupt her sleep. She states that it might be related to her chronic illness, COPD. She is currently on medications for wheezing but she states that "it doesn't help a whole lot".  Her PHQ-9 is 7 and her GAD-7 is 11. She complained that she no longer gets food stamps and that her car is about to break down. Overall, she states that she's doing well and does not report any additional complaint.    Patient Active Problem List   Diagnosis Date Noted  . Needs assistance with community resources 02/07/2023  . Cerumen impaction 01/22/2023  . Otitis media 01/03/2023  . Acute bronchitis with COPD (HCC) 09/19/2022  . Sinusitis 09/19/2022  . Headache 08/23/2022  . Cough 04/05/2022  . Nasal congestion with rhinorrhea  04/05/2022  . Right flank pain 03/22/2022  . Thickened nails 02/01/2022  . Left-sided chest pain 11/02/2021  . S/P repair of paraesophageal hernia 08/29/2021  . Complex renal cyst 06/24/2021  . Vision abnormalities 06/23/2021  . Sleep concern 06/22/2021  . Lump of skin of back 05/18/2021  . Hiatal hernia 04/20/2021  . Essential hypertension 04/20/2021  . Diarrhea   . Dysphagia   . Right knee pain 03/23/2021  . Acute leg pain, left 12/22/2020  . Type 2 diabetes mellitus with diabetic neuropathy, unspecified (HCC) 03/10/2020  . Nipple discharge 03/10/2020  . Flu vaccine need 11/13/2016  . Hyperlipidemia 08/14/2016  . Edema of extremities 08/14/2016  . Back pain 12/09/2014  . Hypothyroidism 11/11/2014  . COPD (chronic obstructive pulmonary disease) (HCC) 11/11/2014  . Gout 11/11/2014  . Seborrheic keratosis 10/28/2014  . Diabetes (HCC) 09/14/2014  . Sleep apnea 06/29/2014   Past Medical History:  Diagnosis Date  . Allergy   . Anxiety   . Arthritis   . Asthma   . Broken leg 2007   Right  . Cataract   . Complex renal cyst 06/24/2021  . COPD (chronic obstructive pulmonary disease) (HCC)   . Depression   . Diabetes mellitus without complication (HCC)   . Emphysema of lung (HCC)   . Essential hypertension 04/20/2021  . GERD (gastroesophageal reflux disease)   . Gout   . History of hiatal hernia   .  Hyperlipidemia   . Hypothyroidism   . Sleep apnea   . Thyroid disease    Past Surgical History:  Procedure Laterality Date  . ABDOMINAL HYSTERECTOMY  2000   Partial  . COLONOSCOPY WITH PROPOFOL N/A 04/11/2021   Procedure: COLONOSCOPY WITH PROPOFOL;  Surgeon: Midge Minium, MD;  Location: [facility] ENDOSCOPY;  Service: Endoscopy;  Laterality: N/A;  . ESOPHAGOGASTRODUODENOSCOPY N/A 04/11/2021   Procedure: ESOPHAGOGASTRODUODENOSCOPY (EGD);  Surgeon: Midge Minium, MD;  Location: [facility] ENDOSCOPY;  Service: Endoscopy;  Laterality: N/A;  . HERNIA REPAIR    . INSERTION OF MESH   08/29/2021   Procedure: INSERTION OF MESH;  Surgeon: Leafy Ro, MD;  Location: [facility] ORS;  Service: General;;  . LEG SURGERY Right 10/08/2005   broken right lower leg; plate and screws  . PATELLA FRACTURE SURGERY Right   . TUBAL LIGATION    . XI ROBOTIC ASSISTED HIATAL HERNIA REPAIR N/A 08/29/2021   Procedure: XI ROBOTIC ASSISTED HIATAL HERNIA REPAIR, RNFA to assist;  Surgeon: Leafy Ro, MD;  Location: [facility] ORS;  Service: General;  Laterality: N/A;   Social History   Tobacco Use  . Smoking status: Former    Current packs/day: 0.00    Types: Cigarettes    Quit date: 04/09/2021    Years since quitting: 2.3  . Smokeless tobacco: Former    Quit date: 1973  . Tobacco comments:    Smoking 2 cigarettes/day    Patient dipped snuff and used smokeless tobacco when she was very young. Patient stopped at age 62.    Pack last 3 weeks        Quit a month ago. Feels good with quitting.   Vaping Use  . Vaping status: Never Used  Substance Use Topics  . Alcohol use: No  . Drug use: No   Social History   Socioeconomic History  . Marital status: Divorced    Spouse name: Not on file  . Number of children: 4  . Years of education: Not on file  . Highest education level: High school graduate  Occupational History  . Occupation: Unemployed  Tobacco Use  . Smoking status: Former    Current packs/day: 0.00    Types: Cigarettes    Quit date: 04/09/2021    Years since quitting: 2.3  . Smokeless tobacco: Former    Quit date: 1973  . Tobacco comments:    Smoking 2 cigarettes/day    Patient dipped snuff and used smokeless tobacco when she was very young. Patient stopped at age 65.    Pack last 3 weeks        Quit a month ago. Feels good with quitting.   Vaping Use  . Vaping status: Never Used  Substance and Sexual Activity  . Alcohol use: No  . Drug use: No  . Sexual activity: Not Currently  Other Topics Concern  . Not on file  Social History Narrative  . Not on file   Social  Determinants of Health   Financial Resource Strain: High Risk (07/30/2023)   Overall Financial Resource Strain (CARDIA)   . Difficulty of Paying Living Expenses: Hard  Food Insecurity: Food Insecurity Present (07/30/2023)   Hunger Vital Sign   . Worried About Programme researcher, broadcasting/film/video in the Last Year: Often true   . Ran Out of Food in the Last Year: Often true  Transportation Needs: No Transportation Needs (07/30/2023)   PRAPARE - Transportation   . Lack of Transportation (Medical): No   . Lack of  Transportation (Non-Medical): No  Physical Activity: Insufficiently Active (07/30/2023)   Exercise Vital Sign   . Days of Exercise per Week: 7 days   . Minutes of Exercise per Session: 20 min  Stress: Stress Concern Present (07/30/2023)   [employer] of Occupational Health - Occupational Stress Questionnaire   . Feeling of Stress : To some extent  Social Connections: Moderately Integrated (07/30/2023)   Social Connection and Isolation Panel [NHANES]   . Frequency of Communication with Friends and Family: More than three times a week   . Frequency of Social Gatherings with Friends and Family: Once a week   . Attends Religious Services: More than 4 times per year   . Active Member of Clubs or Organizations: Yes   . Attends Banker Meetings: Never   . Marital Status: Widowed  Intimate Partner Violence: Not At Risk (07/30/2023)   Humiliation, Afraid, Rape, and Kick questionnaire   . Fear of Current or Ex-Partner: No   . Emotionally Abused: No   . Physically Abused: No   . Sexually Abused: No   Family Status  Relation Name Status  . Mother  Deceased at age 35  . Father  Deceased       20's  . Sister  (Not Specified)  . Brother  Deceased  . Daughter  Alive  . Son  Alive  . Mat Aunt  Deceased  . Neg Hx  (Not Specified)  No partnership data on file   Family History  Problem Relation Age of Onset  . Cancer Mother   . Heart attack Father   . Cancer Brother         colon  . Heart attack Brother 37  . Diabetes Daughter   . Diabetes Son   . Hypertension Maternal Aunt   . Breast cancer Neg Hx    Allergies  Allergen Reactions  . Celery Oil     itching  . Demerol [Meperidine]     Burning, feels hot  . Morphine And Codeine Other (See Comments)    Muscle Spasms  . Penicillins     blisters      Review of Systems  Constitutional: Negative.   HENT: Negative.    Eyes: Negative.   Respiratory:  Positive for cough.   Cardiovascular: Negative.   Gastrointestinal: Negative.   Genitourinary: Negative.   Musculoskeletal:  Positive for joint pain.  Skin: Negative.   Neurological: Negative.   Endo/Heme/Allergies: Negative.   Psychiatric/Behavioral: Negative.        Objective:     BP (!) 153/81   Pulse 80   Ht 5\' 2"  (1.575 m)   Wt 148 lb (67.1 kg)   SpO2 90%   BMI 27.07 kg/m  BP Readings from Last 3 Encounters:  07/30/23 (!) 153/81  04/25/23 128/82  03/14/23 112/70   Wt Readings from Last 3 Encounters:  07/30/23 148 lb (67.1 kg)  04/25/23 156 lb 15.5 oz (71.2 kg)  03/14/23 156 lb 6.4 oz (70.9 kg)      Physical Exam Constitutional:      Appearance: Normal appearance.  HENT:     Head: Normocephalic.  Eyes:     Pupils: Pupils are equal, round, and reactive to light.  Cardiovascular:     Rate and Rhythm: Normal rate and regular rhythm.  Pulmonary:     Breath sounds: Wheezing present.  Abdominal:     General: Abdomen is flat.  Musculoskeletal:        General: Tenderness present.  Cervical back: Normal range of motion.  Skin:    General: Skin is dry.  Neurological:     General: No focal deficit present.     Mental Status: She is alert and oriented to person, place, and time.  Psychiatric:        Mood and Affect: Mood normal.        Behavior: Behavior normal.        Thought Content: Thought content normal.        Judgment: Judgment normal.     Results for orders placed or performed in visit on 07/30/23  POCT  Glucose (CBG)  Result Value Ref Range   POC Glucose 96 70 - 99 mg/dl    Last CBC Lab Results  Component Value Date   WBC 8.2 01/31/2023   HGB 13.7 01/31/2023   HCT 41.3 01/31/2023   MCV 91 01/31/2023   MCH 30.2 01/31/2023   RDW 12.0 01/31/2023   PLT 259 01/31/2023   Last metabolic panel Lab Results  Component Value Date   GLUCOSE 159 (H) 01/31/2023   NA 143 01/31/2023   K 5.1 01/31/2023   CL 103 01/31/2023   CO2 21 01/31/2023   BUN 15 01/31/2023   CREATININE 0.68 01/31/2023   EGFR 97 01/31/2023   CALCIUM 9.9 01/31/2023   PROT 6.9 01/31/2023   ALBUMIN 4.4 01/31/2023   LABGLOB 2.5 01/31/2023   AGRATIO 1.8 01/31/2023   BILITOT 0.2 01/31/2023   ALKPHOS 99 01/31/2023   AST 18 01/31/2023   ALT 15 01/31/2023   ANIONGAP 9 01/23/2022   Last lipids Lab Results  Component Value Date   CHOL 194 01/31/2023   HDL 72 01/31/2023   LDLCALC 88 01/31/2023   TRIG 208 (H) 01/31/2023   CHOLHDL 2.7 01/31/2023   Last hemoglobin A1c Lab Results  Component Value Date   HGBA1C 6.1 (A) 04/25/2023   Last thyroid functions Lab Results  Component Value Date   TSH 1.890 03/15/2022   T4TOTAL 7.5 07/28/2020   Last vitamin D No results found for: "25OHVITD2", "25OHVITD3", "VD25OH" Last vitamin B12 and Folate No results found for: "VITAMINB12", "FOLATE"    The 10-year ASCVD risk score (Arnett DK, et al., 2019) is: 15.3%    Assessment & Plan:   Problem List Items Addressed This Visit       Endocrine   Diabetes (HCC) - Primary   Relevant Orders   POCT HgB A1C   POCT Glucose (CBG) (Completed)    No follow-ups on file.    Lutricia Horsfall, RN

## 2023-07-30 NOTE — Patient Instructions (Signed)

## 2023-07-31 DIAGNOSIS — M79602 Pain in left arm: Secondary | ICD-10-CM | POA: Insufficient documentation

## 2023-08-03 LAB — COMPREHENSIVE METABOLIC PANEL
ALT: 13 [IU]/L (ref 0–32)
AST: 17 [IU]/L (ref 0–40)
Albumin: 4.3 g/dL (ref 3.9–4.9)
Alkaline Phosphatase: 84 [IU]/L (ref 44–121)
BUN/Creatinine Ratio: 14 (ref 12–28)
BUN: 11 mg/dL (ref 8–27)
Bilirubin Total: 0.3 mg/dL (ref 0.0–1.2)
CO2: 23 mmol/L (ref 20–29)
Calcium: 10 mg/dL (ref 8.7–10.3)
Chloride: 103 mmol/L (ref 96–106)
Creatinine, Ser: 0.77 mg/dL (ref 0.57–1.00)
Globulin, Total: 2.3 g/dL (ref 1.5–4.5)
Glucose: 104 mg/dL — ABNORMAL HIGH (ref 70–99)
Potassium: 4.3 mmol/L (ref 3.5–5.2)
Sodium: 142 mmol/L (ref 134–144)
Total Protein: 6.6 g/dL (ref 6.0–8.5)
eGFR: 86 mL/min/{1.73_m2} (ref >59)

## 2023-08-03 LAB — CBC WITH DIFFERENTIAL/PLATELET
Basophils Absolute: 0 10*3/uL (ref 0.0–0.2)
Basos: 0 %
EOS (ABSOLUTE): 0 10*3/uL (ref 0.0–0.4)
Eos: 1 %
Hematocrit: 43.2 % (ref 34.0–46.6)
Hemoglobin: 13.9 g/dL (ref 11.1–15.9)
Immature Grans (Abs): 0 10*3/uL (ref 0.0–0.1)
Immature Granulocytes: 0 %
Lymphocytes Absolute: 2 10*3/uL (ref 0.7–3.1)
Lymphs: 36 %
MCH: 29.8 pg (ref 26.6–33.0)
MCHC: 32.2 g/dL (ref 31.5–35.7)
MCV: 93 fL (ref 79–97)
Monocytes Absolute: 0.5 10*3/uL (ref 0.1–0.9)
Monocytes: 9 %
Neutrophils Absolute: 3 10*3/uL (ref 1.4–7.0)
Neutrophils: 54 %
Platelets: 257 10*3/uL (ref 150–450)
RBC: 4.67 x10E6/uL (ref 3.77–5.28)
RDW: 12 % (ref 11.7–15.4)
WBC: 5.6 10*3/uL (ref 3.4–10.8)

## 2023-08-03 LAB — LIPID PANEL
Chol/HDL Ratio: 3 ratio (ref 0.0–4.4)
Cholesterol, Total: 197 mg/dL (ref 100–199)
HDL: 65 mg/dL (ref >39)
LDL Chol Calc (NIH): 110 mg/dL — ABNORMAL HIGH (ref 0–99)
Triglycerides: 124 mg/dL (ref 0–149)
VLDL Cholesterol Cal: 22 mg/dL (ref 5–40)

## 2023-08-03 LAB — TSH: TSH: 0.252 u[IU]/mL — ABNORMAL LOW (ref 0.450–4.500)

## 2023-08-05 ENCOUNTER — Other Ambulatory Visit: Payer: Self-pay

## 2023-08-08 ENCOUNTER — Other Ambulatory Visit: Payer: Self-pay | Admitting: Gerontology

## 2023-08-08 ENCOUNTER — Other Ambulatory Visit: Payer: Self-pay

## 2023-08-08 DIAGNOSIS — E039 Hypothyroidism, unspecified: Secondary | ICD-10-CM

## 2023-08-08 MED ORDER — LEVOTHYROXINE SODIUM 88 MCG PO TABS
88.0000 ug | ORAL_TABLET | Freq: Every day | ORAL | 0 refills | Status: DC
  Filled 2023-08-08: qty 40, 40d supply, fill #0

## 2023-08-09 ENCOUNTER — Other Ambulatory Visit: Payer: Self-pay

## 2023-08-09 ENCOUNTER — Other Ambulatory Visit: Payer: Self-pay | Admitting: Gerontology

## 2023-08-09 DIAGNOSIS — J4489 Other specified chronic obstructive pulmonary disease: Secondary | ICD-10-CM

## 2023-08-09 DIAGNOSIS — M109 Gout, unspecified: Secondary | ICD-10-CM

## 2023-08-11 ENCOUNTER — Other Ambulatory Visit: Payer: Self-pay

## 2023-08-12 ENCOUNTER — Other Ambulatory Visit: Payer: Self-pay

## 2023-08-12 MED FILL — Montelukast Sodium Tab 10 MG (Base Equiv): ORAL | 90 days supply | Qty: 90 | Fill #0 | Status: AC

## 2023-08-12 MED FILL — Allopurinol Tab 100 MG: ORAL | 90 days supply | Qty: 90 | Fill #0 | Status: AC

## 2023-08-26 ENCOUNTER — Other Ambulatory Visit: Payer: Self-pay | Admitting: Gerontology

## 2023-08-26 ENCOUNTER — Other Ambulatory Visit: Payer: Self-pay

## 2023-08-26 DIAGNOSIS — G4700 Insomnia, unspecified: Secondary | ICD-10-CM

## 2023-08-27 ENCOUNTER — Other Ambulatory Visit: Payer: Self-pay

## 2023-08-27 MED ORDER — TRAZODONE HCL 50 MG PO TABS
150.0000 mg | ORAL_TABLET | Freq: Every day | ORAL | 1 refills | Status: DC
  Filled 2023-08-27: qty 90, 30d supply, fill #0
  Filled 2023-09-27: qty 90, 30d supply, fill #1

## 2023-09-11 ENCOUNTER — Encounter: Payer: Self-pay | Admitting: Gerontology

## 2023-09-11 ENCOUNTER — Other Ambulatory Visit: Payer: Self-pay

## 2023-09-11 ENCOUNTER — Ambulatory Visit: Payer: Medicaid Other | Admitting: Gerontology

## 2023-09-11 VITALS — BP 159/89 | HR 77 | Ht 62.0 in | Wt 149.0 lb

## 2023-09-11 DIAGNOSIS — J4489 Other specified chronic obstructive pulmonary disease: Secondary | ICD-10-CM

## 2023-09-11 DIAGNOSIS — E039 Hypothyroidism, unspecified: Secondary | ICD-10-CM

## 2023-09-11 DIAGNOSIS — E785 Hyperlipidemia, unspecified: Secondary | ICD-10-CM

## 2023-09-11 DIAGNOSIS — I10 Essential (primary) hypertension: Secondary | ICD-10-CM

## 2023-09-11 MED ORDER — FLUTICASONE-SALMETEROL 250-50 MCG/ACT IN AEPB
1.0000 | INHALATION_SPRAY | Freq: Two times a day (BID) | RESPIRATORY_TRACT | 0 refills | Status: DC
  Filled 2023-09-11: qty 60, 30d supply, fill #0

## 2023-09-11 MED ORDER — LEVOTHYROXINE SODIUM 88 MCG PO TABS
88.0000 ug | ORAL_TABLET | Freq: Every day | ORAL | 0 refills | Status: DC
  Filled 2023-09-11: qty 60, 60d supply, fill #0

## 2023-09-11 MED ORDER — LEVOTHYROXINE SODIUM 88 MCG PO TABS
88.0000 ug | ORAL_TABLET | Freq: Every day | ORAL | 0 refills | Status: DC
  Filled 2023-09-11: qty 40, 40d supply, fill #0

## 2023-09-11 MED ORDER — LOSARTAN POTASSIUM 25 MG PO TABS
25.0000 mg | ORAL_TABLET | Freq: Every day | ORAL | 1 refills | Status: DC
  Filled 2023-09-11: qty 90, 90d supply, fill #0

## 2023-09-11 MED ORDER — ATORVASTATIN CALCIUM 10 MG PO TABS
10.0000 mg | ORAL_TABLET | Freq: Every day | ORAL | 1 refills | Status: DC
  Filled 2023-09-11: qty 90, 90d supply, fill #0

## 2023-09-11 NOTE — Patient Instructions (Signed)
Cooking With Less Salt Cooking with less salt is one way to reduce the amount of salt (sodium) you get from food. Most people should have less than 2,300 milligrams (mg) of sodium each day. If you have high blood pressure (hypertension), you may need to limit your sodium to 1,500 mg each day. Follow the tips below to help reduce your sodium intake. What are tips for eating less sodium? Reading food labels  Check the food label before buying or using packaged ingredients. Always check the label for the serving size and sodium content. Choose products with less than 140 mg of sodium per serving. Check the % Daily Value column to see what percent of the daily recommended amount of sodium is in one serving of the product. Foods with 5% or less are low in sodium. Foods with 20% or more are high in sodium. Do not choose foods that have salt as one of the first three ingredients on the ingredients list. Always check how much sodium is in a product, even if the label says "unsalted" or "no salt added." Shopping Buy sodium-free or low-sodium products. Look for these words: Low-sodium. Sodium-free. Reduced-sodium. No salt added. Unsalted. Buy fresh or frozen foods without sauces or additives. Cooking Instead of salt, use herbs, seasonings without salt, and spices. Use sodium-free baking soda. Grill, braise, or roast foods to add flavor with less salt. Do not add salt to pasta, rice, or hot cereals. Drain and rinse canned vegetables, beans, and meat before use. Do not add salt when cooking sweets and desserts. Cook with low-sodium ingredients. Meal planning The sodium in bread can add up. Try to plan meals with other grains. These may include whole oats, quinoa, whole wheat pasta, and other whole grains that do not have sodium added to them. What foods are high in sodium? Vegetables Regular canned vegetables, except low-sodium or reduced-sodium items. Sauerkraut, pickled vegetables, and relishes.  Olives. French fries. Onion rings. Regular canned tomato sauce and paste. Regular tomato and vegetable juice. Frozen vegetables in sauces. Grains Instant hot cereals. Bread stuffing, pancake, and biscuit mixes. Croutons. Seasoned rice or pasta mixes. Noodle soup cups. Boxed or frozen macaroni and cheese. Regular salted crackers. Self-rising flour. Rolls. Bagels. Flour tortillas and wraps. Meats and other proteins Meat or fish that is salted, canned, smoked, cured, spiced, or pickled. Precooked or cured meat, such as sausages or meat loaves. Bacon. Ham. Pepperoni. Hot dogs. Corned beef. Chipped beef. Salt pork. Jerky. Pickled herring, anchovies, and sardines. Regular canned tuna. Salted nuts. Dairy Processed cheese and cheese spreads. Hard cheeses. Cheese curds. Blue cheese. Feta cheese. String cheese. Regular cottage cheese. Buttermilk. Canned milk. The items listed above may not be a full list of foods high in sodium. Talk to a dietitian to learn more. What foods are low in sodium? Fruits Fresh, frozen, or canned fruit with no sauce added. Fruit juice. Vegetables Fresh or frozen vegetables with no sauce added. "No salt added" canned vegetables. "No salt added" tomato sauce and paste. Low-sodium or reduced-sodium tomato and vegetable juice. Grains Noodles, pasta, quinoa, rice. Shredded or puffed wheat or puffed rice. Regular or quick oats (not instant). Low-sodium crackers. Low-sodium bread. Whole grain bread and whole grain pasta. Unsalted popcorn. Meats and other proteins Fresh or frozen whole meats, poultry that has not been injected with sodium, and fish with no sauce added. Unsalted nuts. Dried peas, beans, and lentils without added salt. Unsalted canned beans. Eggs. Unsalted nut butters. Low-sodium canned tuna or chicken. Dairy   Milk. Soy milk. Yogurt. Low-sodium cheeses, such as Swiss, Monterey Jack, mozzarella, and ricotta. Sherbet or ice cream (keep to  cup per serving). Cream  cheese. Fats and oils Unsalted butter or margarine. Other foods Homemade pudding. Sodium-free baking soda and baking powder. Herbs and spices. Low-sodium seasoning mixes. Beverages Coffee and tea. Carbonated beverages. The items listed above may not be a full list of foods low in sodium. Talk to a dietitian to learn more. What are some salt alternatives when cooking? Herbs, seasonings, and spices can be used instead of salt to flavor your food. Herbs should be fresh or dried. Do not choose packaged mixes. Next to the name of the herb, spice, or seasoning below are some foods you can pair it with. Herbs Bay leaves - Soups, meat and vegetable dishes, and spaghetti sauce. Basil - Italian dishes, soups, pasta, and fish dishes. Cilantro - Meat, poultry, and vegetable dishes. Chili powder - Marinades and Mexican dishes. Chives - Salad dressings and potato dishes. Cumin - Mexican dishes, couscous, and meat dishes. Dill - Fish dishes, sauces, and salads. Fennel - Meat and vegetable dishes, breads, and cookies. Garlic (do not use garlic salt) - Italian dishes, meat dishes, salad dressings, and sauces. Marjoram - Soups, potato dishes, and meat dishes. Oregano - Pizza and spaghetti sauce. Parsley - Salads, soups, pasta, and meat dishes. Rosemary - Italian dishes, salad dressings, soups, and red meats. Saffron - Fish dishes, pasta, and some poultry dishes. Sage - Stuffings and sauces. Tarragon - Fish and poultry dishes. Thyme - Stuffing, meat, and fish dishes. Seasonings Lemon juice - Fish dishes, poultry dishes, vegetables, and salads. Vinegar - Salad dressings, vegetables, and fish dishes. Spices Cinnamon - Sweet dishes, such as cakes, cookies, and puddings. Cloves - Gingerbread, puddings, and marinades for meats. Curry - Vegetable dishes, fish and poultry dishes, and stir-fry dishes. Ginger - Vegetable dishes, fish dishes, and stir-fry dishes. Nutmeg - Pasta, vegetables, poultry, fish  dishes, and custard. This information is not intended to replace advice given to you by your health care provider. Make sure you discuss any questions you have with your health care provider. Document Revised: 10/18/2022 Document Reviewed: 10/11/2022 Elsevier Patient Education  2024 Elsevier Inc.  

## 2023-09-11 NOTE — Progress Notes (Signed)
 Established Patient Office Visit  Subjective   Patient ID: Amy Knapp, female    DOB: Feb 22, 1956  Age: 67 y.o. MRN: [id number]  Chief Complaint  Patient presents with  . Follow-up    HPI  Amy Knapp is a 67 y/o female with a history of Allergy, Anxiety, Arthritis, Asthma, COPD, Depression, Diabetes, who presents for follow up       Patient Active Problem List   Diagnosis Date Noted  . Left arm pain 07/31/2023  . Needs assistance with community resources 02/07/2023  . Cerumen impaction 01/22/2023  . Otitis media 01/03/2023  . Acute bronchitis with COPD (HCC) 09/19/2022  . Sinusitis 09/19/2022  . Headache 08/23/2022  . Cough 04/05/2022  . Nasal congestion with rhinorrhea 04/05/2022  . Right flank pain 03/22/2022  . Thickened nails 02/01/2022  . Left-sided chest pain 11/02/2021  . S/P repair of paraesophageal hernia 08/29/2021  . Complex renal cyst 06/24/2021  . Vision abnormalities 06/23/2021  . Sleep concern 06/22/2021  . Lump of skin of back 05/18/2021  . Hiatal hernia 04/20/2021  . Essential hypertension 04/20/2021  . Diarrhea   . Dysphagia   . Right knee pain 03/23/2021  . Acute leg pain, left 12/22/2020  . Type 2 diabetes mellitus with diabetic neuropathy, unspecified (HCC) 03/10/2020  . Nipple discharge 03/10/2020  . Flu vaccine need 11/13/2016  . Hyperlipidemia 08/14/2016  . Edema of extremities 08/14/2016  . Back pain 12/09/2014  . Hypothyroidism 11/11/2014  . COPD (chronic obstructive pulmonary disease) (HCC) 11/11/2014  . Gout 11/11/2014  . Seborrheic keratosis 10/28/2014  . Diabetes (HCC) 09/14/2014  . Sleep apnea 06/29/2014   Past Medical History:  Diagnosis Date  . Allergy   . Anxiety   . Arthritis   . Asthma   . Broken leg 2007   Right  . Cataract   . Complex renal cyst 06/24/2021  . COPD (chronic obstructive pulmonary disease) (HCC)   . Depression   . Diabetes mellitus without complication (HCC)   .  Emphysema of lung (HCC)   . Essential hypertension 04/20/2021  . GERD (gastroesophageal reflux disease)   . Gout   . History of hiatal hernia   . Hyperlipidemia   . Hypothyroidism   . Sleep apnea   . Thyroid disease    Past Surgical History:  Procedure Laterality Date  . ABDOMINAL HYSTERECTOMY  2000   Partial  . COLONOSCOPY WITH PROPOFOL N/A 04/11/2021   Procedure: COLONOSCOPY WITH PROPOFOL;  Surgeon: Midge Minium, MD;  Location[facility] ENDOSCOPY;  Service: Endoscopy;  Laterality: N/A;  . ESOPHAGOGASTRODUODENOSCOPY N/A 04/11/2021   Procedure: ESOPHAGOGASTRODUODENOSCOPY (EGD);  Surgeon: Midge Minium, MD;  Location: [facility] ENDOSCOPY;  Service: Endoscopy;  Laterality: N/A;  . HERNIA REPAIR    . INSERTION OF MESH  08/29/2021   Procedure: INSERTION OF MESH;  Surgeon: Leafy Ro, MD;  Location: [facility] ORS;  Service: General;;  . LEG SURGERY Right 10/08/2005   broken right lower leg; plate and screws  . PATELLA FRACTURE SURGERY Right   . TUBAL LIGATION    . XI ROBOTIC ASSISTED HIATAL HERNIA REPAIR N/A 08/29/2021   Procedure: XI ROBOTIC ASSISTED HIATAL HERNIA REPAIR, RNFA to assist;  Surgeon: Leafy Ro, MD;  Location: [facility] ORS;  Service: General;  Laterality: N/A;   Family History  Problem Relation Age of Onset  . Cancer Mother   . Heart attack Father   . Cancer Brother        colon  . Heart attack Brother  67  . Diabetes Daughter   . Diabetes Son   . Hypertension Maternal Aunt   . Breast cancer Neg Hx    Allergies  Allergen Reactions  . Celery Oil     itching  . Demerol [Meperidine]     Burning, feels hot  . Morphine And Codeine Other (See Comments)    Muscle Spasms  . Penicillins     blisters      ROS    Objective:     BP (!) 159/89   Pulse 77   Ht 5\' 2"  (1.575 m)   Wt 149 lb (67.6 kg)   SpO2 92%   BMI 27.25 kg/m  BP Readings from Last 3 Encounters:  09/11/23 (!) 159/89  07/30/23 (!) 153/81  04/25/23 128/82   Wt Readings from Last 3 Encounters:   09/11/23 149 lb (67.6 kg)  07/30/23 148 lb (67.1 kg)  04/25/23 156 lb 15.5 oz (71.2 kg)      Physical Exam   No results found for any visits on 09/11/23.  Last CBC Lab Results  Component Value Date   WBC 5.6 07/30/2023   HGB 13.9 07/30/2023   HCT 43.2 07/30/2023   MCV 93 07/30/2023   MCH 29.8 07/30/2023   RDW 12.0 07/30/2023   PLT 257 07/30/2023   Last metabolic panel Lab Results  Component Value Date   GLUCOSE 104 (H) 07/30/2023   NA 142 07/30/2023   K 4.3 07/30/2023   CL 103 07/30/2023   CO2 23 07/30/2023   BUN 11 07/30/2023   CREATININE 0.77 07/30/2023   EGFR 86 07/30/2023   CALCIUM 10.0 07/30/2023   PROT 6.6 07/30/2023   ALBUMIN 4.3 07/30/2023   LABGLOB 2.3 07/30/2023   AGRATIO 1.8 01/31/2023   BILITOT 0.3 07/30/2023   ALKPHOS 84 07/30/2023   AST 17 07/30/2023   ALT 13 07/30/2023   ANIONGAP 9 01/23/2022   Last lipids Lab Results  Component Value Date   CHOL 197 07/30/2023   HDL 65 07/30/2023   LDLCALC 110 (H) 07/30/2023   TRIG 124 07/30/2023   CHOLHDL 3.0 07/30/2023   Last hemoglobin A1c Lab Results  Component Value Date   HGBA1C 5.9 (A) 07/30/2023   Last thyroid functions Lab Results  Component Value Date   TSH 0.252 (L) 07/30/2023   T4TOTAL 7.5 07/28/2020   Last vitamin D No results found for: "25OHVITD2", "25OHVITD3", "VD25OH" Last vitamin B12 and Folate No results found for: "VITAMINB12", "FOLATE"    The 10-year ASCVD risk score (Arnett DK, et al., 2019) is: 17.4%    Assessment & Plan:   Problem List Items Addressed This Visit   None   No follow-ups on file.    Amy Corns, RN

## 2023-09-11 NOTE — Progress Notes (Unsigned)
Established Patient Office Visit  Subjective   Patient ID: Amy Knapp, female    DOB: June 21, 1956  Age: 67 y.o. MRN: [id number]  Chief Complaint  Patient presents with   Follow-up    HPI  Amy Knapp is a 67 y/o female with a history of Allergy, Anxiety, Arthritis, Asthma, COPD, Depression, Diabetes, who presents for follow up visit. Her Levothyroxine was decreased to 88 mcg for TSH of 0.252 uIU/ml. She denies any side effects, adheres to her medications. She states that her breathing is stable, though intermittent shortness of breath and wheezing. She denies chest pain, palpitation, light headedness and vision changes. Today is her last visit because she has active Medicare. She states that her mood is good, denies suicidal nor homicidal ideation. Overall, she states that she's doing well and offers no further complaint.  Review of Systems  Constitutional: Negative.   Respiratory:  Positive for wheezing.   Cardiovascular: Negative.   Neurological: Negative.   Psychiatric/Behavioral: Negative.        Objective:     BP (!) 159/89   Pulse 77   Ht 5\' 2"  (1.575 m)   Wt 149 lb (67.6 kg)   SpO2 92%   BMI 27.25 kg/m  BP Readings from Last 3 Encounters:  09/11/23 (!) 159/89  07/30/23 (!) 153/81  04/25/23 128/82   Wt Readings from Last 3 Encounters:  09/11/23 149 lb (67.6 kg)  07/30/23 148 lb (67.1 kg)  04/25/23 156 lb 15.5 oz (71.2 kg)      Physical Exam HENT:     Head: Normocephalic and atraumatic.     Mouth/Throat:     Mouth: Mucous membranes are moist.  Eyes:     Pupils: Pupils are equal, round, and reactive to light.  Cardiovascular:     Rate and Rhythm: Normal rate and regular rhythm.     Pulses: Normal pulses.     Heart sounds: Normal heart sounds.  Pulmonary:     Effort: Pulmonary effort is normal.     Breath sounds: Normal breath sounds.  Skin:    General: Skin is warm.  Neurological:     General: No focal deficit present.      Mental Status: She is alert and oriented to person, place, and time.  Psychiatric:        Mood and Affect: Mood normal.        Behavior: Behavior normal.        Thought Content: Thought content normal.        Judgment: Judgment normal.      No results found for any visits on 09/11/23.  Last CBC Lab Results  Component Value Date   WBC 5.6 07/30/2023   HGB 13.9 07/30/2023   HCT 43.2 07/30/2023   MCV 93 07/30/2023   MCH 29.8 07/30/2023   RDW 12.0 07/30/2023   PLT 257 07/30/2023   Last metabolic panel Lab Results  Component Value Date   GLUCOSE 104 (H) 07/30/2023   NA 142 07/30/2023   K 4.3 07/30/2023   CL 103 07/30/2023   CO2 23 07/30/2023   BUN 11 07/30/2023   CREATININE 0.77 07/30/2023   EGFR 86 07/30/2023   CALCIUM 10.0 07/30/2023   PROT 6.6 07/30/2023   ALBUMIN 4.3 07/30/2023   LABGLOB 2.3 07/30/2023   AGRATIO 1.8 01/31/2023   BILITOT 0.3 07/30/2023   ALKPHOS 84 07/30/2023   AST 17 07/30/2023   ALT 13 07/30/2023   ANIONGAP 9 01/23/2022   Last lipids  Lab Results  Component Value Date   CHOL 197 07/30/2023   HDL 65 07/30/2023   LDLCALC 110 (H) 07/30/2023   TRIG 124 07/30/2023   CHOLHDL 3.0 07/30/2023   Last hemoglobin A1c Lab Results  Component Value Date   HGBA1C 5.9 (A) 07/30/2023   Last thyroid functions Lab Results  Component Value Date   TSH 0.252 (L) 07/30/2023   T4TOTAL 7.5 07/28/2020   Last vitamin D No results found for: "25OHVITD2", "25OHVITD3", "VD25OH" Last vitamin B12 and Folate No results found for: "VITAMINB12", "FOLATE"    The 10-year ASCVD risk score (Arnett DK, et al., 2019) is: 17.4%    Assessment & Plan:   1. Hyperlipidemia, unspecified hyperlipidemia type - She will continue current medication, low fat/cholesterol diet, unable to make any medication changes because of active Medicare. - atorvastatin (LIPITOR) 10 MG tablet; Take 1 tablet (10 mg total) by mouth daily.  Dispense: 90 tablet; Refill: 1  2. Chronic  obstructive asthma (HCC) - Her breathing is stable, no wheezing , will continue current medication. - fluticasone-salmeterol (WIXELA INHUB) 250-50 MCG/ACT AEPB; Inhale 1 puff into the lungs in the morning and at bedtime.  Dispense: 60 each; Refill: 0  3. Hypothyroidism, unspecified type - She will continue on current Levothyroxine dose until she follows up with a new PCP. - levothyroxine (SYNTHROID) 88 MCG tablet; Take 1 tablet (88 mcg total) by mouth daily before breakfast.  Dispense: 60 tablet; Refill: 0  4. Essential hypertension - Her blood pressure is not under control, will continue current medication, DASH diet and exercise as tolerated. - losartan (COZAAR) 25 MG tablet; Take 1 tablet (25 mg total) by mouth once daily.  Dispense: 90 tablet; Refill: 1   No follow-ups on file. No follow up appointment, because of Active Medicare. [facility] whishes her well with her care.   Amy Knapp Trellis Paganini, NP

## 2023-09-12 ENCOUNTER — Other Ambulatory Visit: Payer: Self-pay

## 2023-09-27 ENCOUNTER — Other Ambulatory Visit: Payer: Self-pay

## 2023-11-19 ENCOUNTER — Other Ambulatory Visit: Payer: Self-pay

## 2023-11-19 MED FILL — Allopurinol Tab 100 MG: ORAL | 30 days supply | Qty: 30 | Fill #1 | Status: CN

## 2023-11-19 MED FILL — Montelukast Sodium Tab 10 MG (Base Equiv): ORAL | 30 days supply | Qty: 30 | Fill #1 | Status: CN

## 2023-11-29 ENCOUNTER — Other Ambulatory Visit: Payer: Self-pay

## 2023-11-29 ENCOUNTER — Ambulatory Visit: Payer: No Typology Code available for payment source | Admitting: Pediatrics

## 2023-11-29 ENCOUNTER — Encounter: Payer: Self-pay | Admitting: Pediatrics

## 2023-11-29 VITALS — BP 170/96 | HR 72 | Ht 62.0 in | Wt 148.8 lb

## 2023-11-29 DIAGNOSIS — E039 Hypothyroidism, unspecified: Secondary | ICD-10-CM | POA: Diagnosis not present

## 2023-11-29 DIAGNOSIS — R519 Headache, unspecified: Secondary | ICD-10-CM | POA: Diagnosis not present

## 2023-11-29 DIAGNOSIS — Z1330 Encounter for screening examination for mental health and behavioral disorders, unspecified: Secondary | ICD-10-CM

## 2023-11-29 DIAGNOSIS — I10 Essential (primary) hypertension: Secondary | ICD-10-CM | POA: Diagnosis not present

## 2023-11-29 DIAGNOSIS — G4700 Insomnia, unspecified: Secondary | ICD-10-CM | POA: Diagnosis not present

## 2023-11-29 DIAGNOSIS — Z7689 Persons encountering health services in other specified circumstances: Secondary | ICD-10-CM

## 2023-11-29 DIAGNOSIS — E1140 Type 2 diabetes mellitus with diabetic neuropathy, unspecified: Secondary | ICD-10-CM

## 2023-11-29 DIAGNOSIS — J449 Chronic obstructive pulmonary disease, unspecified: Secondary | ICD-10-CM

## 2023-11-29 DIAGNOSIS — M109 Gout, unspecified: Secondary | ICD-10-CM

## 2023-11-29 DIAGNOSIS — E785 Hyperlipidemia, unspecified: Secondary | ICD-10-CM | POA: Diagnosis not present

## 2023-11-29 DIAGNOSIS — Z59819 Housing instability, housed unspecified: Secondary | ICD-10-CM | POA: Diagnosis not present

## 2023-11-29 DIAGNOSIS — M1A09X0 Idiopathic chronic gout, multiple sites, without tophus (tophi): Secondary | ICD-10-CM

## 2023-11-29 DIAGNOSIS — J4489 Other specified chronic obstructive pulmonary disease: Secondary | ICD-10-CM

## 2023-11-29 MED ORDER — MONTELUKAST SODIUM 10 MG PO TABS
10.0000 mg | ORAL_TABLET | Freq: Every day | ORAL | 1 refills | Status: DC
  Filled 2023-11-29: qty 90, 90d supply, fill #0
  Filled 2024-03-04: qty 90, 90d supply, fill #1

## 2023-11-29 MED ORDER — OLMESARTAN MEDOXOMIL 20 MG PO TABS
20.0000 mg | ORAL_TABLET | Freq: Every day | ORAL | 2 refills | Status: DC
  Filled 2023-11-29: qty 30, 30d supply, fill #0

## 2023-11-29 MED ORDER — TRAZODONE HCL 50 MG PO TABS
150.0000 mg | ORAL_TABLET | Freq: Every day | ORAL | 2 refills | Status: DC
  Filled 2023-11-29: qty 90, 30d supply, fill #0
  Filled 2024-01-06: qty 90, 30d supply, fill #1
  Filled 2024-02-12: qty 90, 30d supply, fill #2

## 2023-11-29 MED ORDER — ATORVASTATIN CALCIUM 10 MG PO TABS
10.0000 mg | ORAL_TABLET | Freq: Every day | ORAL | 3 refills | Status: AC
  Filled 2023-11-29: qty 90, 90d supply, fill #0
  Filled 2024-03-04: qty 90, 90d supply, fill #1
  Filled 2024-06-10: qty 90, 90d supply, fill #2
  Filled 2024-09-08: qty 90, 90d supply, fill #3

## 2023-11-29 MED ORDER — ALLOPURINOL 100 MG PO TABS
100.0000 mg | ORAL_TABLET | Freq: Every day | ORAL | 3 refills | Status: AC
  Filled 2023-11-29: qty 90, 90d supply, fill #0
  Filled 2024-03-04: qty 90, 90d supply, fill #1
  Filled 2024-06-10: qty 90, 90d supply, fill #2
  Filled 2024-09-08: qty 90, 90d supply, fill #3

## 2023-11-29 NOTE — Progress Notes (Signed)
 Office Visit  BP (!) 170/96 (BP Location: Right Arm, Patient Position: Sitting, Cuff Size: Normal)   Pulse 72   Ht 5\' 2"  (1.575 m)   Wt 148 lb 12.8 oz (67.5 kg)   SpO2 96%   BMI 27.22 kg/m    Subjective:    Patient ID: Amy Knapp, female    DOB: [birth year], 68 y.o.   MRN: [id number]  HPI: Amy Knapp is a 68 y.o. female  Chief Complaint  Patient presents with   New Patient (Initial Visit)    Establish Medication refill Headache--left side of the head, blurry vision, dizziness--3 years. Tried tylenol for temporary relief.    Discussed the use of AI scribe software for clinical note transcription with the patient, who gave verbal consent to proceed.  History of Present Illness   Amy Knapp is a 68 year old female with hypertension and diabetes who presents for medication refills and evaluation of headaches.  She has been experiencing headaches almost daily for the past two years, with pain typically starting on one side of her head and persisting throughout the day. Tylenol provides partial relief, but the frequency of the headaches has increased over time. No imaging studies of her head have been performed.  She has a history of hypertension and has been taking losartan for the past two years. Her blood pressure fluctuates and is often high when measured at home, with no adjustments made to her medication despite these fluctuations. No headaches or dizziness are associated with her high blood pressure.  She has a history of diabetes, previously managed with metformin and glyburide, both of which caused significant diarrhea. She has since managed her diabetes through dietary changes and weight loss, resulting in stable A1c levels below 7 for the past two years. She reports a significant weight loss of about 158 to 160 pounds, which has helped control her diabetes.  She has a history of COPD and uses inhalers, including Wixela and Singulair.  She is unsure if her current inhalers are covered by her insurance and has experienced issues with the cost of medications in the past.  She reports significant stressors in her life, including family issues and financial concerns. She is currently living in senior housing and is on a waiting list for more affordable housing options. She works part-time and receives [employer], but struggles with financial stability.     Relevant past medical, surgical, family and social history reviewed and updated as indicated. Interim medical history since our last visit reviewed. Allergies and medications reviewed and updated.  ROS per HPI unless specifically indicated above     Objective:    BP (!) 170/96 (BP Location: Right Arm, Patient Position: Sitting, Cuff Size: Normal)   Pulse 72   Ht 5\' 2"  (1.575 m)   Wt 148 lb 12.8 oz (67.5 kg)   SpO2 96%   BMI 27.22 kg/m   Wt Readings from Last 3 Encounters:  11/29/23 148 lb 12.8 oz (67.5 kg)  09/11/23 149 lb (67.6 kg)  07/30/23 148 lb (67.1 kg)     Physical Exam Constitutional:      Appearance: Normal appearance.  HENT:     Head: Normocephalic and atraumatic.  Eyes:     Pupils: Pupils are equal, round, and reactive to light.  Cardiovascular:     Rate and Rhythm: Normal rate and regular rhythm.     Pulses: Normal pulses.     Heart sounds: Normal heart sounds.  Pulmonary:  Effort: Pulmonary effort is normal.     Breath sounds: Normal breath sounds.  Musculoskeletal:        General: Normal range of motion.     Cervical back: Normal range of motion.  Skin:    General: Skin is warm and dry.     Capillary Refill: Capillary refill takes less than 2 seconds.  Neurological:     General: No focal deficit present.     Mental Status: She is alert. Mental status is at baseline.  Psychiatric:        Mood and Affect: Mood normal.        Behavior: Behavior normal.         11/29/2023    1:47 PM 07/30/2023   12:52 PM 04/25/2023     3:44 PM 03/14/2023    3:54 PM 01/22/2023    3:29 PM  Depression screen PHQ 2/9  Decreased Interest 0 0 0 1 0  Down, Depressed, Hopeless 0 0 0 1 0  PHQ - 2 Score 0 0 0 2 0  Altered sleeping 3  1 1 3   Tired, decreased energy 2  1 2 3   Change in appetite 3  2 0 3  Feeling bad or failure about yourself  0  0 0 3  Trouble concentrating 0  0 0 0  Moving slowly or fidgety/restless 0  0 0 3  Suicidal thoughts 0  0 0 0  PHQ-9 Score 8  4 5 15   Difficult doing work/chores Somewhat difficult   Not difficult at all Somewhat difficult       11/29/2023    1:48 PM 03/14/2023    3:57 PM 11/20/2022    4:15 PM 11/08/2022    3:58 PM  GAD 7 : Generalized Anxiety Score  Nervous, Anxious, on Edge 1 3 2 2   Control/stop worrying 3 1 3 3   Worry too much - different things 3 0 3 3  Trouble relaxing 3 2 2  0  Restless 1 2 2 1   Easily annoyed or irritable 3 2 1 3   Afraid - awful might happen 3 0 0 0  Total GAD 7 Score 17 10 13 12   Anxiety Difficulty Somewhat difficult Not difficult at all Somewhat difficult        Assessment & Plan:  Assessment & Plan   Primary hypertension Assessment & Plan: Uncontrolled on Losartan with fluctuating blood pressure readings. Discussed the short-acting nature of Losartan and proposed switching to a longer-acting medication. -Switch to Olmesartan for better blood pressure control. -Check blood pressure at home and follow up in 2 weeks to assess response to new medication.  Orders: -     Olmesartan Medoxomil; Take 1 tablet (20 mg total) by mouth daily.  Dispense: 30 tablet; Refill: 2 -     Basic metabolic panel  Type 2 diabetes mellitus with diabetic neuropathy, without long-term current use of insulin (HCC) Assessment & Plan: Previously on Metformin and Glipizide, but discontinued due to gastrointestinal side effects. Patient has managed to control blood sugar with lifestyle modifications and weight loss. -Order baseline blood work to assess current blood sugar  control. -Continue management with lifestyle modifications.  Orders: -     Hemoglobin A1c  Hypothyroidism, unspecified type Assessment & Plan: Has been off of synthroid. Will check baseline and levels and then send synthroid.  Orders: -     Thyroid Panel With TSH  Hyperlipidemia, unspecified hyperlipidemia type Assessment & Plan: Chronic. Sending refills as below.  Orders: -  Atorvastatin Calcium; Take 1 tablet (10 mg total) by mouth daily.  Dispense: 90 tablet; Refill: 3  Chronic gout of multiple sites, unspecified cause Assessment & Plan: Chronic issue. Managed with allopurinol. Takes 100mg  daily. Sent refills below.  Orders: -     Allopurinol; Take 1 tablet (100 mg total) by mouth daily.  Dispense: 90 tablet; Refill: 3  Chronic obstructive pulmonary disease, unspecified COPD type (HCC) Assessment & Plan: On Wixela and Singulair. Uncertain about the effectiveness of current treatment and concerns about the cost of medication. -Consult with pharmacist to review inhaler options and ensure affordability with patient's insurance. -Continue current treatment until further review.  Orders: -     Montelukast Sodium; Take 1 tablet (10 mg total) by mouth at bedtime.  Dispense: 90 tablet; Refill: 1  Nonintractable headache, unspecified chronicity pattern, unspecified headache type Assessment & Plan: Daily headaches for the past two years, localized to one side of the head. No prior imaging done. -Order imaging of the head to rule out serious causes such as stroke or tumor. -Consider the possibility that headaches may be related to uncontrolled hypertension.   Insomnia, unspecified type Assessment & Plan: Chronic. She has been on 150mg  at bedtime. Sometimes works but inconsistent.  Orders: -     traZODone HCl; Take 3 tablets (150 mg total) by mouth once daily at bedtime.  Dispense: 90 tablet; Refill: 2  Encounter to establish care Reviewed available patient record  including history, medications, problem list. HM updated as able. Will review and/or request outside records (if applicable) and will fill remaining HM gaps as needed at follow up visit.  Encounter for behavioral health screening As part of their intake evaluation, the patient was screened for depression, anxiety.  PHQ9 SCORE 8, GAD7 SCORE 17. Screening results positive for tested conditions. Will address in future visit.  Housing insecurity Patient dealing with significant social stressors including housing and food insecurity. -Refer to Child psychotherapist for assistance with housing and other resources. -Inform patient about available food pantry at the clinic for future needs. -     AMB Referral [facility] Care Management    Follow up plan: Return in about 2 weeks (around 12/13/2023) for COPD, HTN, DM.  Jackolyn Confer, MD

## 2023-11-29 NOTE — Patient Instructions (Addendum)
We are starting a new blood pressure medication called olmesartan 20mg . Stop the losartan 25mg .   We are getting some baseline blood work today.   I am going to start working on the MRI for you headaches.  Return in 2 weeks.  Good to meet you! Welcome to [facility]!  As your primary care doctor, I look forward to working with you to help you reach your health goals.  Please be aware of a couple of logistical items: - If you message me on mychart, it may take me 1-2 business days to get back to you. This is for non-urgent messaging.  - If you require urgent clinical attention, please call the clinic or present to urgent care/emergency room - If you have labs, I typically will send a message about them in 1-2 business days. - I am not here on Mondays, otherwise will be available from Tuesday-Friday during 8a-5pm.

## 2023-11-30 LAB — HEMOGLOBIN A1C
Est. average glucose Bld gHb Est-mCnc: 128 mg/dL
Hgb A1c MFr Bld: 6.1 % — ABNORMAL HIGH (ref 4.8–5.6)

## 2023-11-30 LAB — THYROID PANEL WITH TSH
Free Thyroxine Index: 0.8 — ABNORMAL LOW (ref 1.2–4.9)
T3 Uptake Ratio: 17 % — ABNORMAL LOW (ref 24–39)
T4, Total: 4.5 ug/dL (ref 4.5–12.0)
TSH: 20.8 u[IU]/mL — ABNORMAL HIGH (ref 0.450–4.500)

## 2023-11-30 LAB — BASIC METABOLIC PANEL
BUN/Creatinine Ratio: 15 (ref 12–28)
BUN: 12 mg/dL (ref 8–27)
CO2: 27 mmol/L (ref 20–29)
Calcium: 10.4 mg/dL — ABNORMAL HIGH (ref 8.7–10.3)
Chloride: 101 mmol/L (ref 96–106)
Creatinine, Ser: 0.82 mg/dL (ref 0.57–1.00)
Glucose: 91 mg/dL (ref 70–99)
Potassium: 4.2 mmol/L (ref 3.5–5.2)
Sodium: 140 mmol/L (ref 134–144)
eGFR: 79 mL/min/{1.73_m2} (ref >59)

## 2023-12-02 ENCOUNTER — Telehealth: Payer: Self-pay | Admitting: *Deleted

## 2023-12-02 NOTE — Progress Notes (Signed)
 Complex Care Management Note  Care Guide Note 12/02/2023 Name: Amy Knapp MRN: [id number] DOB: 1956/06/25  Amy Knapp is a 68 y.o. year old female who sees Evelene Croon, Atilano Median, MD for primary care. I reached out to Lorin Picket by phone today to offer complex care management services.  [name] was given information about Complex Care Management services today including:   The Complex Care Management services include support from the care team which includes your Nurse Care Manager, Clinical Social Worker, or Pharmacist.  The Complex Care Management team is here to help remove barriers to the health concerns and goals most important to you. Complex Care Management services are voluntary, and the patient may decline or stop services at any time by request to their care team member.   Complex Care Management Consent Status: Patient agreed to services and verbal consent obtained.   Follow up plan:  Telephone appointment with complex care management team member scheduled for:  12/06/2023  Encounter Outcome:  Patient Scheduled  Burman Nieves, CMA, Care Guide [facility]  [facility], [facility] Guide Direct Dial: [phone]  Fax: [phone] Website: [email or web address]

## 2023-12-03 ENCOUNTER — Other Ambulatory Visit: Payer: Self-pay

## 2023-12-06 ENCOUNTER — Ambulatory Visit: Payer: Self-pay

## 2023-12-06 ENCOUNTER — Other Ambulatory Visit: Payer: Self-pay

## 2023-12-06 ENCOUNTER — Other Ambulatory Visit: Payer: Self-pay | Admitting: Pediatrics

## 2023-12-06 DIAGNOSIS — E039 Hypothyroidism, unspecified: Secondary | ICD-10-CM

## 2023-12-06 MED ORDER — LEVOTHYROXINE SODIUM 100 MCG PO TABS
100.0000 ug | ORAL_TABLET | Freq: Every day | ORAL | 2 refills | Status: DC
Start: 2023-12-06 — End: 2024-04-15
  Filled 2023-12-06: qty 30, 30d supply, fill #0
  Filled 2024-01-06: qty 30, 30d supply, fill #1
  Filled 2024-03-04: qty 30, 30d supply, fill #2

## 2023-12-06 NOTE — Patient Outreach (Signed)
 Care Coordination   12/06/2023 Name: Amy Knapp MRN: [id number] DOB: 11-15-1955   Care Coordination Outreach Attempts:  An unsuccessful outreach was attempted for an appointment today.  Follow Up Plan:  Additional outreach attempts will be made to offer the patient complex care management information and services.   Encounter Outcome:  No Answer   Care Coordination Interventions:  No, not indicated    Lysle Morales, BSW [place]  [facility], [facility] Social Worker Direct Dial: [phone]  Fax: [phone] Website: [email or web address]

## 2023-12-08 ENCOUNTER — Encounter: Payer: Self-pay | Admitting: Pediatrics

## 2023-12-08 DIAGNOSIS — G4700 Insomnia, unspecified: Secondary | ICD-10-CM | POA: Insufficient documentation

## 2023-12-08 NOTE — Assessment & Plan Note (Signed)
 Has been off of synthroid. Will check baseline and levels and then send synthroid.

## 2023-12-08 NOTE — Assessment & Plan Note (Signed)
 Previously on Metformin and Glipizide, but discontinued due to gastrointestinal side effects. Patient has managed to control blood sugar with lifestyle modifications and weight loss. -Order baseline blood work to assess current blood sugar control. -Continue management with lifestyle modifications.

## 2023-12-08 NOTE — Assessment & Plan Note (Signed)
 Chronic issue. Managed with allopurinol. Takes 100mg  daily. Sent refills below.

## 2023-12-08 NOTE — Assessment & Plan Note (Signed)
 Chronic. Sending refills as below.

## 2023-12-08 NOTE — Assessment & Plan Note (Signed)
 Uncontrolled on Losartan with fluctuating blood pressure readings. Discussed the short-acting nature of Losartan and proposed switching to a longer-acting medication. -Switch to Olmesartan for better blood pressure control. -Check blood pressure at home and follow up in 2 weeks to assess response to new medication.

## 2023-12-08 NOTE — Assessment & Plan Note (Signed)
 Daily headaches for the past two years, localized to one side of the head. No prior imaging done. -Order imaging of the head to rule out serious causes such as stroke or tumor. -Consider the possibility that headaches may be related to uncontrolled hypertension.

## 2023-12-08 NOTE — Assessment & Plan Note (Signed)
 Chronic. She has been on 150mg  at bedtime. Sometimes works but inconsistent.

## 2023-12-08 NOTE — Assessment & Plan Note (Signed)
 On Wixela and Singulair. Uncertain about the effectiveness of current treatment and concerns about the cost of medication. -Consult with pharmacist to review inhaler options and ensure affordability with patient's insurance. -Continue current treatment until further review.

## 2023-12-13 ENCOUNTER — Ambulatory Visit (INDEPENDENT_AMBULATORY_CARE_PROVIDER_SITE_OTHER): Payer: No Typology Code available for payment source | Admitting: Pediatrics

## 2023-12-13 ENCOUNTER — Other Ambulatory Visit: Payer: Self-pay

## 2023-12-13 VITALS — BP 128/82 | HR 70 | Ht 62.0 in | Wt 150.8 lb

## 2023-12-13 DIAGNOSIS — R519 Headache, unspecified: Secondary | ICD-10-CM

## 2023-12-13 DIAGNOSIS — I10 Essential (primary) hypertension: Secondary | ICD-10-CM | POA: Diagnosis not present

## 2023-12-13 DIAGNOSIS — Z1330 Encounter for screening examination for mental health and behavioral disorders, unspecified: Secondary | ICD-10-CM | POA: Diagnosis not present

## 2023-12-13 DIAGNOSIS — E039 Hypothyroidism, unspecified: Secondary | ICD-10-CM | POA: Diagnosis not present

## 2023-12-13 DIAGNOSIS — Z5941 Food insecurity: Secondary | ICD-10-CM | POA: Diagnosis not present

## 2023-12-13 MED ORDER — OLMESARTAN MEDOXOMIL 40 MG PO TABS
40.0000 mg | ORAL_TABLET | Freq: Every day | ORAL | 3 refills | Status: DC
  Filled 2023-12-13 – 2024-01-07 (×2): qty 90, 90d supply, fill #0
  Filled 2024-04-13: qty 90, 90d supply, fill #1
  Filled 2024-07-16: qty 90, 90d supply, fill #2
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
## 2023-12-13 NOTE — Patient Instructions (Signed)
 Increase olmesartan 40, can double up on what you have at home

## 2023-12-17 ENCOUNTER — Encounter: Payer: Self-pay | Admitting: Pediatrics

## 2023-12-17 NOTE — Assessment & Plan Note (Signed)
 Episodes of elevated blood pressure under stress. Increased olmesartan dosage for better management. - Increase olmesartan dosage. - Double up on current olmesartan supply until new prescription is filled.

## 2023-12-17 NOTE — Assessment & Plan Note (Signed)
 Worsening headaches above age 68, unresponsive to Tylenol, with concern for neurological issues. MRI pending. Nurtec provided for interim relief.  - Provide Nurtec for headache relief. - Order MRI of the brain.

## 2023-12-17 NOTE — Assessment & Plan Note (Signed)
 Informed about increase in thyroid medication dosage to 100 mcg. - Ensure she is taking the updated thyroid medication dosage.

## 2023-12-19 ENCOUNTER — Ambulatory Visit
Admission: RE | Admit: 2023-12-19 | Discharge: 2023-12-19 | Disposition: A | Discharge status: Home / Self Care | Source: Ambulatory Visit | Attending: Pediatrics | Admitting: Pediatrics

## 2023-12-19 ENCOUNTER — Ambulatory Visit: Payer: Self-pay

## 2023-12-19 DIAGNOSIS — J320 Chronic maxillary sinusitis: Secondary | ICD-10-CM | POA: Diagnosis not present

## 2023-12-19 DIAGNOSIS — R519 Headache, unspecified: Secondary | ICD-10-CM | POA: Insufficient documentation

## 2023-12-19 NOTE — Patient Instructions (Signed)
 Visit Information  Thank you for taking time to visit with me today. Please don't hesitate to contact me if I can be of assistance to you.   Following are the goals we discussed today:  Patient to contact [employer] to confirm balance, LIEAP payment and if at risk of disconnection.   Our next appointment is by telephone on 12/24/23 at 2pm  Please call the care guide team at [phone] if you need to cancel or reschedule your appointment.   If you are experiencing a Mental Health or Behavioral Health Crisis or need someone to talk to, please call 911  Patient verbalizes understanding of instructions and care plan provided today and agrees to view in MyChart. Active MyChart status and patient understanding of how to access instructions and care plan via MyChart confirmed with patient.     Telephone follow up appointment with care management team member scheduled for:12/24/23 at 2pm  Lysle Morales, BSW [place]  [facility], [facility] Social Worker Direct Dial: [phone]  Fax: [phone] Website: [email or web address]

## 2023-12-19 NOTE — Patient Outreach (Signed)
 Care Coordination   Initial Visit Note   12/19/2023 Name: Amy Knapp MRN: [id number] DOB: Jul 18, 1956  Amy Knapp is a 68 y.o. year old female who sees Evelene Croon, Atilano Median, MD for primary care. I spoke with  Lorin Picket by phone today.  What matters to the patients health and wellness today?  Patient needs information on housing and power bill.  Patient works and receives [employer]. Rent increases to $750 in June. Patient is familiar with food banks.  Patient has applied for income based housing and is on the waiting list.     Goals Addressed             This Visit's Progress    Care Coordination Activites       Interventions Today    Flowsheet Row Most Recent Value  Chronic Disease   Chronic disease during today's visit Diabetes, Chronic Obstructive Pulmonary Disease (COPD), Hypertension (HTN)  General Interventions   General Interventions Discussed/Reviewed General Interventions Discussed, General Interventions Reviewed  [Pt was approved for energy assistance from DSS but did not continue to pay the bill and now owes a balance.Pt will contact [employer] to confirm payment received,balance owed/if disconnection scheduled.Pt rent increased $50 in june but still in budget.]  Education Interventions   Education Provided Provided Education  [Educated patient on the LIEAP payment from DSS.]              SDOH assessments and interventions completed:  Yes  SDOH Interventions Today    Flowsheet Row Most Recent Value  SDOH Interventions   Food Insecurity Interventions Other (Comment)  [Will re-apply for Foodstamps, familiar with food banks]  Housing Interventions Intervention Not Indicated  Transportation Interventions Intervention Not Indicated  [Has a car]  Utilities Interventions Other (Comment)  [Contact [employer] to pay balance]        Care Coordination Interventions:  Yes, provided   Follow up plan: Follow up call scheduled for  12/24/23 at 2pm    Encounter Outcome:  Patient Visit Completed

## 2023-12-24 ENCOUNTER — Ambulatory Visit: Payer: Self-pay

## 2023-12-24 ENCOUNTER — Other Ambulatory Visit: Payer: Self-pay | Admitting: Pediatrics

## 2023-12-24 DIAGNOSIS — J341 Cyst and mucocele of nose and nasal sinus: Secondary | ICD-10-CM

## 2023-12-24 NOTE — Patient Outreach (Signed)
 Care Coordination   Follow Up Visit Note   12/24/2023 Name: Samyukta Cura MRN: [id number] DOB: 1956/01/24  Yena Tisby is a 68 y.o. year old female who sees Evelene Croon, Atilano Median, MD for primary care. I spoke with  Lorin Picket by phone today.  What matters to the patients health and wellness today?  Patient was able to confirm assistance toward power bill was applied and bill is paid in full.  Patient reports no other unmet needs.    Goals Addressed             This Visit's Progress    Care Coordination Activites       Interventions Today    Flowsheet Row Most Recent Value  Chronic Disease   Chronic disease during today's visit Diabetes, Chronic Obstructive Pulmonary Disease (COPD), Hypertension (HTN)  General Interventions   General Interventions Discussed/Reviewed General Interventions Discussed, General Interventions Reviewed  [Pt contacted [employer] and the LIEAP payment was applied ($8 credit).Pt is paid until April bill.]              SDOH assessments and interventions completed:  No     Care Coordination Interventions:  Yes, provided   Follow up plan: No further intervention required.   Encounter Outcome:  Patient Visit Completed

## 2023-12-24 NOTE — Patient Instructions (Signed)
 Visit Information  Thank you for taking time to visit with me today. Please don't hesitate to contact me if I can be of assistance to you.   Following are the goals we discussed today:  Goal of achieved. Power bill was paid.    If you are experiencing a Mental Health or Behavioral Health Crisis or need someone to talk to, please call 911  Patient verbalizes understanding of instructions and care plan provided today and agrees to view in MyChart. Active MyChart status and patient understanding of how to access instructions and care plan via MyChart confirmed with patient.     No further follow up required: Patient does not require an additional visit.  Lysle Morales, BSW   [facility], [place] Social Worker Direct Dial: [phone]  Fax: [phone] Website: [email or web address]

## 2023-12-24 NOTE — Progress Notes (Signed)
 Placing referral for ENT. Mucous retention cyst within maxillary sinus. Unclear if this would cause/explain headaches.  Jackolyn Confer, MD

## 2023-12-25 ENCOUNTER — Other Ambulatory Visit: Payer: Self-pay

## 2024-01-06 ENCOUNTER — Other Ambulatory Visit: Payer: Self-pay

## 2024-01-07 ENCOUNTER — Other Ambulatory Visit: Payer: Self-pay

## 2024-01-09 ENCOUNTER — Other Ambulatory Visit: Payer: Self-pay

## 2024-01-09 ENCOUNTER — Telehealth: Payer: Self-pay

## 2024-01-09 NOTE — Telephone Encounter (Signed)
 Copied from CRM [id number]. Topic: Clinical - Prescription Issue >> Jan 09, 2024 10:00 AM Fuller Mandril wrote: Reason for CRM: Vincenza Hews called from [facility] health to see if we have another number listed for patient. We only have the one they confirmed. She would like to know if someone could assist them with reaching out to the patient for medication adherence for olmesartan (BENICAR) 40 MG tablet and remind patient about medication. No callback requested. Thank You

## 2024-01-09 NOTE — Telephone Encounter (Signed)
 Since we also no other contact information then we are unable to offer any assistance in reaching the patient, aside from the voicemail I left for her.

## 2024-01-16 ENCOUNTER — Encounter: Payer: Self-pay | Admitting: Pediatrics

## 2024-01-16 ENCOUNTER — Ambulatory Visit: Admitting: Pediatrics

## 2024-01-16 VITALS — BP 107/70 | HR 69 | Temp 98.0°F | Wt 149.2 lb

## 2024-01-16 DIAGNOSIS — E1140 Type 2 diabetes mellitus with diabetic neuropathy, unspecified: Secondary | ICD-10-CM

## 2024-01-16 DIAGNOSIS — J449 Chronic obstructive pulmonary disease, unspecified: Secondary | ICD-10-CM | POA: Diagnosis not present

## 2024-01-16 DIAGNOSIS — E039 Hypothyroidism, unspecified: Secondary | ICD-10-CM

## 2024-01-16 DIAGNOSIS — I10 Essential (primary) hypertension: Secondary | ICD-10-CM | POA: Diagnosis not present

## 2024-01-16 LAB — MICROALBUMIN, URINE WAIVED
Creatinine, Urine Waived: 200 mg/dL (ref 10–300)
Microalb, Ur Waived: 30 mg/L — ABNORMAL HIGH (ref 0–19)
Microalb/Creat Ratio: <30 mg/g (ref <30)

## 2024-01-16 NOTE — Progress Notes (Signed)
 Office Visit  BP 107/70   Pulse 69   Temp 98 F (36.7 C) (Oral)   Wt 149 lb 3.2 oz (67.7 kg)   SpO2 95%   BMI 27.29 kg/m    Subjective:    Patient ID: Lorin Picket, female    DOB: 22-Mar-1956, 68 y.o.   MRN: [id number]  HPI: Shaquayla Klimas is a 68 y.o. female  Chief Complaint  Patient presents with   Hypertension    #HTN No chest pain, sob, palpations, orthopnea, dyspnea, PND, lower extremity edema. Taking benicar 40mg  tolerating well  #hypothyroidism Recently increased dose of synthroid Due for repeat  #night time wheeze She continues to have night time wheezing, uses prn albuterol  Relevant past medical, surgical, family and social history reviewed and updated as indicated. Interim medical history since our last visit reviewed. Allergies and medications reviewed and updated.  ROS per HPI unless specifically indicated above     Objective:    BP 107/70   Pulse 69   Temp 98 F (36.7 C) (Oral)   Wt 149 lb 3.2 oz (67.7 kg)   SpO2 95%   BMI 27.29 kg/m   Wt Readings from Last 3 Encounters:  01/16/24 149 lb 3.2 oz (67.7 kg)  12/13/23 150 lb 12.8 oz (68.4 kg)  11/29/23 148 lb 12.8 oz (67.5 kg)     Physical Exam Constitutional:      Appearance: Normal appearance.  HENT:     Head: Normocephalic and atraumatic.  Eyes:     Pupils: Pupils are equal, round, and reactive to light.  Cardiovascular:     Rate and Rhythm: Normal rate and regular rhythm.     Pulses: Normal pulses.     Heart sounds: Normal heart sounds.  Pulmonary:     Effort: Pulmonary effort is normal. No respiratory distress.     Breath sounds: Normal breath sounds. No wheezing or rales.  Musculoskeletal:        General: Normal range of motion.     Cervical back: Normal range of motion.  Skin:    General: Skin is warm and dry.     Capillary Refill: Capillary refill takes less than 2 seconds.  Neurological:     General: No focal deficit present.     Mental Status: She  is alert. Mental status is at baseline.  Psychiatric:        Mood and Affect: Mood normal.        Behavior: Behavior normal.         01/16/2024    8:18 AM 12/13/2023    4:13 PM 11/29/2023    1:47 PM 07/30/2023   12:52 PM 04/25/2023    3:44 PM  Depression screen PHQ 2/9  Decreased Interest 0 0 0 0 0  Down, Depressed, Hopeless 0 0 0 0 0  PHQ - 2 Score 0 0 0 0 0  Altered sleeping 0 1 3  1   Tired, decreased energy 1 3 2  1   Change in appetite 2 3 3  2   Feeling bad or failure about yourself  0 0 0  0  Trouble concentrating 0 0 0  0  Moving slowly or fidgety/restless 0 0 0  0  Suicidal thoughts 0 0 0  0  PHQ-9 Score 3 7 8  4   Difficult doing work/chores Not difficult at all  Somewhat difficult         01/16/2024    8:18 AM 12/13/2023    4:13 PM  11/29/2023    1:48 PM 03/14/2023    3:57 PM  GAD 7 : Generalized Anxiety Score  Nervous, Anxious, on Edge 0 1 1 3   Control/stop worrying 1 3 3 1   Worry too much - different things 1 3 3  0  Trouble relaxing 0 1 3 2   Restless 0 0 1 2  Easily annoyed or irritable 0 0 3 2  Afraid - awful might happen 1 3 3  0  Total GAD 7 Score 3 11 17 10   Anxiety Difficulty Not difficult at all  Somewhat difficult Not difficult at all       Assessment & Plan:  Assessment & Plan   Hypothyroidism, unspecified type Assessment & Plan: Recently increased synthroid dose to . Repeat levels today.  Orders: -     Thyroid Panel With TSH  Primary hypertension Assessment & Plan: Well controlled asymptomatic on olmesartan. Continue current regimen.   Type 2 diabetes mellitus with diabetic neuropathy, without long-term current use of insulin (HCC) Assessment & Plan: Due for UACR today. A1c at goal.  Orders: -     Microalbumin, Urine Waived  Chronic obstructive pulmonary disease, unspecified COPD type (HCC) Assessment & Plan: Given breztri BID sample to try for breakthrough night time sx. VS wnl and no wheezing on exam. Will call patient next week to  see how it is working     Follow up plan: Return in about 2 months (around 03/17/2024) for Physical.  Jackolyn Confer, MD

## 2024-01-16 NOTE — Assessment & Plan Note (Signed)
 Well controlled asymptomatic on olmesartan. Continue current regimen.

## 2024-01-16 NOTE — Assessment & Plan Note (Signed)
 Recently increased synthroid dose to . Repeat levels today.

## 2024-01-16 NOTE — Assessment & Plan Note (Signed)
 Due for UACR today. A1c at goal.

## 2024-01-16 NOTE — Assessment & Plan Note (Signed)
 Given breztri BID sample to try for breakthrough night time sx. VS wnl and no wheezing on exam. Will call patient next week to see how it is working

## 2024-01-16 NOTE — Patient Instructions (Addendum)
 Plan to repeat thyroid levels In 2 months we will do the medicare annual

## 2024-01-17 LAB — THYROID PANEL WITH TSH
Free Thyroxine Index: 2.7 (ref 1.2–4.9)
T3 Uptake Ratio: 27 % (ref 24–39)
T4, Total: 10.1 ug/dL (ref 4.5–12.0)
TSH: 0.787 u[IU]/mL (ref 0.450–4.500)

## 2024-01-21 ENCOUNTER — Ambulatory Visit: Admitting: Pediatrics

## 2024-01-24 ENCOUNTER — Other Ambulatory Visit: Payer: Self-pay

## 2024-01-24 ENCOUNTER — Encounter: Payer: Self-pay | Admitting: Pediatrics

## 2024-01-24 ENCOUNTER — Ambulatory Visit (INDEPENDENT_AMBULATORY_CARE_PROVIDER_SITE_OTHER): Admitting: Pediatrics

## 2024-01-24 VITALS — BP 146/75 | HR 70 | Temp 97.9°F | Wt 150.2 lb

## 2024-01-24 DIAGNOSIS — J449 Chronic obstructive pulmonary disease, unspecified: Secondary | ICD-10-CM | POA: Diagnosis not present

## 2024-01-24 DIAGNOSIS — R159 Full incontinence of feces: Secondary | ICD-10-CM | POA: Diagnosis not present

## 2024-01-24 MED ORDER — LOPERAMIDE HCL 2 MG PO TABS
2.0000 mg | ORAL_TABLET | Freq: Every evening | ORAL | 0 refills | Status: DC | PRN
  Filled 2024-01-24: qty 24, 24d supply, fill #0

## 2024-01-24 MED ORDER — PSYLLIUM 51.7 % PO POWD
1.0000 | Freq: Three times a day (TID) | ORAL | 12 refills | Status: DC
Start: 2024-01-24 — End: 2024-01-24
  Filled 2024-01-24: qty 283, 30d supply, fill #0

## 2024-01-24 MED ORDER — CALCIUM POLYCARBOPHIL 625 MG PO TABS
625.0000 mg | ORAL_TABLET | Freq: Every day | ORAL | 1 refills | Status: DC
  Filled 2024-01-24: qty 90, 90d supply, fill #0

## 2024-01-24 NOTE — Patient Instructions (Addendum)
 For the incontinence, take fiber supplements.  At night take loperamide  before bed and see.   Will call you with lab results, I placed the specialist consult.

## 2024-01-24 NOTE — Progress Notes (Signed)
 Office Visit  BP (!) 146/75   Pulse 70   Temp 97.9 F (36.6 C) (Oral)   Wt 150 lb 3.2 oz (68.1 kg)   SpO2 94%   BMI 27.47 kg/m    Subjective:    Patient ID: Amy Knapp, female    DOB: 29-May-1956, 68 y.o.   MRN: [id number]  HPI: Amy Knapp is a 68 y.o. female  Chief Complaint  Patient presents with   Diarrhea    Symptom have been going on for almost a month    Discussed the use of AI scribe software for clinical note transcription with the patient, who gave verbal consent to proceed.  History of Present Illness   Amy Knapp is a 68 year old female who presents with nocturnal bowel incontinence and abdominal bloating.  She has been experiencing bowel incontinence primarily at night for the past month. The stool is sometimes dark brown and occasionally green, with no blood present. The incontinence occurs every other night. She has not taken any antibiotics recently nor has she traveled. A colonoscopy in 2022 was normal.  She experiences bloating and abdominal pain, particularly after eating. The pain is localized to the lower abdomen and is accompanied by significant bloating. She avoids greasy foods and large portions to mitigate symptoms. Eating certain foods like nuts can exacerbate her symptoms, leading to discomfort in the chest area. She has reduced her intake of nuts, particularly cashews, due to these symptoms.  She has a history of thyroid  issues, which are currently controlled. She also experiences sharp chest pain when taking deep breaths, which she attributes to recent coughing episodes. No recent changes in her medication regimen except for the use of Breztri, which is working well for her COPD.  In terms of her diet, she consumes raisin bread and whole wheat bread, which she believes contain fiber. She occasionally eats peanut butter sandwiches and has reduced her nut consumption due to previous discomfort. She has not been  taking any fiber supplements or medications like Imodium  prior to this visit.        Relevant past medical, surgical, family and social history reviewed and updated as indicated. Interim medical history since our last visit reviewed. Allergies and medications reviewed and updated.  ROS per HPI unless specifically indicated above     Objective:    BP (!) 146/75   Pulse 70   Temp 97.9 F (36.6 C) (Oral)   Wt 150 lb 3.2 oz (68.1 kg)   SpO2 94%   BMI 27.47 kg/m   Wt Readings from Last 3 Encounters:  01/24/24 150 lb 3.2 oz (68.1 kg)  01/16/24 149 lb 3.2 oz (67.7 kg)  12/13/23 150 lb 12.8 oz (68.4 kg)     Physical Exam Constitutional:      Appearance: Normal appearance.  HENT:     Head: Normocephalic and atraumatic.  Eyes:     Pupils: Pupils are equal, round, and reactive to light.  Cardiovascular:     Rate and Rhythm: Normal rate and regular rhythm.     Pulses: Normal pulses.     Heart sounds: Normal heart sounds.  Pulmonary:     Effort: Pulmonary effort is normal.     Breath sounds: Normal breath sounds.  Abdominal:     General: Abdomen is flat. Bowel sounds are normal. There is no distension.     Palpations: Abdomen is soft.     Tenderness: There is no abdominal tenderness.     Hernia: No  hernia is present.  Musculoskeletal:        General: Normal range of motion.     Cervical back: Normal range of motion.  Skin:    General: Skin is warm and dry.     Capillary Refill: Capillary refill takes less than 2 seconds.  Neurological:     General: No focal deficit present.     Mental Status: She is alert. Mental status is at baseline.  Psychiatric:        Mood and Affect: Mood normal.        Behavior: Behavior normal.         01/24/2024    2:48 PM 01/16/2024    8:18 AM 12/13/2023    4:13 PM 11/29/2023    1:47 PM 07/30/2023   12:52 PM  Depression screen PHQ 2/9  Decreased Interest 0 0 0 0 0  Down, Depressed, Hopeless 0 0 0 0 0  PHQ - 2 Score 0 0 0 0 0  Altered  sleeping 0 0 1 3   Tired, decreased energy 0 1 3 2    Change in appetite 0 2 3 3    Feeling bad or failure about yourself  0 0 0 0   Trouble concentrating 0 0 0 0   Moving slowly or fidgety/restless 0 0 0 0   Suicidal thoughts 0 0 0 0   PHQ-9 Score 0 3 7 8    Difficult doing work/chores Not difficult at all Not difficult at all  Somewhat difficult        01/24/2024    2:49 PM 01/16/2024    8:18 AM 12/13/2023    4:13 PM 11/29/2023    1:48 PM  GAD 7 : Generalized Anxiety Score  Nervous, Anxious, on Edge 0 0 1 1  Control/stop worrying 0 1 3 3   Worry too much - different things 0 1 3 3   Trouble relaxing 0 0 1 3  Restless 0 0 0 1  Easily annoyed or irritable 0 0 0 3  Afraid - awful might happen 0 1 3 3   Total GAD 7 Score 0 3 11 17   Anxiety Difficulty Not difficult at all Not difficult at all  Somewhat difficult       Assessment & Plan:  Assessment & Plan   Full incontinence of feces Nocturnal diarrhea with unclear etiology. Normal thyroid  levels. Possible inadequate fiber intake or gastrointestinal issue. Postprandial abdominal pain and bloating, possibly dietary-related. Normal colonoscopy and endoscopy. - Recommend over-the-counter fiber supplements. - Advise avoidance of symptom-exacerbating foods. - Referral to gastroenterologist recommended. - Prescribe Imodium  at night before bed. - Recommend daily over-the-counter fiber supplements. - Refer to gastroenterologist for evaluation. - Order liver enzyme and pancreas tests. -     Loperamide  HCl; Take 1 tablet (2 mg total) by mouth at bedtime as needed for diarrhea or loose stools.  Dispense: 30 tablet; Refill: 0 -     Comprehensive metabolic panel with GFR -     Lipase -     Ambulatory referral to Gastroenterology -     CBC with Differential/Platelet -     Calcium  Polycarbophil; Take 1 tablet (625 mg total) by mouth daily.  Dispense: 90 tablet; Refill: 1  Chronic obstructive pulmonary disease, unspecified COPD type  (HCC) Assessment & Plan: COPD managed with Breztri. Plans for Medicaid or Medicare coverage in place. - Continue Breztri as prescribed. - Submit prior authorization if required.  Follow up plan: No follow-ups on file.  Hadassah Letters, MD

## 2024-01-25 LAB — CBC WITH DIFFERENTIAL/PLATELET
Basophils Absolute: 0 10*3/uL (ref 0.0–0.2)
Basos: 1 %
EOS (ABSOLUTE): 0.1 10*3/uL (ref 0.0–0.4)
Eos: 1 %
Hematocrit: 40.5 % (ref 34.0–46.6)
Hemoglobin: 13.5 g/dL (ref 11.1–15.9)
Immature Grans (Abs): 0 10*3/uL (ref 0.0–0.1)
Immature Granulocytes: 0 %
Lymphocytes Absolute: 2.1 10*3/uL (ref 0.7–3.1)
Lymphs: 40 %
MCH: 30.4 pg (ref 26.6–33.0)
MCHC: 33.3 g/dL (ref 31.5–35.7)
MCV: 91 fL (ref 79–97)
Monocytes Absolute: 0.5 10*3/uL (ref 0.1–0.9)
Monocytes: 9 %
Neutrophils Absolute: 2.4 10*3/uL (ref 1.4–7.0)
Neutrophils: 49 %
Platelets: 255 10*3/uL (ref 150–450)
RBC: 4.44 x10E6/uL (ref 3.77–5.28)
RDW: 12.1 % (ref 11.7–15.4)
WBC: 5.1 10*3/uL (ref 3.4–10.8)

## 2024-01-25 LAB — COMPREHENSIVE METABOLIC PANEL WITH GFR
ALT: 12 IU/L (ref 0–32)
AST: 17 IU/L (ref 0–40)
Albumin: 4.3 g/dL (ref 3.9–4.9)
Alkaline Phosphatase: 86 IU/L (ref 44–121)
BUN/Creatinine Ratio: 13 (ref 12–28)
BUN: 10 mg/dL (ref 8–27)
Bilirubin Total: 0.3 mg/dL (ref 0.0–1.2)
CO2: 24 mmol/L (ref 20–29)
Calcium: 9.8 mg/dL (ref 8.7–10.3)
Chloride: 103 mmol/L (ref 96–106)
Creatinine, Ser: 0.79 mg/dL (ref 0.57–1.00)
Globulin, Total: 2.3 g/dL (ref 1.5–4.5)
Glucose: 116 mg/dL — ABNORMAL HIGH (ref 70–99)
Potassium: 4.2 mmol/L (ref 3.5–5.2)
Sodium: 142 mmol/L (ref 134–144)
Total Protein: 6.6 g/dL (ref 6.0–8.5)
eGFR: 83 mL/min/{1.73_m2} (ref >59)

## 2024-01-25 LAB — LIPASE: Lipase: 25 U/L (ref 14–72)

## 2024-01-29 ENCOUNTER — Other Ambulatory Visit: Payer: Self-pay

## 2024-02-05 ENCOUNTER — Encounter: Payer: Self-pay | Admitting: Pediatrics

## 2024-02-05 NOTE — Assessment & Plan Note (Signed)
 COPD managed with Breztri. Plans for Medicaid or Medicare coverage in place. - Continue Breztri as prescribed. - Submit prior authorization if required.

## 2024-02-12 ENCOUNTER — Other Ambulatory Visit: Payer: Self-pay

## 2024-03-04 ENCOUNTER — Institutional Professional Consult (permissible substitution) (INDEPENDENT_AMBULATORY_CARE_PROVIDER_SITE_OTHER): Admitting: Otolaryngology

## 2024-03-04 ENCOUNTER — Other Ambulatory Visit: Payer: Self-pay

## 2024-03-16 ENCOUNTER — Other Ambulatory Visit: Payer: Self-pay | Admitting: Pediatrics

## 2024-03-16 ENCOUNTER — Other Ambulatory Visit: Payer: Self-pay

## 2024-03-16 DIAGNOSIS — G4700 Insomnia, unspecified: Secondary | ICD-10-CM

## 2024-03-17 ENCOUNTER — Other Ambulatory Visit: Payer: Self-pay

## 2024-03-17 MED FILL — Trazodone HCl Tab 50 MG: ORAL | 30 days supply | Qty: 90 | Fill #0 | Status: CN

## 2024-03-17 NOTE — Telephone Encounter (Signed)
 Requested medications are due for refill today.  yes  Requested medications are on the active medications list.  yes  Last refill. 11/29/2023 #90 2 rf  Future visit scheduled.   yes  Notes to clinic.  Rx written to expire 03/15/2024 - rx is expired.    Requested Prescriptions  Pending Prescriptions Disp Refills   traZODone  (DESYREL ) 50 MG tablet 90 tablet 2    Sig: Take 3 tablets (150 mg total) by mouth once daily at bedtime.     Psychiatry: Antidepressants - Serotonin Modulator Passed - 03/17/2024 11:24 AM      Passed - Valid encounter within last 6 months    Recent Outpatient Visits           1 month ago Full incontinence of feces   [facility] Hadassah Letters, MD   2 months ago Hypothyroidism, unspecified type   [place] [facility] Hadassah Letters, MD   3 months ago Nonintractable headache, unspecified chronicity pattern, unspecified headache type   [place] [facility] Hadassah Letters, MD   3 months ago Primary hypertension    [facility] Hadassah Letters, MD

## 2024-03-18 ENCOUNTER — Other Ambulatory Visit: Payer: Self-pay

## 2024-03-18 ENCOUNTER — Ambulatory Visit (INDEPENDENT_AMBULATORY_CARE_PROVIDER_SITE_OTHER): Admitting: Pediatrics

## 2024-03-18 ENCOUNTER — Encounter: Payer: Self-pay | Admitting: Pediatrics

## 2024-03-18 VITALS — BP 158/84 | HR 60 | Temp 98.2°F | Ht 63.0 in | Wt 146.6 lb

## 2024-03-18 DIAGNOSIS — Z13820 Encounter for screening for osteoporosis: Secondary | ICD-10-CM | POA: Diagnosis not present

## 2024-03-18 DIAGNOSIS — E1140 Type 2 diabetes mellitus with diabetic neuropathy, unspecified: Secondary | ICD-10-CM | POA: Diagnosis not present

## 2024-03-18 DIAGNOSIS — Z0000 Encounter for general adult medical examination without abnormal findings: Secondary | ICD-10-CM | POA: Diagnosis not present

## 2024-03-18 DIAGNOSIS — Z122 Encounter for screening for malignant neoplasm of respiratory organs: Secondary | ICD-10-CM | POA: Diagnosis not present

## 2024-03-18 DIAGNOSIS — Z7189 Other specified counseling: Secondary | ICD-10-CM | POA: Diagnosis not present

## 2024-03-18 DIAGNOSIS — Z1159 Encounter for screening for other viral diseases: Secondary | ICD-10-CM | POA: Diagnosis not present

## 2024-03-18 DIAGNOSIS — Z1231 Encounter for screening mammogram for malignant neoplasm of breast: Secondary | ICD-10-CM

## 2024-03-18 DIAGNOSIS — G4700 Insomnia, unspecified: Secondary | ICD-10-CM | POA: Diagnosis not present

## 2024-03-18 DIAGNOSIS — Z23 Encounter for immunization: Secondary | ICD-10-CM | POA: Diagnosis not present

## 2024-03-18 DIAGNOSIS — I10 Essential (primary) hypertension: Secondary | ICD-10-CM

## 2024-03-18 MED ORDER — AMLODIPINE BESYLATE 5 MG PO TABS
5.0000 mg | ORAL_TABLET | Freq: Every day | ORAL | 3 refills | Status: DC
  Filled 2024-03-18: qty 30, 30d supply, fill #0
  Filled 2024-04-13: qty 30, 30d supply, fill #1
  Filled 2024-05-21: qty 30, 30d supply, fill #2
  Filled 2024-06-24: qty 30, 30d supply, fill #3

## 2024-03-18 MED ORDER — TRAZODONE HCL 150 MG PO TABS
150.0000 mg | ORAL_TABLET | Freq: Every day | ORAL | 2 refills | Status: AC
  Filled 2024-03-18: qty 180, 90d supply, fill #0
  Filled 2024-07-13: qty 180, 90d supply, fill #1
  Filled 2024-10-14: qty 180, 90d supply, fill #2

## 2024-03-18 NOTE — Progress Notes (Signed)
 Subjective:    Amy Knapp is a 68 y.o. female who presents for a Welcome to Medicare exam.          Objective:    Today's Vitals   03/18/24 0808 03/18/24 0817  BP: (!) 151/84 (!) 158/84  Pulse: 63 60  Temp: 98.2 F (36.8 C)   TempSrc: Oral   SpO2: 93%   Weight: 146 lb 9.6 oz (66.5 kg)   Height: 5' 3 (1.6 m)   PainSc: 0-No pain   Body mass index is 25.97 kg/m.  Medications Outpatient Encounter Medications as of 03/18/2024  Medication Sig   albuterol  (PROVENTIL  HFA) 108 (90 Base) MCG/ACT inhaler Inhale 2 puffs into the lungs once every 6 (six) hours as needed for wheezing or shortness of breath.   allopurinol  (ZYLOPRIM ) 100 MG tablet Take 1 tablet (100 mg total) by mouth daily.   amLODipine (NORVASC) 5 MG tablet Take 1 tablet (5 mg total) by mouth daily.   atorvastatin  (LIPITOR) 10 MG tablet Take 1 tablet (10 mg total) by mouth daily.   Blood Pressure KIT 1 kit by Does not apply route daily.   fluticasone -salmeterol (WIXELA INHUB ) 250-50 MCG/ACT AEPB Inhale 1 puff into the lungs in the morning and at bedtime.   levothyroxine  (SYNTHROID ) 100 MCG tablet Take 1 tablet (100 mcg total) by mouth daily before breakfast.   loperamide  (IMODIUM  A-D) 2 MG tablet Take 1 tablet (2 mg total) by mouth at bedtime as needed for diarrhea or loose stools.   montelukast  (SINGULAIR ) 10 MG tablet Take 1 tablet (10 mg total) by mouth at bedtime.   olmesartan  (BENICAR ) 40 MG tablet Take 1 tablet (40 mg total) by mouth daily.   polycarbophil (FIBERCON) 625 MG tablet Take 1 tablet (625 mg total) by mouth daily.   Spacer/Aero-Holding Chambers (COMPACT SPACE CHAMBER) DEVI Use with albuterol    [DISCONTINUED] traZODone  (DESYREL ) 50 MG tablet Take 3 tablets (150 mg total) by mouth once daily at bedtime.   gabapentin  (NEURONTIN ) 400 MG capsule Take 1 capsule (400 mg total) by mouth 3 (three) times daily.   traZODone  (DESYREL ) 150 MG tablet Take 1-2 tablets (150-300 mg total) by mouth at  bedtime.   No facility-administered encounter medications on file as of 03/18/2024.     History: Past Medical History:  Diagnosis Date   Allergy    Anxiety    Arthritis    Asthma    Broken leg 2007   Right   Cataract    Complex renal cyst 06/24/2021   COPD (chronic obstructive pulmonary disease) (HCC)    Depression    Diabetes mellitus without complication (HCC)    Emphysema of lung (HCC)    Essential hypertension 04/20/2021   GERD (gastroesophageal reflux disease)    Gout    History of hiatal hernia    Hyperlipidemia    Hypothyroidism    Sleep apnea    Thyroid  disease    Past Surgical History:  Procedure Laterality Date   ABDOMINAL HYSTERECTOMY  2000   Partial   COLONOSCOPY WITH PROPOFOL  N/A 04/11/2021   Procedure: COLONOSCOPY WITH PROPOFOL ;  Surgeon: Marnee Sink, MD;  Location: [facility] ENDOSCOPY;  Service: Endoscopy;  Laterality: N/A;   ESOPHAGOGASTRODUODENOSCOPY N/A 04/11/2021   Procedure: ESOPHAGOGASTRODUODENOSCOPY (EGD);  Surgeon: Marnee Sink, MD;  Location: [facility] ENDOSCOPY;  Service: Endoscopy;  Laterality: N/A;   HERNIA REPAIR     INSERTION OF MESH  08/29/2021   Procedure: INSERTION OF MESH;  Surgeon: Alben Alma, MD;  Location: [facility]  ORS;  Service: General;;   LEG SURGERY Right 10/08/2005   broken right lower leg; plate and screws   PATELLA FRACTURE SURGERY Right    TUBAL LIGATION     XI ROBOTIC ASSISTED HIATAL HERNIA REPAIR N/A 08/29/2021   Procedure: XI ROBOTIC ASSISTED HIATAL HERNIA REPAIR, RNFA to assist;  Surgeon: Alben Alma, MD;  Location: [facility] ORS;  Service: General;  Laterality: N/A;    Family History  Problem Relation Age of Onset   Cancer Mother    Heart attack Father    Cancer Brother        colon   Heart attack Brother 2   Diabetes Daughter    Diabetes Son    Hypertension Maternal Aunt    Breast cancer Neg Hx    Social History   Occupational History   Occupation: Unemployed  Tobacco Use   Smoking status: Former    Current  packs/day: 0.00    Types: Cigarettes    Quit date: 04/09/2021    Years since quitting: 2.9   Smokeless tobacco: Former    Quit date: 1973   Tobacco comments:    Smoking 2 cigarettes/day    Patient dipped snuff and used smokeless tobacco when she was very young. Patient stopped at age 61.    Pack last 3 weeks        Quit a month ago. Feels good with quitting.   Vaping Use   Vaping status: Never Used  Substance and Sexual Activity   Alcohol use: No   Drug use: No   Sexual activity: Not Currently    Tobacco Counseling Counseling given: Not Answered Tobacco comments: Smoking 2 cigarettes/day Patient dipped snuff and used smokeless tobacco when she was very young. Patient stopped at age 69. Pack last 3 weeks  Quit a month ago. Feels good with quitting.    Immunizations and Health Maintenance Immunization History  Administered Date(s) Administered   Influenza Inj Mdck Quad Pf 11/13/2016   Influenza,inj,Quad PF,6+ Mos 07/16/2019   Health Maintenance Due  Topic Date Due   OPHTHALMOLOGY EXAM  Never done   Hepatitis C Screening  Never done   MAMMOGRAM  08/24/2023   DEXA SCAN  Never done   Lung Cancer Screening  03/24/2024    Activities of Daily Living    03/18/2024    8:11 AM  In your present state of health, do you have any difficulty performing the following activities:  Hearing? 0  Vision? 0  Difficulty concentrating or making decisions? 0  Walking or climbing stairs? 0  Dressing or bathing? 0  Doing errands, shopping? 0  Preparing Food and eating ? N  Using the Toilet? N  In the past six months, have you accidently leaked urine? N  Do you have problems with loss of bowel control? Y  Managing your Medications? N  Managing your Finances? N    Physical Exam   Physical Exam (optional), or other factors deemed appropriate based on the beneficiary's medical and social history and current clinical standards.   Advanced Directives: Does Patient Have a Medical  Advance Directive?: No Would patient like information on creating a medical advance directive?: Yes (MAU/Ambulatory/Procedural Areas - Information given)  EKG:  normal EKG, normal sinus rhythm, unchanged from previous tracings      Assessment:    This is a routine wellness examination for this patient .   Vision/Hearing screen Hearing Screening   500Hz  1000Hz  2000Hz  4000Hz   Right ear 40 40 40 40  Left  ear 40 40 40 40   Vision Screening   Right eye Left eye Both eyes  Without correction     With correction 20/20 20/20      Goals       DIET - INCREASE WATER INTAKE (pt-stated)       Depression Screen    03/18/2024    8:15 AM 01/24/2024    2:48 PM 01/16/2024    8:18 AM 12/13/2023    4:13 PM  PHQ 2/9 Scores  PHQ - 2 Score 1 0 0 0  PHQ- 9 Score 8 0 3 7     Fall Risk    03/18/2024    8:15 AM  Fall Risk   Number falls in past yr: 0  Injury with Fall? 0  Risk for fall due to : No Fall Risks  Follow up Falls evaluation completed    Cognitive Function:        03/18/2024    8:09 AM  6CIT Screen  What Year? 0 points  What month? 0 points  What time? 0 points  Count back from 20 0 points  Months in reverse 0 points  Repeat phrase 0 points  Total Score 0 points    Patient Care Team: Hadassah Letters, MD as PCP - General (Family Medicine) Burnie Cartwright, RN as Registered Nurse Arlette Benders, RN (Inactive) as Registered Nurse     Plan:     I have personally reviewed and noted the following in the patient's chart:   Medical and social history Use of alcohol, tobacco or illicit drugs  Current medications and supplements including opioid prescriptions. Patient is not currently taking opioid prescriptions. Functional ability and status Nutritional status Physical activity Advanced directives List of other physicians Hospitalizations, surgeries, and ER visits in previous 12 months Vitals Screenings to include cognitive, depression, and falls Referrals  and appointments  In addition, I have reviewed and discussed with patient certain preventive protocols, quality metrics, and best practice recommendations. A written personalized care plan for preventive services as well as general preventive health recommendations were provided to patient.     Nasario Badder, [facility] 03/18/2024

## 2024-03-18 NOTE — Patient Instructions (Addendum)
 You have an order for:  []   2D Mammogram  [x]   3D Mammogram  []   Bone Density     Please call for appointment:  [facility]  [address] [place] [address] [phone]   [facility] [address] [place], [place] [address] [phone] [facility] [address] [place], [place] [address] [phone]   Make sure to wear two-piece clothing.  No lotions, powders, or deodorants the day of the appointment. Make sure to bring picture ID and insurance card.  Bring list of medications you are currently taking including any supplements.   Schedule your [facility] screening mammogram through MyChart!   Log into your MyChart account.  Go to 'Visit' (or 'Appointments' if on mobile App) --> Schedule an Appointment  Under 'Select a Reason for Visit' choose the Mammogram Screening option.  Complete the pre-visit questions and select the time and place that best fits your schedule.    Ms. Adler , Thank you for taking time to come for your Medicare Wellness Visit. I appreciate your ongoing commitment to your health goals. Please review the following plan we discussed and let me know if I can assist you in the future.   These are the goals we discussed:  Goals       DIET - INCREASE WATER INTAKE (pt-stated)        This is a list of the screening recommended for you and due dates:  Health Maintenance  Topic Date Due   COVID-19 Vaccine (1) Never done   Eye exam for diabetics  Never done   Hepatitis C Screening  Never done   DTaP/Tdap/Td vaccine (1 - Tdap) Never done   Pneumonia Vaccine (1 of 2 - PCV) Never done   Zoster (Shingles) Vaccine (1 of 2) Never done   Mammogram  08/24/2023   DEXA scan (bone density measurement)  Never done   Screening for Lung Cancer  03/24/2024   Complete foot exam   04/24/2024   Flu Shot  05/08/2024   Hemoglobin  A1C  05/28/2024   Yearly kidney health urinalysis for diabetes  01/15/2025   Yearly kidney function blood test for diabetes  01/23/2025   Medicare Annual Wellness Visit  03/18/2025   Colon Cancer Screening  04/12/2031   HIV Screening  Completed   HPV Vaccine  Aged Out   Meningitis B Vaccine  Aged Out

## 2024-03-18 NOTE — Assessment & Plan Note (Signed)
 Requesting refills of trazodone . Would like to try higher dose. CTM.

## 2024-03-18 NOTE — Progress Notes (Signed)
 Subjective:    Amy Knapp is a 68 y.o. female who presents for a Welcome to Medicare exam.          Objective:     Today's Vitals   03/18/24 0808 03/18/24 0817  BP: (!) 151/84 (!) 158/84  Pulse: 63 60  Temp: 98.2 F (36.8 C)   TempSrc: Oral   SpO2: 93%   Weight: 146 lb 9.6 oz (66.5 kg)   Height: 5' 3 (1.6 m)   PainSc: 0-No pain   Body mass index is 25.97 kg/m.  Medications Outpatient Encounter Medications as of 03/18/2024  Medication Sig   albuterol  (PROVENTIL  HFA) 108 (90 Base) MCG/ACT inhaler Inhale 2 puffs into the lungs once every 6 (six) hours as needed for wheezing or shortness of breath.   allopurinol  (ZYLOPRIM ) 100 MG tablet Take 1 tablet (100 mg total) by mouth daily.   amLODipine (NORVASC) 5 MG tablet Take 1 tablet (5 mg total) by mouth daily.   atorvastatin  (LIPITOR) 10 MG tablet Take 1 tablet (10 mg total) by mouth daily.   Blood Pressure KIT 1 kit by Does not apply route daily.   fluticasone -salmeterol (WIXELA INHUB ) 250-50 MCG/ACT AEPB Inhale 1 puff into the lungs in the morning and at bedtime.   levothyroxine  (SYNTHROID ) 100 MCG tablet Take 1 tablet (100 mcg total) by mouth daily before breakfast.   loperamide  (IMODIUM  A-D) 2 MG tablet Take 1 tablet (2 mg total) by mouth at bedtime as needed for diarrhea or loose stools.   montelukast  (SINGULAIR ) 10 MG tablet Take 1 tablet (10 mg total) by mouth at bedtime.   olmesartan  (BENICAR ) 40 MG tablet Take 1 tablet (40 mg total) by mouth daily.   polycarbophil (FIBERCON) 625 MG tablet Take 1 tablet (625 mg total) by mouth daily.   Spacer/Aero-Holding Chambers (COMPACT SPACE CHAMBER) DEVI Use with albuterol    [DISCONTINUED] traZODone  (DESYREL ) 50 MG tablet Take 3 tablets (150 mg total) by mouth once daily at bedtime.   gabapentin  (NEURONTIN ) 400 MG capsule Take 1 capsule (400 mg total) by mouth 3 (three) times daily.   traZODone  (DESYREL ) 150 MG tablet Take 1-2 tablets (150-300 mg total) by mouth at  bedtime.   No facility-administered encounter medications on file as of 03/18/2024.     History: Past Medical History:  Diagnosis Date   Allergy    Anxiety    Arthritis    Asthma    Broken leg 2007   Right   Cataract    Complex renal cyst 06/24/2021   COPD (chronic obstructive pulmonary disease) (HCC)    Depression    Diabetes mellitus without complication (HCC)    Emphysema of lung (HCC)    Essential hypertension 04/20/2021   GERD (gastroesophageal reflux disease)    Gout    History of hiatal hernia    Hyperlipidemia    Hypothyroidism    Sleep apnea    Thyroid  disease    Past Surgical History:  Procedure Laterality Date   ABDOMINAL HYSTERECTOMY  2000   Partial   COLONOSCOPY WITH PROPOFOL  N/A 04/11/2021   Procedure: COLONOSCOPY WITH PROPOFOL ;  Surgeon: Marnee Sink, MD;  Location: [facility] ENDOSCOPY;  Service: Endoscopy;  Laterality: N/A;   ESOPHAGOGASTRODUODENOSCOPY N/A 04/11/2021   Procedure: ESOPHAGOGASTRODUODENOSCOPY (EGD);  Surgeon: Marnee Sink, MD;  Location: [facility] ENDOSCOPY;  Service: Endoscopy;  Laterality: N/A;   HERNIA REPAIR     INSERTION OF MESH  08/29/2021   Procedure: INSERTION OF MESH;  Surgeon: Alben Alma, MD;  Location:  [facility] ORS;  Service: General;;   LEG SURGERY Right 10/08/2005   broken right lower leg; plate and screws   PATELLA FRACTURE SURGERY Right    TUBAL LIGATION     XI ROBOTIC ASSISTED HIATAL HERNIA REPAIR N/A 08/29/2021   Procedure: XI ROBOTIC ASSISTED HIATAL HERNIA REPAIR, RNFA to assist;  Surgeon: Alben Alma, MD;  Location: [facility] ORS;  Service: General;  Laterality: N/A;    Family History  Problem Relation Age of Onset   Cancer Mother    Heart attack Father    Cancer Brother        colon   Heart attack Brother 8   Diabetes Daughter    Diabetes Son    Hypertension Maternal Aunt    Breast cancer Neg Hx    Social History   Occupational History   Occupation: Unemployed  Tobacco Use   Smoking status: Former    Current  packs/day: 0.00    Types: Cigarettes    Quit date: 04/09/2021    Years since quitting: 2.9   Smokeless tobacco: Former    Quit date: 1973   Tobacco comments:    Smoking 2 cigarettes/day    Patient dipped snuff and used smokeless tobacco when she was very young. Patient stopped at age 54.    Pack last 3 weeks        Quit a month ago. Feels good with quitting.   Vaping Use   Vaping status: Never Used  Substance and Sexual Activity   Alcohol use: No   Drug use: No   Sexual activity: Not Currently    Tobacco Counseling Counseling given: Not Answered Tobacco comments: Smoking 2 cigarettes/day Patient dipped snuff and used smokeless tobacco when she was very young. Patient stopped at age 63. Pack last 3 weeks  Quit a month ago. Feels good with quitting.    Immunizations and Health Maintenance Immunization History  Administered Date(s) Administered   Influenza Inj Mdck Quad Pf 11/13/2016   Influenza,inj,Quad PF,6+ Mos 07/16/2019   Health Maintenance Due  Topic Date Due   OPHTHALMOLOGY EXAM  Never done   Hepatitis C Screening  Never done   MAMMOGRAM  08/24/2023   DEXA SCAN  Never done   Lung Cancer Screening  03/24/2024    Activities of Daily Living    03/18/2024    8:11 AM  In your present state of health, do you have any difficulty performing the following activities:  Hearing? 0  Vision? 0  Difficulty concentrating or making decisions? 0  Walking or climbing stairs? 0  Dressing or bathing? 0  Doing errands, shopping? 0  Preparing Food and eating ? N  Using the Toilet? N  In the past six months, have you accidently leaked urine? N  Do you have problems with loss of bowel control? Y  Managing your Medications? N  Managing your Finances? N    Physical Exam   Physical Exam Constitutional:      Appearance: Normal appearance.  Pulmonary:     Effort: Pulmonary effort is normal.  Musculoskeletal:        General: Normal range of motion.  Skin:    Comments:  Normal skin color  Neurological:     General: No focal deficit present.     Mental Status: She is alert. Mental status is at baseline.  Psychiatric:        Mood and Affect: Mood normal.        Behavior: Behavior normal.  Thought Content: Thought content normal.    (optional), or other factors deemed appropriate based on the beneficiary's medical and social history and current clinical standards.   Advanced Directives: Does Patient Have a Medical Advance Directive?: No Would patient like information on creating a medical advance directive?: Yes (MAU/Ambulatory/Procedural Areas - Information given)  EKG:  normal EKG, normal sinus rhythm      Assessment:     This is a routine wellness examination for this patient   Vision/Hearing screen Hearing Screening   500Hz  1000Hz  2000Hz  4000Hz   Right ear 40 40 40 40  Left ear 40 40 40 40   Vision Screening   Right eye Left eye Both eyes  Without correction     With correction 20/20 20/20      Goals       DIET - INCREASE WATER INTAKE (pt-stated)        Depression Screen    03/18/2024    8:15 AM 01/24/2024    2:48 PM 01/16/2024    8:18 AM 12/13/2023    4:13 PM  PHQ 2/9 Scores  PHQ - 2 Score 1 0 0 0  PHQ- 9 Score 8 0 3 7     Fall Risk    03/18/2024    8:15 AM  Fall Risk   Number falls in past yr: 0  Injury with Fall? 0  Risk for fall due to : No Fall Risks  Follow up Falls evaluation completed    Cognitive Function:        03/18/2024    8:09 AM  6CIT Screen  What Year? 0 points  What month? 0 points  What time? 0 points  Count back from 20 0 points  Months in reverse 0 points  Repeat phrase 0 points  Total Score 0 points    Patient Care Team: Hadassah Letters, MD as PCP - General (Family Medicine) Burnie Cartwright, RN as Registered Nurse Arlette Benders, RN (Inactive) as Registered Nurse     Plan:    Advance Care Planning   A voluntary discussion about advance care planning including the  explanation and discussion of advance directives was extensively discussed  with the patient for 5 minutes with patient present.  Explanation about the health care proxy and Living will was reviewed and packet with forms with explanation of how to fill them out was given.  During this discussion, the patient was able to identify a health care proxy as  and plans/does not plan to fill out the paperwork required.  Patient was offered a separate Advance Care Planning visit for further assistance with forms.  Of note, she is interested in DNR status.    Welcome to Medicare preventive visit -     EKG 12-Lead  Primary hypertension Assessment & Plan: Outside goal. Plan to add amlodipine 5mg  daily to her regimen. Asymptomatic.   Orders: -     amLODIPine Besylate; Take 1 tablet (5 mg total) by mouth daily.  Dispense: 30 tablet; Refill: 3  Insomnia, unspecified type Assessment & Plan: Requesting refills of trazodone . Would like to try higher dose. CTM.  Orders: -     traZODone  HCl; Take 1-2 tablets (150-300 mg total) by mouth at bedtime.  Dispense: 180 tablet; Refill: 2  Type 2 diabetes mellitus with diabetic neuropathy, without long-term current use of insulin (HCC) Assessment & Plan: Due for eye exam.  Orders: -     Ambulatory referral to Ophthalmology  Encounter for screening mammogram for  malignant neoplasm of breast -     3D Screening Mammogram, Left and Right; Future  Need for vaccination Declines all today.  Screening for osteoporosis -     DG Bone Density; Future  Screening for lung cancer -     Ambulatory Referral for Lung Cancer Scre  Encounter for hepatitis C screening test for low risk patient -     Hepatitis C antibody  I have personally reviewed and noted the following in the patient's chart:   Medical and social history Use of alcohol, tobacco or illicit drugs  Current medications and supplements including opioid prescriptions. Patient is not currently taking opioid  prescriptions. Functional ability and status Nutritional status Physical activity Advanced directives List of other physicians Hospitalizations, surgeries, and ER visits in previous 12 months Vitals Screenings to include cognitive, depression, and falls Referrals and appointments  In addition, I have reviewed and discussed with patient certain preventive protocols, quality metrics, and best practice recommendations. A written personalized care plan for preventive services as well as general preventive health recommendations were provided to patient.     Hadassah Letters, MD 03/18/2024

## 2024-03-18 NOTE — Assessment & Plan Note (Signed)
 Outside goal. Plan to add amlodipine 5mg  daily to her regimen. Asymptomatic.

## 2024-03-18 NOTE — Assessment & Plan Note (Signed)
 Due for eye exam.

## 2024-03-19 ENCOUNTER — Ambulatory Visit: Payer: Self-pay | Admitting: Pediatrics

## 2024-03-19 ENCOUNTER — Other Ambulatory Visit: Payer: Self-pay

## 2024-03-19 LAB — HEPATITIS C ANTIBODY: Hep C Virus Ab: NONREACTIVE

## 2024-03-25 DIAGNOSIS — E119 Type 2 diabetes mellitus without complications: Secondary | ICD-10-CM | POA: Diagnosis not present

## 2024-03-25 DIAGNOSIS — H2513 Age-related nuclear cataract, bilateral: Secondary | ICD-10-CM | POA: Diagnosis not present

## 2024-03-26 ENCOUNTER — Telehealth: Payer: Self-pay | Admitting: Pediatrics

## 2024-03-26 NOTE — Telephone Encounter (Signed)
 Copied from CRM [id number]. Topic: Referral - Question >> Mar 26, 2024  2:43 PM Minus Amel G wrote: Reason for CRM: was wondering if she can change her referral for CT of the lungs to some where local. Patient is not willing to go to  and she asked about doing a diagnostic breat exam referral

## 2024-03-26 NOTE — Telephone Encounter (Signed)
 Copied from CRM [id number]. Topic: Appointments - Scheduling Inquiry for Clinic >> Mar 26, 2024  2:47 PM Stanly Early wrote: Reason for CRM: please call patient to set up an appt for bone density exam.

## 2024-04-06 ENCOUNTER — Telehealth: Payer: Self-pay | Admitting: Pediatrics

## 2024-04-06 NOTE — Telephone Encounter (Signed)
 Copied from CRM [id number]. Topic: Referral - Status >> Apr 06, 2024  2:32 PM Donee H wrote: Reason for CRM: Rea from [facility] called to check status of referral for a diagnostic mammogram for patient.  She states there is a order but its for screening. Patient is needing a diagnostic mammogram. Callback number [phone]

## 2024-04-08 ENCOUNTER — Other Ambulatory Visit: Payer: Self-pay | Admitting: Pediatrics

## 2024-04-08 DIAGNOSIS — Z1231 Encounter for screening mammogram for malignant neoplasm of breast: Secondary | ICD-10-CM

## 2024-04-08 NOTE — Progress Notes (Signed)
 Dx mammo placed per pt request.  Amy SHAUNNA Nett, MD

## 2024-04-08 NOTE — Telephone Encounter (Signed)
 Copied from CRM [id number]. Topic: Referral - Request for Referral >> Apr 08, 2024  2:14 PM Nathanel BROCKS wrote: Did the patient discuss referral with their provider in the last year? Yes (If No - schedule appointment) (If Yes - send message)  Appointment offered? No  Type of order/referral and detailed reason for visit: diagnostic mammogram and ultrasound  Preference of office, provider, location: [facility]  If referral order, have you been seen by this specialty before? Yes (If Yes, this issue or another issue? When? Where? Four years ago Can we respond through MyChart? No

## 2024-04-09 ENCOUNTER — Telehealth: Payer: Self-pay | Admitting: Pediatrics

## 2024-04-09 NOTE — Telephone Encounter (Signed)
 Called patient to inform her that the referral for Mammogram  was sent by provider and she stated that  she also needs a referral for [facility] imaging. Please advise

## 2024-04-09 NOTE — Telephone Encounter (Signed)
 Called patient to inform her and she also stated that she needs a  referral for  [place] imaging.

## 2024-04-10 ENCOUNTER — Emergency Department
Admission: EM | Admit: 2024-04-10 | Discharge: 2024-04-10 | Disposition: A | Discharge status: Home / Self Care | Attending: Emergency Medicine | Admitting: Emergency Medicine

## 2024-04-10 ENCOUNTER — Emergency Department

## 2024-04-10 ENCOUNTER — Other Ambulatory Visit: Payer: Self-pay

## 2024-04-10 DIAGNOSIS — J4489 Other specified chronic obstructive pulmonary disease: Secondary | ICD-10-CM | POA: Diagnosis not present

## 2024-04-10 DIAGNOSIS — E119 Type 2 diabetes mellitus without complications: Secondary | ICD-10-CM | POA: Diagnosis not present

## 2024-04-10 DIAGNOSIS — K8689 Other specified diseases of pancreas: Secondary | ICD-10-CM | POA: Diagnosis not present

## 2024-04-10 DIAGNOSIS — N390 Urinary tract infection, site not specified: Secondary | ICD-10-CM | POA: Diagnosis not present

## 2024-04-10 DIAGNOSIS — R109 Unspecified abdominal pain: Secondary | ICD-10-CM | POA: Diagnosis not present

## 2024-04-10 DIAGNOSIS — K5730 Diverticulosis of large intestine without perforation or abscess without bleeding: Secondary | ICD-10-CM | POA: Diagnosis not present

## 2024-04-10 LAB — CBC
HCT: 42.1 % (ref 36.0–46.0)
Hemoglobin: 13.6 g/dL (ref 12.0–15.0)
MCH: 29.2 pg (ref 26.0–34.0)
MCHC: 32.3 g/dL (ref 30.0–36.0)
MCV: 90.5 fL (ref 80.0–100.0)
Platelets: 229 K/uL (ref 150–400)
RBC: 4.65 MIL/uL (ref 3.87–5.11)
RDW: 11.7 % (ref 11.5–15.5)
WBC: 6.7 K/uL (ref 4.0–10.5)
nRBC: 0 % (ref 0.0–0.2)

## 2024-04-10 LAB — BASIC METABOLIC PANEL WITH GFR
Anion gap: 13 (ref 5–15)
BUN: 20 mg/dL (ref 8–23)
CO2: 21 mmol/L — ABNORMAL LOW (ref 22–32)
Calcium: 9.8 mg/dL (ref 8.9–10.3)
Chloride: 104 mmol/L (ref 98–111)
Creatinine, Ser: 0.72 mg/dL (ref 0.44–1.00)
GFR, Estimated: >60 mL/min (ref >60)
Glucose, Bld: 220 mg/dL — ABNORMAL HIGH (ref 70–99)
Potassium: 4 mmol/L (ref 3.5–5.1)
Sodium: 138 mmol/L (ref 135–145)

## 2024-04-10 LAB — URINALYSIS, ROUTINE W REFLEX MICROSCOPIC
Bilirubin Urine: NEGATIVE
Glucose, UA: NEGATIVE mg/dL
Ketones, ur: NEGATIVE mg/dL
Nitrite: NEGATIVE
Protein, ur: NEGATIVE mg/dL
Specific Gravity, Urine: 1.011 (ref 1.005–1.030)
pH: 5 (ref 5.0–8.0)

## 2024-04-10 MED ORDER — SULFAMETHOXAZOLE-TRIMETHOPRIM 800-160 MG PO TABS
1.0000 | ORAL_TABLET | Freq: Once | ORAL | Status: AC
  Administered 2024-04-10: 1 via ORAL
  Filled 2024-04-10: qty 1

## 2024-04-10 MED ORDER — KETOROLAC TROMETHAMINE 30 MG/ML IJ SOLN
30.0000 mg | Freq: Once | INTRAMUSCULAR | Status: AC
  Administered 2024-04-10: 30 mg via INTRAMUSCULAR
  Filled 2024-04-10: qty 1

## 2024-04-10 MED ORDER — PHENAZOPYRIDINE HCL 200 MG PO TABS
200.0000 mg | ORAL_TABLET | Freq: Once | ORAL | Status: AC
  Administered 2024-04-10: 200 mg via ORAL
  Filled 2024-04-10: qty 1

## 2024-04-10 MED ORDER — SULFAMETHOXAZOLE-TRIMETHOPRIM 800-160 MG PO TABS: 1.0000 | ORAL_TABLET | Freq: Two times a day (BID) | ORAL | 0 refills | Status: DC

## 2024-04-10 NOTE — ED Triage Notes (Signed)
 Pt to ED for burning with urination since 1 week, getting worse like my urethra's going to explode. When asked, endorses R flank pain also. Pt is able to sit still, skin dry.

## 2024-04-10 NOTE — Discharge Instructions (Addendum)
 Your evaluated in the ED for painful urination.  Your lab work is reassuring.  Your urinalysis reveals bacteria in which we will treat you with antibiotics.  A urine culture has been sent and is pending.  Please follow-up with your primary care provider in 1 week.

## 2024-04-10 NOTE — ED Provider Notes (Signed)
 [facility] Emergency Department Provider Note     Event Date/Time   First MD Initiated Contact with Patient 04/10/24 1824     (approximate)   History   Dysuria and Flank Pain   HPI  Amy Knapp is a 68 y.o. female with a history of COPD, diabetes mellitus, asthma, HLD, GERD presents to the ED for evaluation of burning with urination x 1 week.  Associated symptoms includes right flank pain noticed 2 days ago.  No history of kidney stone.  Believes this is a urinary tract infection as she has had this in the past and present with similar symptoms.  Denies fever, nausea, vomiting.    Physical Exam   Triage Vital Signs: ED Triage Vitals  Encounter Vitals Group     BP 04/10/24 1750 130/65     Girls Systolic BP Percentile --      Girls Diastolic BP Percentile --      Boys Systolic BP Percentile --      Boys Diastolic BP Percentile --      Pulse Rate 04/10/24 1750 87     Resp 04/10/24 1750 20     Temp 04/10/24 1750 98.3 F (36.8 C)     Temp Source 04/10/24 1750 Oral     SpO2 04/10/24 1750 94 %     Weight 04/10/24 1751 138 lb (62.6 kg)     Height --      Head Circumference --      Peak Flow --      Pain Score 04/10/24 1748 9     Pain Loc --      Pain Education --      Exclude from Growth Chart --     Most recent vital signs: Vitals:   04/10/24 1750  BP: 130/65  Pulse: 87  Resp: 20  Temp: 98.3 F (36.8 C)  SpO2: 94%   General Awake, no distress.  Nontoxic-appearing HEENT NCAT.  CV:  Good peripheral perfusion.  RESP:  Normal effort.  ABD:  No distention.  Soft and nontender.  Right CVA tenderness  ED Results / Procedures / Treatments   Labs (all labs ordered are listed, but only abnormal results are displayed) Labs Reviewed  URINALYSIS, ROUTINE W REFLEX MICROSCOPIC - Abnormal; Notable for the following components:      Result Value   Color, Urine YELLOW (*)    APPearance CLEAR (*)    Hgb urine dipstick SMALL (*)     Leukocytes,Ua SMALL (*)    Bacteria, UA RARE (*)    All other components within normal limits  BASIC METABOLIC PANEL WITH GFR - Abnormal; Notable for the following components:   CO2 21 (*)    Glucose, Bld 220 (*)    All other components within normal limits  URINE CULTURE  CBC    RADIOLOGY  I personally viewed and evaluated these images as part of my medical decision making, as well as reviewing the written report by the radiologist.  ED Provider Interpretation: No renal stone.  Bladder wall thickening.  CT Renal Stone Study Result Date: 04/10/2024 CLINICAL DATA:  Abdominal and flank pain with stone suspected. Burning with urination for 1 week. Right flank pain. EXAM: CT ABDOMEN AND PELVIS WITHOUT CONTRAST TECHNIQUE: Multidetector CT imaging of the abdomen and pelvis was performed following the standard protocol without IV contrast. RADIATION DOSE REDUCTION: This exam was performed according to the departmental dose-optimization program which includes automated exposure control, adjustment of the  mA and/or kV according to patient size and/or use of iterative reconstruction technique. COMPARISON:  CT abdomen and pelvis 04/27/2022. MRI abdomen 09/20/2021 FINDINGS: Lower chest: Lung bases are clear. Hepatobiliary: No focal liver abnormality is seen. No gallstones, gallbladder wall thickening, or biliary dilatation. Pancreas: Atrophic pancreas. No mass or inflammatory changes identified. Spleen: Normal in size without focal abnormality. Adrenals/Urinary Tract: No adrenal gland nodules. Large right renal cysts measuring up to 9 cm diameter. No change since previous studies. No imaging follow-up is indicated. No hydronephrosis or hydroureter. No renal, ureteral, or bladder stones. Bladder wall is thickened, possibly due to under distention or cystitis. Stomach/Bowel: Stomach, small bowel, and colon are not abnormally distended. No wall thickening or inflammatory stranding identified. Diverticulosis of the  sigmoid colon without evidence of acute diverticulitis. Appendix is normal. Vascular/Lymphatic: Aortic atherosclerosis. No enlarged abdominal or pelvic lymph nodes. Reproductive: Status post hysterectomy. No adnexal masses. Other: No abdominal wall hernia or abnormality. No abdominopelvic ascites. Musculoskeletal: No acute or significant osseous findings. IMPRESSION: 1. No renal or ureteral stone or obstruction. 2. Bladder wall thickening may be due to under distention or cystitis. 3. Aortic atherosclerosis. 4. Colonic diverticulosis without evidence of acute diverticulitis. Electronically Signed   By: Elsie Gravely M.D.   On: 04/10/2024 19:17    PROCEDURES:  Critical Care performed: No  Procedures   MEDICATIONS ORDERED IN ED: Medications  ketorolac  (TORADOL ) 30 MG/ML injection 30 mg (30 mg Intramuscular Given 04/10/24 1933)  phenazopyridine  (PYRIDIUM ) tablet 200 mg (200 mg Oral Given 04/10/24 1933)  sulfamethoxazole -trimethoprim  (BACTRIM  DS) 800-160 MG per tablet 1 tablet (1 tablet Oral Given 04/10/24 2003)     IMPRESSION / MDM / ASSESSMENT AND PLAN / ED COURSE  I reviewed the triage vital signs and the nursing notes.                              Clinical Course as of 04/10/24 2013  Fri Apr 10, 2024  1927 WBC: 6.7 No leukocytosis [MH]  1927 Hgb urine dipstick(!): SMALL [MH]  [address]): SMALL [MH]  1928 CT Renal Stone Study IMPRESSION: 1. No renal or ureteral stone or obstruction. 2. Bladder wall thickening may be due to under distention or cystitis. 3. Aortic atherosclerosis. 4. Colonic diverticulosis without evidence of acute diverticulitis.   [MH]    Clinical Course User Index [MH] Margrette Monte A, PA-C    68 y.o. female presents to the emergency department for evaluation and treatment of acute dysuria. See HPI for further details.   Differential diagnosis includes, but is not limited to UTI, pyelonephritis, nephrolithiasis  Patient's presentation is most  consistent with acute complicated illness / injury requiring diagnostic workup.  Patient is alert and oriented.  She is hemodynamically stable and afebrile.  Physical exam findings are stated above.  Lab work is reassuring.  No leukocytosis.  Urinalysis reveals small Hgb and small leukocytes.  Suspicion for nephrolithiasis especially given positive right CVA tenderness on physical exam.  Plan to obtain CT renal stone study.   CT renal stone is negative.  Does show bladder wall thickening indicating cystitis which is consistent with history and urinalysis.  Will treat with Bactrim  as patient has a penicillin allergy.  Urine cultures obtained and pending.  ED pain management with IM Toradol  and Pyridium .  Patient is made aware of discoloration of urine and reassurance provided.  Patient verbalized understanding.  She is in stable condition for discharge home.  ED  return precautions discussed.  Printed prescription given to patient at discharge.  FINAL CLINICAL IMPRESSION(S) / ED DIAGNOSES   Final diagnoses:  Lower urinary tract infectious disease   Rx / DC Orders   ED Discharge Orders          Ordered    sulfamethoxazole -trimethoprim  (BACTRIM  DS) 800-160 MG tablet  2 times daily        04/10/24 1948             Note:  This document was prepared using Dragon voice recognition software and may include unintentional dictation errors.    Margrette, Alcie Runions A, PA-C 04/10/24 2014    Floy Roberts, MD 04/10/24 2117

## 2024-04-12 LAB — URINE CULTURE: Culture: 60000 — AB

## 2024-04-13 ENCOUNTER — Other Ambulatory Visit: Payer: Self-pay | Admitting: Pediatrics

## 2024-04-13 ENCOUNTER — Other Ambulatory Visit: Payer: Self-pay

## 2024-04-13 ENCOUNTER — Encounter: Payer: Self-pay | Admitting: Pediatrics

## 2024-04-13 DIAGNOSIS — N6312 Unspecified lump in the right breast, upper inner quadrant: Secondary | ICD-10-CM

## 2024-04-13 DIAGNOSIS — E039 Hypothyroidism, unspecified: Secondary | ICD-10-CM

## 2024-04-13 DIAGNOSIS — N644 Mastodynia: Secondary | ICD-10-CM

## 2024-04-13 DIAGNOSIS — Z1231 Encounter for screening mammogram for malignant neoplasm of breast: Secondary | ICD-10-CM

## 2024-04-14 ENCOUNTER — Ambulatory Visit
Admission: RE | Admit: 2024-04-14 | Discharge: 2024-04-14 | Disposition: A | Discharge status: Home / Self Care | Source: Ambulatory Visit | Attending: Pediatrics | Admitting: Pediatrics

## 2024-04-14 ENCOUNTER — Other Ambulatory Visit: Payer: Self-pay

## 2024-04-14 DIAGNOSIS — N6312 Unspecified lump in the right breast, upper inner quadrant: Secondary | ICD-10-CM | POA: Insufficient documentation

## 2024-04-14 DIAGNOSIS — N644 Mastodynia: Secondary | ICD-10-CM | POA: Insufficient documentation

## 2024-04-14 DIAGNOSIS — Z1231 Encounter for screening mammogram for malignant neoplasm of breast: Secondary | ICD-10-CM | POA: Diagnosis not present

## 2024-04-14 DIAGNOSIS — R92323 Mammographic fibroglandular density, bilateral breasts: Secondary | ICD-10-CM | POA: Diagnosis not present

## 2024-04-14 DIAGNOSIS — N6311 Unspecified lump in the right breast, upper outer quadrant: Secondary | ICD-10-CM | POA: Diagnosis not present

## 2024-04-15 ENCOUNTER — Other Ambulatory Visit: Payer: Self-pay

## 2024-04-15 ENCOUNTER — Encounter: Payer: Self-pay | Admitting: Pediatrics

## 2024-04-15 ENCOUNTER — Ambulatory Visit: Admitting: Pediatrics

## 2024-04-15 VITALS — BP 116/71 | HR 65 | Temp 97.7°F | Wt 142.2 lb

## 2024-04-15 DIAGNOSIS — E039 Hypothyroidism, unspecified: Secondary | ICD-10-CM

## 2024-04-15 DIAGNOSIS — R0781 Pleurodynia: Secondary | ICD-10-CM

## 2024-04-15 DIAGNOSIS — E1140 Type 2 diabetes mellitus with diabetic neuropathy, unspecified: Secondary | ICD-10-CM | POA: Diagnosis not present

## 2024-04-15 DIAGNOSIS — J449 Chronic obstructive pulmonary disease, unspecified: Secondary | ICD-10-CM | POA: Diagnosis not present

## 2024-04-15 MED FILL — Levothyroxine Sodium Tab 100 MCG: ORAL | 30 days supply | Qty: 30 | Fill #0 | Status: AC

## 2024-04-15 NOTE — Progress Notes (Signed)
 Office Visit  BP 116/71   Pulse 65   Temp 97.7 F (36.5 C) (Oral)   Wt 142 lb 3.2 oz (64.5 kg)   SpO2 99%   BMI 25.19 kg/m    Subjective:    Patient ID: Amy Knapp, female    DOB: 08-30-1956, 68 y.o.   MRN: [id number]  HPI: Amy Knapp is a 68 y.o. female  Chief Complaint  Patient presents with   Hypothyroidism    Discussed the use of AI scribe software for clinical note transcription with the patient, who gave verbal consent to proceed.  History of Present Illness   Amy Knapp is a 68 year old female who presents for follow-up of thyroid  levels and A1c, and evaluation of back pain.  She experiences back pain located near her right lung, with intensity sometimes reaching eight or nine out of ten, occasionally requiring her to lie down. The pain worsens with breathing, and she has used her inhalers and nighttime breathing medication without significant relief. Over the past week, the pain persisted and was exacerbated by deep breaths, but in the past two days, the pain has not been as severe.  She has a history of a urinary tract infection for which she visited the emergency room on July 4th. The visit was at night, and she was able to receive treatment quickly, spending about two hours in the ER.  Her breathing is generally good, although hot weather can exacerbate her symptoms. She uses Wixela and albuterol  as rescue inhalers, along with Singulair , which she finds effective. She occasionally produces 'milky looking mucus' but denies any blood in her sputum.  She is awaiting her social security check to purchase groceries and has recently moved to a new residence, which she is pleased with. Her previous residence would have increased rent significantly, making her current situation more financially manageable.      Relevant past medical, surgical, family and social history reviewed and updated as indicated. Interim medical history since  our last visit reviewed. Allergies and medications reviewed and updated.  ROS per HPI unless specifically indicated above     Objective:    BP 116/71   Pulse 65   Temp 97.7 F (36.5 C) (Oral)   Wt 142 lb 3.2 oz (64.5 kg)   SpO2 99%   BMI 25.19 kg/m   Wt Readings from Last 3 Encounters:  04/15/24 142 lb 3.2 oz (64.5 kg)  04/10/24 138 lb (62.6 kg)  03/18/24 146 lb 9.6 oz (66.5 kg)     Physical Exam Constitutional:      Appearance: Normal appearance.  Pulmonary:     Effort: Pulmonary effort is normal.  Musculoskeletal:        General: Normal range of motion.  Skin:    Comments: Normal skin color  Neurological:     General: No focal deficit present.     Mental Status: She is alert. Mental status is at baseline.  Psychiatric:        Mood and Affect: Mood normal.        Behavior: Behavior normal.        Thought Content: Thought content normal.         04/15/2024    9:38 AM 03/18/2024    8:15 AM 01/24/2024    2:48 PM 01/16/2024    8:18 AM 12/13/2023    4:13 PM  Depression screen PHQ 2/9  Decreased Interest 0 1 0 0 0  Down, Depressed, Hopeless 0  0 0 0 0  PHQ - 2 Score 0 1 0 0 0  Altered sleeping 0 1 0 0 1  Tired, decreased energy 3 3 0 1 3  Change in appetite 3 2 0 2 3  Feeling bad or failure about yourself  0 0 0 0 0  Trouble concentrating 1 1 0 0 0  Moving slowly or fidgety/restless 0 0 0 0 0  Suicidal thoughts 0 0 0 0 0  PHQ-9 Score 7 8 0 3 7  Difficult doing work/chores  Not difficult at all Not difficult at all Not difficult at all        04/15/2024    9:38 AM 03/18/2024    8:16 AM 01/24/2024    2:49 PM 01/16/2024    8:18 AM  GAD 7 : Generalized Anxiety Score  Nervous, Anxious, on Edge 0 1 0 0  Control/stop worrying 0 1 0 1  Worry too much - different things 0 0 0 1  Trouble relaxing 3 2 0 0  Restless 2 1 0 0  Easily annoyed or irritable 2  0 0  Afraid - awful might happen 1 1 0 1  Total GAD 7 Score 8  0 3  Anxiety Difficulty  Not difficult at all Not  difficult at all Not difficult at all       Assessment & Plan:  Assessment & Plan   Hypothyroidism, unspecified type Assessment & Plan: Thyroid  function to be reassessed with lab work. - Order thyroid  function test. - Send levothyroxine  prescription to pharmacy. - Advise to hold off on picking up medication until lab results are reviewed.  Orders: -     Thyroid  Panel With TSH  Chronic obstructive pulmonary disease, unspecified COPD type (HCC) Pleuritic pain Assessment & Plan: Well-controlled with Wixela, Albuterol , and Singulair . Symptoms worsen in hot weather. Intermittent severe ?pleuritic pain on right posterior back, exacerbated by breathing. - Order chest X-ray. - Order CT for lung cancer screening. - Order lab test to rule out pulmonary embolism. - Continue Wixela, Albuterol , and Singulair . - Advise to report any increase in symptoms.  Orders: -     Ambulatory Referral for Lung Cancer Scre -     D-dimer, quantitative -     DG Chest 2 View; Future  Type 2 diabetes mellitus with diabetic neuropathy, without long-term current use of insulin (HCC) Assessment & Plan: Due for repeat A1c. Pending optho visit.   Orders: -     Hemoglobin A1c  Follow up plan: Return in about 3 months (around 07/16/2024) for Chronic illness f/u, HTN, DM.  Hadassah SHAUNNA Nett, MD

## 2024-04-15 NOTE — Patient Instructions (Signed)
 [facility] [address] [place],  [place]  [address]

## 2024-04-15 NOTE — Assessment & Plan Note (Signed)
 Well-controlled with Wixela, Albuterol , and Singulair . Symptoms worsen in hot weather. - Continue Wixela, Albuterol , and Singulair . - Advise to report any increase in symptoms.

## 2024-04-15 NOTE — Assessment & Plan Note (Signed)
 Thyroid  function to be reassessed with lab work. - Order thyroid  function test. - Send levothyroxine  prescription to pharmacy. - Advise to hold off on picking up medication until lab results are reviewed.

## 2024-04-15 NOTE — Telephone Encounter (Signed)
 Requested Prescriptions  Pending Prescriptions Disp Refills   levothyroxine  (SYNTHROID ) 100 MCG tablet 30 tablet 2    Sig: Take 1 tablet (100 mcg total) by mouth daily before breakfast.     Endocrinology:  Hypothyroid Agents Passed - 04/15/2024 11:18 AM      Passed - TSH in normal range and within 360 days    TSH  Date Value Ref Range Status  01/16/2024 0.787 0.450 - 4.500 uIU/mL Final         Passed - Valid encounter within last 12 months    Recent Outpatient Visits           Today Hypothyroidism, unspecified type   [place] [facility] Hadassah SQUIBB, MD   4 weeks ago [facility] to [employer] preventive visit   [place] [facility] Herold Hadassah SQUIBB, MD   2 months ago Full incontinence of feces   [place] [facility] Herold Hadassah SQUIBB, MD   3 months ago Hypothyroidism, unspecified type   [place] [facility] Herold Hadassah SQUIBB, MD   4 months ago Nonintractable headache, unspecified chronicity pattern, unspecified headache type    [facility] Herold Hadassah SQUIBB, MD

## 2024-04-15 NOTE — Assessment & Plan Note (Signed)
 Due for repeat A1c. Pending optho visit.

## 2024-04-16 ENCOUNTER — Ambulatory Visit: Payer: Self-pay | Admitting: Pediatrics

## 2024-04-16 ENCOUNTER — Ambulatory Visit
Admission: RE | Admit: 2024-04-16 | Discharge: 2024-04-16 | Disposition: A | Discharge status: Home / Self Care | Source: Ambulatory Visit | Attending: Pediatrics | Admitting: Pediatrics

## 2024-04-16 ENCOUNTER — Other Ambulatory Visit: Payer: Self-pay

## 2024-04-16 ENCOUNTER — Other Ambulatory Visit: Payer: Self-pay | Admitting: Pediatrics

## 2024-04-16 DIAGNOSIS — I700 Atherosclerosis of aorta: Secondary | ICD-10-CM | POA: Diagnosis not present

## 2024-04-16 DIAGNOSIS — R7989 Other specified abnormal findings of blood chemistry: Secondary | ICD-10-CM

## 2024-04-16 DIAGNOSIS — R0781 Pleurodynia: Secondary | ICD-10-CM | POA: Diagnosis not present

## 2024-04-16 DIAGNOSIS — R0602 Shortness of breath: Secondary | ICD-10-CM | POA: Diagnosis not present

## 2024-04-16 DIAGNOSIS — J439 Emphysema, unspecified: Secondary | ICD-10-CM | POA: Diagnosis not present

## 2024-04-16 LAB — THYROID PANEL WITH TSH
Free Thyroxine Index: 2.3 (ref 1.2–4.9)
T3 Uptake Ratio: 26 % (ref 24–39)
T4, Total: 8.9 ug/dL (ref 4.5–12.0)
TSH: 0.466 u[IU]/mL (ref 0.450–4.500)

## 2024-04-16 LAB — D-DIMER, QUANTITATIVE: D-DIMER: 0.63 mg{FEU}/L — ABNORMAL HIGH (ref 0.00–0.49)

## 2024-04-16 LAB — HEMOGLOBIN A1C
Est. average glucose Bld gHb Est-mCnc: 126 mg/dL
Hgb A1c MFr Bld: 6 % — ABNORMAL HIGH (ref 4.8–5.6)

## 2024-04-16 MED ORDER — IOHEXOL 350 MG/ML SOLN
75.0000 mL | Freq: Once | INTRAVENOUS | Status: AC | PRN
  Administered 2024-04-16: 75 mL via INTRAVENOUS

## 2024-04-16 NOTE — Progress Notes (Signed)
 Positive dimer borderline if age adjusted, pleuritic chest pain.   Intermediate risk, sending for stat CT PE.  Hadassah SHAUNNA Nett, MD

## 2024-04-17 NOTE — Telephone Encounter (Signed)
 Imaging orders have been send to facility as requested.

## 2024-04-21 DIAGNOSIS — Z13820 Encounter for screening for osteoporosis: Secondary | ICD-10-CM | POA: Diagnosis not present

## 2024-04-21 DIAGNOSIS — M85852 Other specified disorders of bone density and structure, left thigh: Secondary | ICD-10-CM | POA: Diagnosis not present

## 2024-04-21 DIAGNOSIS — J449 Chronic obstructive pulmonary disease, unspecified: Secondary | ICD-10-CM | POA: Diagnosis not present

## 2024-04-21 LAB — HM DEXA SCAN

## 2024-04-27 ENCOUNTER — Other Ambulatory Visit: Payer: Self-pay | Admitting: *Deleted

## 2024-04-29 ENCOUNTER — Encounter: Payer: Self-pay | Admitting: Pediatrics

## 2024-04-29 ENCOUNTER — Ambulatory Visit: Payer: Self-pay | Admitting: Pediatrics

## 2024-05-21 ENCOUNTER — Other Ambulatory Visit: Payer: Self-pay

## 2024-05-21 MED FILL — Levothyroxine Sodium Tab 100 MCG: ORAL | 30 days supply | Qty: 30 | Fill #1 | Status: AC

## 2024-05-27 DIAGNOSIS — Z6826 Body mass index (BMI) 26.0-26.9, adult: Secondary | ICD-10-CM | POA: Diagnosis not present

## 2024-05-27 DIAGNOSIS — R269 Unspecified abnormalities of gait and mobility: Secondary | ICD-10-CM | POA: Diagnosis not present

## 2024-05-27 DIAGNOSIS — E663 Overweight: Secondary | ICD-10-CM | POA: Diagnosis not present

## 2024-05-27 DIAGNOSIS — F325 Major depressive disorder, single episode, in full remission: Secondary | ICD-10-CM | POA: Diagnosis not present

## 2024-05-27 DIAGNOSIS — F17211 Nicotine dependence, cigarettes, in remission: Secondary | ICD-10-CM | POA: Diagnosis not present

## 2024-05-27 DIAGNOSIS — R002 Palpitations: Secondary | ICD-10-CM | POA: Diagnosis not present

## 2024-05-27 DIAGNOSIS — Z008 Encounter for other general examination: Secondary | ICD-10-CM | POA: Diagnosis not present

## 2024-06-10 ENCOUNTER — Other Ambulatory Visit: Payer: Self-pay

## 2024-06-10 ENCOUNTER — Other Ambulatory Visit: Payer: Self-pay | Admitting: Pediatrics

## 2024-06-10 DIAGNOSIS — J449 Chronic obstructive pulmonary disease, unspecified: Secondary | ICD-10-CM

## 2024-06-11 ENCOUNTER — Other Ambulatory Visit: Payer: Self-pay

## 2024-06-11 DIAGNOSIS — D485 Neoplasm of uncertain behavior of skin: Secondary | ICD-10-CM | POA: Diagnosis not present

## 2024-06-11 MED FILL — Montelukast Sodium Tab 10 MG (Base Equiv): ORAL | 90 days supply | Qty: 90 | Fill #0 | Status: AC

## 2024-06-11 NOTE — Telephone Encounter (Signed)
 Requested Prescriptions  Pending Prescriptions Disp Refills   montelukast  (SINGULAIR ) 10 MG tablet 90 tablet 0    Sig: Take 1 tablet (10 mg total) by mouth at bedtime.     Pulmonology:  Leukotriene Inhibitors Passed - 06/11/2024  8:24 AM      Passed - Valid encounter within last 12 months    Recent Outpatient Visits           1 month ago Hypothyroidism, unspecified type   [place] [facility] Herold Hadassah SQUIBB, MD   2 months ago [facility] preventive visit   [place] [facility] Herold Hadassah SQUIBB, MD   4 months ago Full incontinence of feces   [place] [facility] Herold Hadassah SQUIBB, MD   4 months ago Hypothyroidism, unspecified type   [place] [facility] Herold Hadassah SQUIBB, MD   6 months ago Nonintractable headache, unspecified chronicity pattern, unspecified headache type   [place] [facility] Herold Hadassah SQUIBB, MD

## 2024-06-23 ENCOUNTER — Ambulatory Visit: Payer: Medicaid Other | Admitting: Gerontology

## 2024-06-24 ENCOUNTER — Other Ambulatory Visit: Payer: Self-pay

## 2024-06-24 MED FILL — Levothyroxine Sodium Tab 100 MCG: ORAL | 30 days supply | Qty: 30 | Fill #2 | Status: AC

## 2024-06-25 ENCOUNTER — Other Ambulatory Visit: Payer: Self-pay

## 2024-06-25 DIAGNOSIS — D485 Neoplasm of uncertain behavior of skin: Secondary | ICD-10-CM | POA: Diagnosis not present

## 2024-06-25 MED ORDER — ERYTHROMYCIN 5 MG/GM OP OINT
TOPICAL_OINTMENT | OPHTHALMIC | 0 refills | Status: DC
  Filled 2024-06-25: qty 3.5, 14d supply, fill #0

## 2024-07-07 ENCOUNTER — Other Ambulatory Visit: Payer: Self-pay

## 2024-07-13 ENCOUNTER — Other Ambulatory Visit: Payer: Self-pay

## 2024-07-16 ENCOUNTER — Other Ambulatory Visit: Payer: Self-pay

## 2024-07-20 ENCOUNTER — Other Ambulatory Visit: Payer: Self-pay

## 2024-07-21 ENCOUNTER — Ambulatory Visit: Admitting: Pediatrics

## 2024-07-21 ENCOUNTER — Other Ambulatory Visit: Payer: Self-pay

## 2024-07-21 VITALS — BP 126/73 | HR 65 | Temp 97.8°F | Wt 143.6 lb

## 2024-07-21 DIAGNOSIS — Z23 Encounter for immunization: Secondary | ICD-10-CM

## 2024-07-21 DIAGNOSIS — E039 Hypothyroidism, unspecified: Secondary | ICD-10-CM

## 2024-07-21 DIAGNOSIS — J449 Chronic obstructive pulmonary disease, unspecified: Secondary | ICD-10-CM | POA: Diagnosis not present

## 2024-07-21 DIAGNOSIS — G4730 Sleep apnea, unspecified: Secondary | ICD-10-CM

## 2024-07-21 DIAGNOSIS — H60502 Unspecified acute noninfective otitis externa, left ear: Secondary | ICD-10-CM

## 2024-07-21 DIAGNOSIS — I10 Essential (primary) hypertension: Secondary | ICD-10-CM

## 2024-07-21 DIAGNOSIS — R5383 Other fatigue: Secondary | ICD-10-CM

## 2024-07-21 DIAGNOSIS — E1140 Type 2 diabetes mellitus with diabetic neuropathy, unspecified: Secondary | ICD-10-CM

## 2024-07-21 MED ORDER — AMLODIPINE BESYLATE 5 MG PO TABS
5.0000 mg | ORAL_TABLET | Freq: Every day | ORAL | 3 refills | Status: AC
  Filled 2024-07-21: qty 90, 90d supply, fill #0

## 2024-07-21 MED ORDER — OLMESARTAN MEDOXOMIL 40 MG PO TABS
40.0000 mg | ORAL_TABLET | Freq: Every day | ORAL | 3 refills | Status: AC
  Filled 2024-10-14: qty 90, 90d supply, fill #0

## 2024-07-21 MED ORDER — ACETIC ACID 2 % OT SOLN
4.0000 [drp] | OTIC | 0 refills | Status: AC
  Filled 2024-07-21: qty 15, 10d supply, fill #0

## 2024-07-21 NOTE — Assessment & Plan Note (Signed)
 Chronic intermittent morning congestion and occasional yellow sputum, likely weather-related. Exam reassuring, clear air sounds bilaterally not ill appearing. Continue chronic inhalers.  - Advise to report any worsening of symptoms.

## 2024-07-21 NOTE — Progress Notes (Addendum)
 Office Visit  BP 126/73   Pulse 65   Temp 97.8 F (36.6 C) (Oral)   Wt 143 lb 9.6 oz (65.1 kg)   SpO2 96%   BMI 25.44 kg/m    Subjective:    Patient ID: Amy Knapp, female    DOB: 1956/03/09, 68 y.o.   MRN: [id number]  HPI: Amy Knapp is a 68 y.o. female  Chief Complaint  Patient presents with   Hypothyroidism    Discussed the use of AI scribe software for clinical note transcription with the patient, who gave verbal consent to proceed.  History of Present Illness   Amy Knapp is a 68 year old female with COPD who presents with congestion and fatigue.  She experiences congestion primarily in the mornings, describing a 'stuffy' feeling upon waking that lasts for one to two hours. She associates these symptoms with weather changes. No fever or sluggishness. Occasionally, she coughs up yellowish sputum, although it sometimes appears clear the next day.  She reports persistent fatigue, feeling tired and lacking energy for an extended period. She is uncertain if this is related to her iron levels or another cause. She has a history of sleep apnea but is not currently using a CPAP machine, as it was lost during a move.  Her current medications include levothyroxine , for which she is low on refills, and a blood pressure medication, which also needs refilling. She is up to date on trazodone , Patanol, and Singulair .      Relevant past medical, surgical, family and social history reviewed and updated as indicated. Interim medical history since our last visit reviewed. Allergies and medications reviewed and updated.  ROS per HPI unless specifically indicated above     Objective:    BP 126/73   Pulse 65   Temp 97.8 F (36.6 C) (Oral)   Wt 143 lb 9.6 oz (65.1 kg)   SpO2 96%   BMI 25.44 kg/m   Wt Readings from Last 3 Encounters:  07/21/24 143 lb 9.6 oz (65.1 kg)  04/15/24 142 lb 3.2 oz (64.5 kg)  04/10/24 138 lb (62.6 kg)      Physical Exam Constitutional:      Appearance: Normal appearance.  HENT:     Right Ear: Tympanic membrane and ear canal normal.     Left Ear: Tenderness present.     Ears:     Comments: Erythematous canal on left Pulmonary:     Effort: Pulmonary effort is normal.  Musculoskeletal:        General: Normal range of motion.  Skin:    Comments: Normal skin color  Neurological:     General: No focal deficit present.     Mental Status: She is alert. Mental status is at baseline.  Psychiatric:        Mood and Affect: Mood normal.        Behavior: Behavior normal.        Thought Content: Thought content normal.         07/21/2024    9:26 AM 04/15/2024    9:38 AM 03/18/2024    8:15 AM 01/24/2024    2:48 PM 01/16/2024    8:18 AM  Depression screen PHQ 2/9  Decreased Interest 0 0 1 0 0  Down, Depressed, Hopeless 0 0 0 0 0  PHQ - 2 Score 0 0 1 0 0  Altered sleeping 0 0 1 0 0  Tired, decreased energy 3 3 3  0 1  Change  in appetite 1 3 2  0 2  Feeling bad or failure about yourself  0 0 0 0 0  Trouble concentrating 0 1 1 0 0  Moving slowly or fidgety/restless 0 0 0 0 0  Suicidal thoughts 0 0 0 0 0  PHQ-9 Score 4 7 8  0 3  Difficult doing work/chores   Not difficult at all Not difficult at all Not difficult at all       07/21/2024    9:26 AM 04/15/2024    9:38 AM 03/18/2024    8:16 AM 01/24/2024    2:49 PM  GAD 7 : Generalized Anxiety Score  Nervous, Anxious, on Edge 0 0 1 0  Control/stop worrying 1 0 1 0  Worry too much - different things 1 0 0 0  Trouble relaxing 1 3 2  0  Restless 0 2 1 0  Easily annoyed or irritable 0 2  0  Afraid - awful might happen 1 1 1  0  Total GAD 7 Score 4 8  0  Anxiety Difficulty   Not difficult at all Not difficult at all       Assessment & Plan:  Assessment & Plan   Chronic obstructive pulmonary disease, unspecified COPD type (HCC) Assessment & Plan: Chronic intermittent morning congestion and occasional yellow sputum, likely weather-related.  Exam reassuring, clear air sounds bilaterally not ill appearing. Continue chronic inhalers.  - Advise to report any worsening of symptoms.   Hypothyroidism, unspecified type Assessment & Plan: On levothyroxine  100mcg. Plan to repeat levels, will send refills pending levels.  Orders: -     Thyroid  Panel With TSH  Type 2 diabetes mellitus with diabetic neuropathy, without long-term current use of insulin (HCC) Diet controlled. Due for repeat blood work. Eye exam, foot exam pending (plan to do next visit). -     Hemoglobin A1c -     Comprehensive metabolic panel with GFR  Primary hypertension Assessment & Plan: Well controlled, requesting refills. Sent as below.   Orders: -     amLODIPine  Besylate; Take 1 tablet (5 mg total) by mouth daily.  Dispense: 90 tablet; Refill: 3 -     Olmesartan  Medoxomil; Take 1 tablet (40 mg total) by mouth daily.  Dispense: 90 tablet; Refill: 3  Other fatigue Chronic fatigue with differential including hypothyroidism, anemia, or untreated sleep apnea. - Order blood work including full panel for anemia and thyroid  function. -     CBC with Differential/Platelet -     Iron and TIBC -     Ferritin -     Vitamin B12  Immunization due -     Flu vaccine HIGH DOSE PF(Fluzone Trivalent)  Acute otitis externa of left ear, unspecified type Sent treatment as below.  -     Acetic Acid; Place 4 drops into the left ear every 3 (three) hours for 5 days.  Dispense: 15 mL; Refill: 0  Sleep apnea, unspecified type Assessment & Plan: Not using CPAP due to loss of device, potential fatigue contributor if blood work is normal. - Arrange for a new CPAP device.   Follow up plan: Return in about 3 months (around 10/21/2024) for DM, Chronic illness f/u.  Hadassah SHAUNNA Nett, MD

## 2024-07-21 NOTE — Assessment & Plan Note (Signed)
 On levothyroxine  100mcg. Plan to repeat levels, will send refills pending levels.

## 2024-07-21 NOTE — Assessment & Plan Note (Signed)
 Not using CPAP due to loss of device, potential fatigue contributor if blood work is normal. - Arrange for a new CPAP device.

## 2024-07-22 ENCOUNTER — Ambulatory Visit: Payer: Self-pay | Admitting: Pediatrics

## 2024-07-22 ENCOUNTER — Other Ambulatory Visit: Payer: Self-pay

## 2024-07-22 ENCOUNTER — Other Ambulatory Visit: Payer: Self-pay | Admitting: Pediatrics

## 2024-07-22 DIAGNOSIS — E039 Hypothyroidism, unspecified: Secondary | ICD-10-CM

## 2024-07-22 LAB — CBC WITH DIFFERENTIAL/PLATELET
Basophils Absolute: 0 x10E3/uL (ref 0.0–0.2)
Basos: 0 %
EOS (ABSOLUTE): 0.1 x10E3/uL (ref 0.0–0.4)
Eos: 1 %
Hematocrit: 42 % (ref 34.0–46.6)
Hemoglobin: 13.6 g/dL (ref 11.1–15.9)
Immature Grans (Abs): 0 x10E3/uL (ref 0.0–0.1)
Immature Granulocytes: 0 %
Lymphocytes Absolute: 1.1 x10E3/uL (ref 0.7–3.1)
Lymphs: 23 %
MCH: 29.8 pg (ref 26.6–33.0)
MCHC: 32.4 g/dL (ref 31.5–35.7)
MCV: 92 fL (ref 79–97)
Monocytes Absolute: 0.5 x10E3/uL (ref 0.1–0.9)
Monocytes: 10 %
Neutrophils Absolute: 3.1 x10E3/uL (ref 1.4–7.0)
Neutrophils: 66 %
Platelets: 237 x10E3/uL (ref 150–450)
RBC: 4.56 x10E6/uL (ref 3.77–5.28)
RDW: 12.6 % (ref 11.7–15.4)
WBC: 4.7 x10E3/uL (ref 3.4–10.8)

## 2024-07-22 LAB — THYROID PANEL WITH TSH
Free Thyroxine Index: 2.7 (ref 1.2–4.9)
T3 Uptake Ratio: 28 % (ref 24–39)
T4, Total: 9.8 ug/dL (ref 4.5–12.0)
TSH: 0.077 u[IU]/mL — ABNORMAL LOW (ref 0.450–4.500)

## 2024-07-22 LAB — COMPREHENSIVE METABOLIC PANEL WITH GFR
ALT: 12 IU/L (ref 0–32)
AST: 19 IU/L (ref 0–40)
Albumin: 4.2 g/dL (ref 3.9–4.9)
Alkaline Phosphatase: 84 IU/L (ref 49–135)
BUN/Creatinine Ratio: 6 — ABNORMAL LOW (ref 12–28)
BUN: 5 mg/dL — ABNORMAL LOW (ref 8–27)
Bilirubin Total: 0.3 mg/dL (ref 0.0–1.2)
CO2: 27 mmol/L (ref 20–29)
Calcium: 10 mg/dL (ref 8.7–10.3)
Chloride: 105 mmol/L (ref 96–106)
Creatinine, Ser: 0.77 mg/dL (ref 0.57–1.00)
Globulin, Total: 2.2 g/dL (ref 1.5–4.5)
Glucose: 106 mg/dL — ABNORMAL HIGH (ref 70–99)
Potassium: 4.5 mmol/L (ref 3.5–5.2)
Sodium: 144 mmol/L (ref 134–144)
Total Protein: 6.4 g/dL (ref 6.0–8.5)
eGFR: 86 mL/min/1.73 (ref >59)

## 2024-07-22 LAB — HEMOGLOBIN A1C
Est. average glucose Bld gHb Est-mCnc: 120 mg/dL
Hgb A1c MFr Bld: 5.8 % — ABNORMAL HIGH (ref 4.8–5.6)

## 2024-07-22 LAB — IRON AND TIBC
Iron Saturation: 29 % (ref 15–55)
Iron: 94 ug/dL (ref 27–139)
Total Iron Binding Capacity: 329 ug/dL (ref 250–450)
UIBC: 235 ug/dL (ref 118–369)

## 2024-07-22 LAB — FERRITIN: Ferritin: 126 ng/mL (ref 15–150)

## 2024-07-22 LAB — VITAMIN B12: Vitamin B-12: 423 pg/mL (ref 232–1245)

## 2024-07-22 MED ORDER — LEVOTHYROXINE SODIUM 88 MCG PO TABS
88.0000 ug | ORAL_TABLET | Freq: Every day | ORAL | 2 refills | Status: AC
  Filled 2024-07-22: qty 45, 45d supply, fill #0
  Filled 2024-09-08: qty 45, 45d supply, fill #1
  Filled 2024-10-29: qty 45, 45d supply, fill #2

## 2024-07-22 NOTE — Progress Notes (Signed)
 Sending synthroid  .  Amy SHAUNNA Nett, MD

## 2024-07-28 ENCOUNTER — Encounter: Payer: Self-pay | Admitting: Pediatrics

## 2024-07-28 NOTE — Assessment & Plan Note (Signed)
 Well controlled, requesting refills. Sent as below.

## 2024-08-14 ENCOUNTER — Ambulatory Visit: Admitting: Pediatrics

## 2024-08-14 ENCOUNTER — Encounter: Payer: Self-pay | Admitting: Pediatrics

## 2024-08-14 VITALS — BP 104/62 | HR 76 | Temp 98.6°F | Ht 63.0 in | Wt 139.6 lb

## 2024-08-14 DIAGNOSIS — L989 Disorder of the skin and subcutaneous tissue, unspecified: Secondary | ICD-10-CM

## 2024-08-14 DIAGNOSIS — E119 Type 2 diabetes mellitus without complications: Secondary | ICD-10-CM

## 2024-08-14 DIAGNOSIS — N949 Unspecified condition associated with female genital organs and menstrual cycle: Secondary | ICD-10-CM | POA: Diagnosis not present

## 2024-08-14 DIAGNOSIS — I10 Essential (primary) hypertension: Secondary | ICD-10-CM

## 2024-08-14 DIAGNOSIS — R399 Unspecified symptoms and signs involving the genitourinary system: Secondary | ICD-10-CM

## 2024-08-14 NOTE — Patient Instructions (Signed)
 Will call to schedule follow up depending on results

## 2024-08-14 NOTE — Progress Notes (Signed)
 Office Visit  BP 104/62   Pulse 76   Temp 98.6 F (37 C) (Oral)   Ht 5' 3 (1.6 m)   Wt 139 lb 9.6 oz (63.3 kg)   SpO2 98%   BMI 24.73 kg/m    Subjective:    Patient ID: Amy Knapp, female    DOB: September 16, 1956, 68 y.o.   MRN: [id number]  HPI: Amy Knapp is a 67 y.o. female  Chief Complaint  Patient presents with   Flank Pain    Right side.     Discussed the use of AI scribe software for clinical note transcription with the patient, who gave verbal consent to proceed.  History of Present Illness   Amy Knapp is a 68 year old female with diabetes and hypertension who presents with lightheadedness and back pain.  She experiences lightheadedness, particularly when standing, which began today. She nearly fell after getting out of her car. She attributes some symptoms to not having eaten due to limited food availability, which is concerning given her diabetes. She is currently taking amlodipine  for hypertension.  She has intermittent back pain, which she associates with previous findings of cysts on her kidneys. She has a history of urinary tract infections and describes the pain as similar to those episodes. The pain is localized to one side and is sometimes accompanied by a sensation similar to a shooting pain.  She mentions having moles on her back that are itchy and has not used any treatment for them. She also has cysts on her back that are bothersome.  She had a partial hysterectomy in the past but still experiences some lower abdominal pain. She expresses concern about her ovaries, as a family member had ovarian cancer after a similar procedure.  Her social history includes financial difficulties affecting her ability to purchase food, and she mentions that her rent was temporarily reduced due to reduced work hours. She is looking forward to receiving a Medicare card that will assist with groceries and utilities.     Relevant past  medical, surgical, family and social history reviewed and updated as indicated. Interim medical history since our last visit reviewed. Allergies and medications reviewed and updated.  ROS per HPI unless specifically indicated above     Objective:    BP 104/62   Pulse 76   Temp 98.6 F (37 C) (Oral)   Ht 5' 3 (1.6 m)   Wt 139 lb 9.6 oz (63.3 kg)   SpO2 98%   BMI 24.73 kg/m   Wt Readings from Last 3 Encounters:  08/14/24 139 lb 9.6 oz (63.3 kg)  07/21/24 143 lb 9.6 oz (65.1 kg)  04/15/24 142 lb 3.2 oz (64.5 kg)     Physical Exam Constitutional:      Appearance: Normal appearance.  Pulmonary:     Effort: Pulmonary effort is normal.  Musculoskeletal:        General: Normal range of motion.       Arms:     Comments: Tenderness in above area  Skin:    Findings: Lesion present. Papular rash: multiple epidermoid cysts in back as well as normal age related moles.     Comments: Normal skin color  Neurological:     General: No focal deficit present.     Mental Status: She is alert. Mental status is at baseline.  Psychiatric:        Mood and Affect: Mood normal.        Behavior: Behavior  normal.        Thought Content: Thought content normal.         07/21/2024    9:26 AM 04/15/2024    9:38 AM 03/18/2024    8:15 AM 01/24/2024    2:48 PM 01/16/2024    8:18 AM  Depression screen PHQ 2/9  Decreased Interest 0 0 1 0 0  Down, Depressed, Hopeless 0 0 0 0 0  PHQ - 2 Score 0 0 1 0 0  Altered sleeping 0 0 1 0 0  Tired, decreased energy 3 3 3  0 1  Change in appetite 1 3 2  0 2  Feeling bad or failure about yourself  0 0 0 0 0  Trouble concentrating 0 1 1 0 0  Moving slowly or fidgety/restless 0 0 0 0 0  Suicidal thoughts 0 0 0 0 0  PHQ-9 Score 4  7  8   0  3   Difficult doing work/chores   Not difficult at all Not difficult at all Not difficult at all     Data saved with a previous flowsheet row definition       07/21/2024    9:26 AM 04/15/2024    9:38 AM 03/18/2024    8:16  AM 01/24/2024    2:49 PM  GAD 7 : Generalized Anxiety Score  Nervous, Anxious, on Edge 0 0 1 0  Control/stop worrying 1 0 1 0  Worry too much - different things 1 0 0 0  Trouble relaxing 1 3 2  0  Restless 0 2 1 0  Easily annoyed or irritable 0 2  0  Afraid - awful might happen 1 1 1  0  Total GAD 7 Score 4 8  0  Anxiety Difficulty   Not difficult at all Not difficult at all       Assessment & Plan:  Assessment & Plan   Urinary tract infection symptoms Vaginal discomfort Intermittent back pain and urinary symptoms. Differential includes kidney stones or other renal pathology. Based on exam may also just be MSK pain vs CVA tenderness. - Ordered urinalysis and urine swab to check for blood or protein. - Ordered kidney function tests. - Consider renal ultrasound if urinalysis indicates abnormalities. -     Urine Culture -     Urinalysis, Routine w reflex microscopic -     Basic metabolic panel with GFR -     Microscopic Examination -     WET PREP FOR TRICH, YEAST, CLU  Diet-controlled diabetes mellitus (HCC) Assessment & Plan: Will schedule eye exam.   Primary hypertension Assessment & Plan: Lightheadedness possibly due to medication over-treatment from reduced food intake. - Held amlodipine  temporarily to assess for improvement in lightheadedness.  Skin lesion Itching from moles and cysts on the back. Lesions are not cancerous but bothersome. - Offered removal of cysts if bothersome. - Recommended over-the-counter Sarna cream for itchiness. - Discussed use of heating pad or ice for symptomatic relief.     Follow up plan: Return if symptoms worsen or fail to improve.  Hadassah SHAUNNA Nett, MD

## 2024-08-15 LAB — BASIC METABOLIC PANEL WITH GFR
BUN/Creatinine Ratio: 12 (ref 12–28)
BUN: 11 mg/dL (ref 8–27)
CO2: 24 mmol/L (ref 20–29)
Calcium: 9.8 mg/dL (ref 8.7–10.3)
Chloride: 103 mmol/L (ref 96–106)
Creatinine, Ser: 0.89 mg/dL (ref 0.57–1.00)
Glucose: 79 mg/dL (ref 70–99)
Potassium: 4.2 mmol/L (ref 3.5–5.2)
Sodium: 141 mmol/L (ref 134–144)
eGFR: 72 mL/min/1.73 (ref >59)

## 2024-08-15 LAB — MICROSCOPIC EXAMINATION

## 2024-08-15 LAB — WET PREP FOR TRICH, YEAST, CLUE
Clue Cell Exam: NEGATIVE
Trichomonas Exam: NEGATIVE
Yeast Exam: NEGATIVE

## 2024-08-15 LAB — URINALYSIS, ROUTINE W REFLEX MICROSCOPIC
Glucose, UA: NEGATIVE
Ketones, UA: NEGATIVE
Leukocytes,UA: NEGATIVE
Nitrite, UA: NEGATIVE
Specific Gravity, UA: 1.03 (ref 1.005–1.030)
Urobilinogen, Ur: 1 mg/dL (ref 0.2–1.0)
pH, UA: 6 (ref 5.0–7.5)

## 2024-08-18 ENCOUNTER — Ambulatory Visit: Payer: Self-pay | Admitting: Pediatrics

## 2024-08-18 DIAGNOSIS — N3000 Acute cystitis without hematuria: Secondary | ICD-10-CM

## 2024-08-19 ENCOUNTER — Encounter: Payer: Self-pay | Admitting: Pediatrics

## 2024-08-19 DIAGNOSIS — E119 Type 2 diabetes mellitus without complications: Secondary | ICD-10-CM | POA: Insufficient documentation

## 2024-08-19 NOTE — Assessment & Plan Note (Signed)
 Lightheadedness possibly due to medication over-treatment from reduced food intake. - Held amlodipine  temporarily to assess for improvement in lightheadedness.

## 2024-08-19 NOTE — Assessment & Plan Note (Signed)
 Will schedule eye exam.

## 2024-08-20 ENCOUNTER — Other Ambulatory Visit: Payer: Self-pay

## 2024-08-20 LAB — URINE CULTURE

## 2024-08-20 MED ORDER — CIPROFLOXACIN HCL 250 MG PO TABS
250.0000 mg | ORAL_TABLET | Freq: Two times a day (BID) | ORAL | 0 refills | Status: AC
  Filled 2024-08-20: qty 6, 3d supply, fill #0

## 2024-09-08 ENCOUNTER — Other Ambulatory Visit: Payer: Self-pay | Admitting: Pediatrics

## 2024-09-08 ENCOUNTER — Other Ambulatory Visit: Payer: Self-pay

## 2024-09-08 ENCOUNTER — Other Ambulatory Visit

## 2024-09-08 DIAGNOSIS — J449 Chronic obstructive pulmonary disease, unspecified: Secondary | ICD-10-CM

## 2024-09-08 DIAGNOSIS — E039 Hypothyroidism, unspecified: Secondary | ICD-10-CM

## 2024-09-09 ENCOUNTER — Ambulatory Visit: Payer: Self-pay | Admitting: Pediatrics

## 2024-09-09 DIAGNOSIS — E039 Hypothyroidism, unspecified: Secondary | ICD-10-CM

## 2024-09-09 LAB — THYROID PANEL WITH TSH
Free Thyroxine Index: 2.3 (ref 1.2–4.9)
T3 Uptake Ratio: 26 % (ref 24–39)
T4, Total: 9 ug/dL (ref 4.5–12.0)
TSH: 1.94 u[IU]/mL (ref 0.450–4.500)

## 2024-09-10 ENCOUNTER — Other Ambulatory Visit: Payer: Self-pay

## 2024-09-10 DIAGNOSIS — J449 Chronic obstructive pulmonary disease, unspecified: Secondary | ICD-10-CM

## 2024-09-10 NOTE — Telephone Encounter (Unsigned)
 Copied from CRM [id number]. Topic: Clinical - Lab/Test Results >> Sep 09, 2024  5:27 PM Shanda MATSU wrote: Reason for CRM: Patient called back in regards to call made to her, adv patient of the following info: Please call patient and let them know her thyroid  numbers look good, no changes needed to her synthroid  dose. I will send refills of her current dose of synthroid . Please ask which pharmacy she wants that sent to. Thanks!   Patient's preferred pharmacy, patient stated that she picked up refills today and that the only med she needs refilled is montelukast  (SINGULAIR ) 10 MG tablet [facility] [address] [place] [address] Phone: [phone] Fax: [phone]

## 2024-09-11 ENCOUNTER — Other Ambulatory Visit: Payer: Self-pay

## 2024-09-11 MED ORDER — MONTELUKAST SODIUM 10 MG PO TABS
10.0000 mg | ORAL_TABLET | Freq: Every day | ORAL | 0 refills | Status: AC
  Filled 2024-09-11: qty 90, 90d supply, fill #0

## 2024-10-14 ENCOUNTER — Other Ambulatory Visit: Payer: Self-pay

## 2024-10-16 ENCOUNTER — Other Ambulatory Visit: Payer: Self-pay

## 2024-10-21 ENCOUNTER — Ambulatory Visit: Admitting: Pediatrics

## 2024-10-29 ENCOUNTER — Other Ambulatory Visit: Payer: Self-pay

## 2024-11-07 NOTE — Patient Instructions (Incomplete)
 Cough in Adults: What It Means A cough helps to clear your throat and lungs. It may be a sign of an illness or another condition. A short-term (acute) cough may last 2-3 weeks. A long-term (chronic) cough may last 8 or more weeks. Many things can cause a cough. They include: Illnesses such as: An infection in your throat or lungs. Asthma or other heart or lung problems. Gastroesophageal reflux. This is when acid comes back up from your stomach. Breathing in things that bother (irritate) your lungs. Allergies. Postnasal drip. This is when mucus runs down the back of your throat. Smoking. Some medicines. Follow these instructions at home: Medicines Take over-the-counter and prescription medicines only as told by your doctor. Talk with your doctor before you take cough medicine (cough suppressants). Eating and drinking Do not drink alcohol. Do not drink caffeine. Drink enough fluid to keep your pee (urine) pale yellow. Lifestyle Stay away from cigarette smoke. Do not smoke or use any products that contain nicotine or tobacco. If you need help quitting, ask your doctor. Stay away from things that make you cough. These may include perfume, candles, cleaning products, or campfire smoke. General instructions  Watch for any changes to your cough. Tell your doctor about them. Always cover your mouth when you cough. If the air is dry in your home, use a cool mist vaporizer or humidifier. If your cough is worse at night, try using extra pillows to raise your head up higher while you sleep. Rest as needed. Contact a doctor if: You have new symptoms. Your symptoms get worse. You cough up pus. You have a fever that does not go away. Your cough does not get better after 2-3 weeks. Cough medicine does not help, and you are not sleeping well. You have pain that gets worse or is not helped with medicine. You are losing weight and do not know why. You have night sweats. Get help right away  if: You cough up blood. You have trouble breathing. Your heart is beating very fast. These symptoms may be an emergency. Get help right away. Call 911. Do not wait to see if the symptoms will go away. Do not drive yourself to the hospital. This information is not intended to replace advice given to you by your health care provider. Make sure you discuss any questions you have with your health care provider. Document Revised: 07/31/2024 Document Reviewed: 05/25/2022 Elsevier Patient Education  2025 [employer].

## 2024-11-09 ENCOUNTER — Ambulatory Visit: Admitting: Nurse Practitioner

## 2024-11-12 ENCOUNTER — Other Ambulatory Visit: Payer: Self-pay

## 2024-11-12 ENCOUNTER — Ambulatory Visit: Admitting: Nurse Practitioner

## 2024-11-12 ENCOUNTER — Encounter: Payer: Self-pay | Admitting: Nurse Practitioner

## 2024-11-12 VITALS — BP 117/70 | HR 75 | Temp 97.9°F | Ht 63.0 in | Wt 140.8 lb

## 2024-11-12 DIAGNOSIS — J4489 Other specified chronic obstructive pulmonary disease: Secondary | ICD-10-CM

## 2024-11-12 DIAGNOSIS — J441 Chronic obstructive pulmonary disease with (acute) exacerbation: Secondary | ICD-10-CM | POA: Insufficient documentation

## 2024-11-12 LAB — POC COVID19 BINAXNOW: SARS Coronavirus 2 Ag: NEGATIVE

## 2024-11-12 LAB — VERITOR FLU A/B WAIVED
Influenza A: NEGATIVE
Influenza B: NEGATIVE

## 2024-11-12 MED ORDER — FLUTICASONE-SALMETEROL 250-50 MCG/ACT IN AEPB
1.0000 | INHALATION_SPRAY | Freq: Two times a day (BID) | RESPIRATORY_TRACT | 1 refills | Status: AC
  Filled 2024-11-12: qty 60, 30d supply, fill #0

## 2024-11-12 MED ORDER — ALBUTEROL SULFATE HFA 108 (90 BASE) MCG/ACT IN AERS
2.0000 | INHALATION_SPRAY | Freq: Four times a day (QID) | RESPIRATORY_TRACT | 1 refills | Status: AC | PRN
  Filled 2024-11-12: qty 6.7, 25d supply, fill #0

## 2024-11-12 NOTE — Progress Notes (Signed)
 "  BP 117/70   Pulse 75   Temp 97.9 F (36.6 C) (Oral)   Ht 5' 3 (1.6 m)   Wt 140 lb 12.8 oz (63.9 kg)   SpO2 92%   BMI 24.94 kg/m    Subjective:    Patient ID: Amy Knapp, female    DOB: Jul 08, 1956, 69 y.o.   MRN: [id number]  HPI: Amy Knapp is a 69 y.o. female  Chief Complaint  Patient presents with   URI    Patient states she has been having cough, nasal and chest congestion, wheezing, and scratchy throat that started 2 days ago   UPPER RESPIRATORY TRACT INFECTION Started with symptoms two days ago. Has underlying COPD. Quit smoking 5 years ago. Uses Wixela daily and Albuterol  using more often while sick. October A1c 5.8%. Fever: no Cough: yes Shortness of breath: yes Wheezing: yes Chest pain: no Chest tightness: yes Chest congestion: yes Nasal congestion: yes Runny nose: yes Post nasal drip: yes Sneezing: no Sore throat: a little scratchy Swollen glands: no Sinus pressure: yes Headache: yes Face pain: no Toothache: no Ear pain: yes left Ear pressure: yes bilateral Eyes red/itching:no Eye drainage/crusting: no  Vomiting: no Rash: no Fatigue: yes Sick contacts: no Strep contacts: no  Context: fluctuating Recurrent sinusitis: no Relief with OTC cold/cough medications: no  Treatments attempted: Great Value cough drops    Relevant past medical, surgical, family and social history reviewed and updated as indicated. Interim medical history since our last visit reviewed. Allergies and medications reviewed and updated.  Review of Systems  Constitutional:  Positive for fatigue. Negative for activity change, appetite change, chills and fever.  HENT:  Positive for congestion, ear pain, postnasal drip, rhinorrhea, sinus pressure and sore throat. Negative for ear discharge, facial swelling, sinus pain, sneezing and voice change.   Eyes:  Negative for pain and visual disturbance.  Respiratory:  Positive for cough, chest tightness,  shortness of breath and wheezing.   Cardiovascular:  Negative for chest pain, palpitations and leg swelling.  Gastrointestinal: Negative.   Neurological:  Positive for headaches. Negative for dizziness and numbness.  Psychiatric/Behavioral: Negative.      Per HPI unless specifically indicated above     Objective:    BP 117/70   Pulse 75   Temp 97.9 F (36.6 C) (Oral)   Ht 5' 3 (1.6 m)   Wt 140 lb 12.8 oz (63.9 kg)   SpO2 92%   BMI 24.94 kg/m   Wt Readings from Last 3 Encounters:  11/12/24 140 lb 12.8 oz (63.9 kg)  08/14/24 139 lb 9.6 oz (63.3 kg)  07/21/24 143 lb 9.6 oz (65.1 kg)    Physical Exam Vitals and nursing note reviewed.  Constitutional:      General: She is awake. She is not in acute distress.    Appearance: She is well-developed and well-groomed. She is not ill-appearing or toxic-appearing.  HENT:     Head: Normocephalic.     Right Ear: Hearing, ear canal and external ear normal. A middle ear effusion is present. Tympanic membrane is not injected or perforated.     Left Ear: Hearing, ear canal and external ear normal. A middle ear effusion is present. Tympanic membrane is not injected or perforated.     Nose: Rhinorrhea present. Rhinorrhea is clear.     Right Sinus: Frontal sinus tenderness present. No maxillary sinus tenderness.     Left Sinus: Frontal sinus tenderness present. No maxillary sinus tenderness.  Mouth/Throat:     Mouth: Mucous membranes are moist.     Pharynx: Posterior oropharyngeal erythema (mild) and postnasal drip present. No pharyngeal swelling or oropharyngeal exudate.  Eyes:     General: Lids are normal.        Right eye: No discharge.        Left eye: No discharge.     Conjunctiva/sclera: Conjunctivae normal.     Pupils: Pupils are equal, round, and reactive to light.  Neck:     Thyroid : No thyromegaly.     Vascular: No carotid bruit.  Cardiovascular:     Rate and Rhythm: Normal rate and regular rhythm.     Heart sounds: Normal  heart sounds. No murmur heard.    No gallop.  Pulmonary:     Effort: Pulmonary effort is normal. No accessory muscle usage or respiratory distress.     Breath sounds: Wheezing (scattered expiratory wheezes noted throughout) present. No decreased breath sounds or rales.  Abdominal:     General: Bowel sounds are normal.     Palpations: Abdomen is soft. There is no hepatomegaly or splenomegaly.  Musculoskeletal:     Cervical back: Normal range of motion and neck supple.     Right lower leg: No edema.     Left lower leg: No edema.  Lymphadenopathy:     Head:     Right side of head: No submental, submandibular, tonsillar, preauricular or posterior auricular adenopathy.     Left side of head: No submental, submandibular, tonsillar, preauricular or posterior auricular adenopathy.     Cervical: No cervical adenopathy.  Skin:    General: Skin is warm and dry.  Neurological:     Mental Status: She is alert and oriented to person, place, and time.  Psychiatric:        Attention and Perception: Attention normal.        Mood and Affect: Mood normal.        Speech: Speech normal.        Behavior: Behavior normal. Behavior is cooperative.        Thought Content: Thought content normal.     Results for orders placed or performed in visit on 11/12/24  POC COVID-19   Collection Time: 11/12/24 11:47 AM  Result Value Ref Range   SARS Coronavirus 2 Ag Negative Negative      Assessment & Plan:   Problem List Items Addressed This Visit       Respiratory   COPD exacerbation (HCC) - Primary   Acute for a few days. Flu and Covid negative. Will start Doxycycline  BID for 7 days and Prednisone  40 MG daily for 5 days. Educated on this plan of care. Continue current inhaler regimen. Recommend: - Increased rest - Increasing Fluids - Acetaminophen  as needed for fever/pain.  - Salt water gargling, chloraseptic spray and throat lozenges - Mucinex .  - Saline sinus flushes or a neti pot.  - Humidifying  the air.       Relevant Medications   fluticasone -salmeterol (WIXELA INHUB ) 250-50 MCG/ACT AEPB   albuterol  (PROVENTIL  HFA) 108 (90 Base) MCG/ACT inhaler   Other Relevant Orders   POC COVID-19 (Completed)   Influenza A & B (STAT)   Other Visit Diagnoses       Chronic obstructive asthma (HCC)       Relevant Medications   fluticasone -salmeterol (WIXELA INHUB ) 250-50 MCG/ACT AEPB   albuterol  (PROVENTIL  HFA) 108 (90 Base) MCG/ACT inhaler        Follow up  plan: Return if symptoms worsen or fail to improve.      "

## 2024-11-12 NOTE — Patient Instructions (Signed)
 COPD Exacerbation Learn about triggers that can make COPD worse and how to avoid them. To view the content, go to this web address: [email or web address]  This video will expire on: 03/03/2026. If you need access to this video following this date, please reach out to the healthcare provider who assigned it to you. This information is not intended to replace advice given to you by your health care provider. Make sure you discuss any questions you have with your health care provider. Elsevier Patient Education  [employer].

## 2024-11-12 NOTE — Assessment & Plan Note (Signed)
 Acute for a few days. Flu and Covid negative. Will start Doxycycline  BID for 7 days and Prednisone  40 MG daily for 5 days. Educated on this plan of care. Continue current inhaler regimen. Recommend: - Increased rest - Increasing Fluids - Acetaminophen  as needed for fever/pain.  - Salt water gargling, chloraseptic spray and throat lozenges - Mucinex .  - Saline sinus flushes or a neti pot.  - Humidifying the air.

## 2024-11-13 ENCOUNTER — Telehealth: Payer: Self-pay | Admitting: Pediatrics

## 2024-11-13 ENCOUNTER — Other Ambulatory Visit: Payer: Self-pay

## 2024-11-13 NOTE — Telephone Encounter (Unsigned)
 Copied from CRM [id number]. Topic: Clinical - Medication Question >> Nov 13, 2024 10:34 AM Emylou G wrote: Reason for CRM: Patient called.. was seen yesterday was told she would get antibiotic and steriod but the pharmacy only had the inhalers.  Please review and contact patient

## 2024-11-13 NOTE — Telephone Encounter (Signed)
 patient called back stated provider forgot to call in her script for prednisone  and antibiotic. Please advise

## 2025-02-02 ENCOUNTER — Encounter: Admitting: Nurse Practitioner
